# Patient Record
Sex: Male | Born: 1955 | Race: White | Hispanic: No | Marital: Married | Smoking: Former smoker
Health system: Southern US, Community
[De-identification: ages and names within clinical notes are randomized; demographics above are authoritative.]

## PROBLEM LIST (undated history)

## (undated) DIAGNOSIS — I2510 Atherosclerotic heart disease of native coronary artery without angina pectoris: Secondary | ICD-10-CM

## (undated) DIAGNOSIS — Z9889 Other specified postprocedural states: Secondary | ICD-10-CM

## (undated) DIAGNOSIS — I10 Essential (primary) hypertension: Secondary | ICD-10-CM

## (undated) DIAGNOSIS — Z87442 Personal history of urinary calculi: Secondary | ICD-10-CM

## (undated) DIAGNOSIS — I213 ST elevation (STEMI) myocardial infarction of unspecified site: Secondary | ICD-10-CM

## (undated) DIAGNOSIS — K219 Gastro-esophageal reflux disease without esophagitis: Secondary | ICD-10-CM

## (undated) DIAGNOSIS — R112 Nausea with vomiting, unspecified: Secondary | ICD-10-CM

## (undated) DIAGNOSIS — E7800 Pure hypercholesterolemia, unspecified: Secondary | ICD-10-CM

## (undated) HISTORY — PX: GANGLION CYST EXCISION: SHX1691

## (undated) HISTORY — PX: INGUINAL HERNIA REPAIR: SUR1180

## (undated) HISTORY — PX: COLONOSCOPY: SHX174

---

## 2003-11-05 ENCOUNTER — Ambulatory Visit: Payer: Self-pay | Admitting: Family Medicine

## 2005-05-01 ENCOUNTER — Encounter (INDEPENDENT_AMBULATORY_CARE_PROVIDER_SITE_OTHER): Payer: Self-pay | Admitting: Cardiology

## 2005-05-01 ENCOUNTER — Ambulatory Visit (HOSPITAL_COMMUNITY): Admission: RE | Admit: 2005-05-01 | Discharge: 2005-05-01 | Payer: Self-pay

## 2008-01-09 HISTORY — PX: OTHER SURGICAL HISTORY: SHX169

## 2009-12-20 ENCOUNTER — Emergency Department (HOSPITAL_BASED_OUTPATIENT_CLINIC_OR_DEPARTMENT_OTHER)
Admission: EM | Admit: 2009-12-20 | Discharge: 2009-12-20 | Payer: Self-pay | Source: Home / Self Care | Admitting: General Surgery

## 2010-03-21 LAB — BASIC METABOLIC PANEL
BUN: 19 mg/dL (ref 6–23)
CO2: 22 mEq/L (ref 19–32)
GFR calc non Af Amer: 53 mL/min — ABNORMAL LOW (ref >60)
Glucose, Bld: 99 mg/dL (ref 70–99)
Potassium: 3.6 mEq/L (ref 3.5–5.1)

## 2010-03-21 LAB — URINALYSIS, ROUTINE W REFLEX MICROSCOPIC
Bilirubin Urine: NEGATIVE
Glucose, UA: NEGATIVE mg/dL
Ketones, ur: 15 mg/dL — AB
Nitrite: NEGATIVE
pH: 5.5 (ref 5.0–8.0)

## 2010-03-21 LAB — URINE MICROSCOPIC-ADD ON

## 2011-04-08 ENCOUNTER — Emergency Department: Payer: Self-pay | Admitting: *Deleted

## 2011-04-08 LAB — CBC
HCT: 47.1 % (ref 40.0–52.0)
HGB: 16.1 g/dL (ref 13.0–18.0)
MCHC: 34.3 g/dL (ref 32.0–36.0)
MCV: 91 fL (ref 80–100)
Platelet: 236 10*3/uL (ref 150–440)
RBC: 5.17 10*6/uL (ref 4.40–5.90)
RDW: 12.9 % (ref 11.5–14.5)
WBC: 7.9 10*3/uL (ref 3.8–10.6)

## 2011-04-08 LAB — URINALYSIS, COMPLETE
Glucose,UR: NEGATIVE mg/dL (ref 0–75)
Leukocyte Esterase: NEGATIVE
Nitrite: NEGATIVE
Ph: 6 (ref 4.5–8.0)
Protein: 25
RBC,UR: 295 /HPF (ref 0–5)
Squamous Epithelial: NONE SEEN

## 2011-04-08 LAB — BASIC METABOLIC PANEL
Anion Gap: 8 (ref 7–16)
BUN: 22 mg/dL — ABNORMAL HIGH (ref 7–18)
Chloride: 104 mmol/L (ref 98–107)
Co2: 28 mmol/L (ref 21–32)
EGFR (African American): >60
EGFR (Non-African Amer.): >60
Glucose: 96 mg/dL (ref 65–99)

## 2011-04-09 ENCOUNTER — Emergency Department: Payer: Self-pay | Admitting: Emergency Medicine

## 2011-04-09 LAB — LIPASE, BLOOD: Lipase: 62 U/L — ABNORMAL LOW (ref 73–393)

## 2011-04-09 LAB — COMPREHENSIVE METABOLIC PANEL
Albumin: 4.2 g/dL (ref 3.4–5.0)
Alkaline Phosphatase: 87 U/L (ref 50–136)
Anion Gap: 14 (ref 7–16)
BUN: 25 mg/dL — ABNORMAL HIGH (ref 7–18)
Chloride: 103 mmol/L (ref 98–107)
Co2: 22 mmol/L (ref 21–32)
EGFR (African American): 46 — ABNORMAL LOW
SGPT (ALT): 47 U/L
Sodium: 139 mmol/L (ref 136–145)

## 2011-04-09 LAB — CBC
HCT: 45.5 % (ref 40.0–52.0)
RBC: 5.02 10*6/uL (ref 4.40–5.90)
RDW: 12.9 % (ref 11.5–14.5)
WBC: 15.5 10*3/uL — ABNORMAL HIGH (ref 3.8–10.6)

## 2011-04-11 ENCOUNTER — Emergency Department: Payer: Self-pay

## 2011-04-11 LAB — URINALYSIS, COMPLETE
Bacteria: NONE SEEN
Glucose,UR: NEGATIVE mg/dL (ref 0–75)
Leukocyte Esterase: NEGATIVE
Nitrite: NEGATIVE
Ph: 6 (ref 4.5–8.0)
Protein: NEGATIVE
RBC,UR: 8 /HPF (ref 0–5)
Squamous Epithelial: NONE SEEN
WBC UR: 1 /HPF (ref 0–5)

## 2011-04-11 LAB — BASIC METABOLIC PANEL
Anion Gap: 12 (ref 7–16)
Calcium, Total: 9.1 mg/dL (ref 8.5–10.1)
Chloride: 104 mmol/L (ref 98–107)
Co2: 21 mmol/L (ref 21–32)
Creatinine: 1.44 mg/dL — ABNORMAL HIGH (ref 0.60–1.30)
EGFR (African American): >60
Glucose: 96 mg/dL (ref 65–99)

## 2012-05-22 ENCOUNTER — Other Ambulatory Visit: Payer: Self-pay | Admitting: *Deleted

## 2016-01-12 ENCOUNTER — Encounter (HOSPITAL_COMMUNITY): Payer: Self-pay | Admitting: *Deleted

## 2016-01-12 ENCOUNTER — Inpatient Hospital Stay (HOSPITAL_COMMUNITY)
Admission: EM | Admit: 2016-01-12 | Discharge: 2016-01-18 | DRG: 246 | Disposition: A | Discharge status: Home / Self Care | Payer: Managed Care, Other (non HMO) | Attending: Cardiovascular Disease | Admitting: Cardiovascular Disease

## 2016-01-12 ENCOUNTER — Encounter (HOSPITAL_COMMUNITY)
Admission: EM | Disposition: A | Discharge status: Home / Self Care | Payer: Self-pay | Attending: Cardiovascular Disease

## 2016-01-12 DIAGNOSIS — R945 Abnormal results of liver function studies: Secondary | ICD-10-CM

## 2016-01-12 DIAGNOSIS — I2510 Atherosclerotic heart disease of native coronary artery without angina pectoris: Secondary | ICD-10-CM | POA: Diagnosis not present

## 2016-01-12 DIAGNOSIS — I237 Postinfarction angina: Secondary | ICD-10-CM

## 2016-01-12 DIAGNOSIS — I5041 Acute combined systolic (congestive) and diastolic (congestive) heart failure: Secondary | ICD-10-CM | POA: Diagnosis not present

## 2016-01-12 DIAGNOSIS — I255 Ischemic cardiomyopathy: Secondary | ICD-10-CM | POA: Diagnosis present

## 2016-01-12 DIAGNOSIS — Z955 Presence of coronary angioplasty implant and graft: Secondary | ICD-10-CM

## 2016-01-12 DIAGNOSIS — I4891 Unspecified atrial fibrillation: Secondary | ICD-10-CM | POA: Diagnosis not present

## 2016-01-12 DIAGNOSIS — Z6832 Body mass index (BMI) 32.0-32.9, adult: Secondary | ICD-10-CM | POA: Diagnosis not present

## 2016-01-12 DIAGNOSIS — Z79899 Other long term (current) drug therapy: Secondary | ICD-10-CM

## 2016-01-12 DIAGNOSIS — I219 Acute myocardial infarction, unspecified: Secondary | ICD-10-CM | POA: Diagnosis not present

## 2016-01-12 DIAGNOSIS — I2542 Coronary artery dissection: Secondary | ICD-10-CM | POA: Diagnosis not present

## 2016-01-12 DIAGNOSIS — Z880 Allergy status to penicillin: Secondary | ICD-10-CM | POA: Diagnosis not present

## 2016-01-12 DIAGNOSIS — E876 Hypokalemia: Secondary | ICD-10-CM | POA: Diagnosis present

## 2016-01-12 DIAGNOSIS — I10 Essential (primary) hypertension: Secondary | ICD-10-CM | POA: Diagnosis present

## 2016-01-12 DIAGNOSIS — R7989 Other specified abnormal findings of blood chemistry: Secondary | ICD-10-CM | POA: Diagnosis present

## 2016-01-12 DIAGNOSIS — I2111 ST elevation (STEMI) myocardial infarction involving right coronary artery: Secondary | ICD-10-CM | POA: Diagnosis present

## 2016-01-12 DIAGNOSIS — Z7982 Long term (current) use of aspirin: Secondary | ICD-10-CM | POA: Diagnosis not present

## 2016-01-12 DIAGNOSIS — I110 Hypertensive heart disease with heart failure: Secondary | ICD-10-CM | POA: Diagnosis present

## 2016-01-12 DIAGNOSIS — E785 Hyperlipidemia, unspecified: Secondary | ICD-10-CM | POA: Diagnosis not present

## 2016-01-12 DIAGNOSIS — I25110 Atherosclerotic heart disease of native coronary artery with unstable angina pectoris: Secondary | ICD-10-CM | POA: Diagnosis present

## 2016-01-12 DIAGNOSIS — K219 Gastro-esophageal reflux disease without esophagitis: Secondary | ICD-10-CM | POA: Diagnosis present

## 2016-01-12 DIAGNOSIS — I2119 ST elevation (STEMI) myocardial infarction involving other coronary artery of inferior wall: Secondary | ICD-10-CM | POA: Diagnosis present

## 2016-01-12 DIAGNOSIS — I213 ST elevation (STEMI) myocardial infarction of unspecified site: Secondary | ICD-10-CM

## 2016-01-12 DIAGNOSIS — E669 Obesity, unspecified: Secondary | ICD-10-CM

## 2016-01-12 DIAGNOSIS — R079 Chest pain, unspecified: Secondary | ICD-10-CM | POA: Diagnosis present

## 2016-01-12 HISTORY — DX: Gastro-esophageal reflux disease without esophagitis: K21.9

## 2016-01-12 HISTORY — DX: Other specified postprocedural states: Z98.890

## 2016-01-12 HISTORY — DX: Atherosclerotic heart disease of native coronary artery without angina pectoris: I25.10

## 2016-01-12 HISTORY — DX: ST elevation (STEMI) myocardial infarction of unspecified site: I21.3

## 2016-01-12 HISTORY — PX: CARDIAC CATHETERIZATION: SHX172

## 2016-01-12 HISTORY — DX: Pure hypercholesterolemia, unspecified: E78.00

## 2016-01-12 HISTORY — DX: Essential (primary) hypertension: I10

## 2016-01-12 HISTORY — DX: Other specified postprocedural states: R11.2

## 2016-01-12 HISTORY — DX: Personal history of urinary calculi: Z87.442

## 2016-01-12 LAB — COMPREHENSIVE METABOLIC PANEL
ALBUMIN: 4.3 g/dL (ref 3.5–5.0)
ALK PHOS: 91 U/L (ref 38–126)
ALT: 46 U/L (ref 17–63)
ANION GAP: 15 (ref 5–15)
AST: 37 U/L (ref 15–41)
BILIRUBIN TOTAL: 1.2 mg/dL (ref 0.3–1.2)
BUN: 15 mg/dL (ref 6–20)
CALCIUM: 9.6 mg/dL (ref 8.9–10.3)
CO2: 20 mmol/L — AB (ref 22–32)
Chloride: 103 mmol/L (ref 101–111)
Creatinine, Ser: 1.26 mg/dL — ABNORMAL HIGH (ref 0.61–1.24)
GFR calc Af Amer: >60 mL/min (ref >60)
GFR calc non Af Amer: >60 mL/min (ref >60)
GLUCOSE: 128 mg/dL — AB (ref 65–99)
Potassium: 3.1 mmol/L — ABNORMAL LOW (ref 3.5–5.1)
SODIUM: 138 mmol/L (ref 135–145)
TOTAL PROTEIN: 7.3 g/dL (ref 6.5–8.1)

## 2016-01-12 LAB — TROPONIN I
Troponin I: 0.24 ng/mL (ref <0.03)
Troponin I: >65 ng/mL (ref <0.03)

## 2016-01-12 LAB — POCT I-STAT, CHEM 8
BUN: 16 mg/dL (ref 6–20)
CREATININE: 1.1 mg/dL (ref 0.61–1.24)
Calcium, Ion: 1.03 mmol/L — ABNORMAL LOW (ref 1.15–1.40)
Chloride: 104 mmol/L (ref 101–111)
GLUCOSE: 135 mg/dL — AB (ref 65–99)
HEMATOCRIT: 51 % (ref 39.0–52.0)
HEMOGLOBIN: 17.3 g/dL — AB (ref 13.0–17.0)
Potassium: 3 mmol/L — ABNORMAL LOW (ref 3.5–5.1)
Sodium: 141 mmol/L (ref 135–145)
TCO2: 21 mmol/L (ref 0–100)

## 2016-01-12 LAB — POCT I-STAT TROPONIN I: TROPONIN I, POC: 0.26 ng/mL — AB (ref 0.00–0.08)

## 2016-01-12 LAB — DIFFERENTIAL
BASOS PCT: 0 %
Basophils Absolute: 0 10*3/uL (ref 0.0–0.1)
EOS PCT: 1 %
Eosinophils Absolute: 0.2 10*3/uL (ref 0.0–0.7)
LYMPHS PCT: 30 %
Lymphs Abs: 3.5 10*3/uL (ref 0.7–4.0)
MONO ABS: 0.9 10*3/uL (ref 0.1–1.0)
MONOS PCT: 8 %
NEUTROS ABS: 7.2 10*3/uL (ref 1.7–7.7)
Neutrophils Relative %: 61 %

## 2016-01-12 LAB — CBC
HEMATOCRIT: 48.5 % (ref 39.0–52.0)
Hemoglobin: 17.6 g/dL — ABNORMAL HIGH (ref 13.0–17.0)
MCH: 30.6 pg (ref 26.0–34.0)
MCHC: 36.3 g/dL — ABNORMAL HIGH (ref 30.0–36.0)
MCV: 84.3 fL (ref 78.0–100.0)
PLATELETS: 262 10*3/uL (ref 150–400)
RBC: 5.75 MIL/uL (ref 4.22–5.81)
RDW: 12.7 % (ref 11.5–15.5)
WBC: 11.7 10*3/uL — AB (ref 4.0–10.5)

## 2016-01-12 LAB — PROTIME-INR
INR: 0.9
PROTHROMBIN TIME: 12.2 s (ref 11.4–15.2)

## 2016-01-12 LAB — POCT ACTIVATED CLOTTING TIME: ACTIVATED CLOTTING TIME: 147 s

## 2016-01-12 LAB — LIPID PANEL
Cholesterol: 266 mg/dL — ABNORMAL HIGH (ref 0–200)
HDL: 47 mg/dL (ref >40)
LDL CALC: 182 mg/dL — AB (ref 0–99)
Total CHOL/HDL Ratio: 5.7 RATIO
Triglycerides: 183 mg/dL — ABNORMAL HIGH (ref <150)
VLDL: 37 mg/dL (ref 0–40)

## 2016-01-12 LAB — MRSA PCR SCREENING: MRSA by PCR: NEGATIVE

## 2016-01-12 LAB — CG4 I-STAT (LACTIC ACID): Lactic Acid, Venous: 4.26 mmol/L (ref 0.5–1.9)

## 2016-01-12 LAB — MAGNESIUM: MAGNESIUM: 1.8 mg/dL (ref 1.7–2.4)

## 2016-01-12 LAB — TSH: TSH: 0.416 u[IU]/mL (ref 0.350–4.500)

## 2016-01-12 LAB — APTT: aPTT: 30 seconds (ref 24–36)

## 2016-01-12 SURGERY — LEFT HEART CATH AND CORONARY ANGIOGRAPHY
Anesthesia: LOCAL

## 2016-01-12 MED ORDER — FENTANYL CITRATE (PF) 100 MCG/2ML IJ SOLN
100.0000 ug | Freq: Once | INTRAMUSCULAR | Status: AC
  Administered 2016-01-12: 100 ug via INTRAVENOUS

## 2016-01-12 MED ORDER — MIDAZOLAM HCL 2 MG/2ML IJ SOLN
INTRAMUSCULAR | Status: DC | PRN
  Administered 2016-01-12: 1 mg via INTRAVENOUS

## 2016-01-12 MED ORDER — BIVALIRUDIN BOLUS VIA INFUSION - CUPID
INTRAVENOUS | Status: DC | PRN
  Administered 2016-01-12: 85.05 mg via INTRAVENOUS

## 2016-01-12 MED ORDER — ASPIRIN 81 MG PO CHEW
81.0000 mg | CHEWABLE_TABLET | Freq: Every day | ORAL | Status: DC
  Administered 2016-01-13 – 2016-01-18 (×5): 81 mg via ORAL
  Filled 2016-01-12 (×7): qty 1

## 2016-01-12 MED ORDER — METOPROLOL TARTRATE 5 MG/5ML IV SOLN
INTRAVENOUS | Status: AC
  Filled 2016-01-12: qty 5

## 2016-01-12 MED ORDER — SODIUM CHLORIDE 0.9 % IV SOLN: 1.7500 mg/kg/h | Freq: Once | INTRAVENOUS | Status: DC

## 2016-01-12 MED ORDER — AMIODARONE LOAD VIA INFUSION
150.0000 mg | Freq: Once | INTRAVENOUS | Status: AC
  Administered 2016-01-12: 150 mg via INTRAVENOUS
  Filled 2016-01-12: qty 83.34

## 2016-01-12 MED ORDER — NITROGLYCERIN 0.4 MG SL SUBL
0.4000 mg | SUBLINGUAL_TABLET | SUBLINGUAL | Status: DC | PRN
  Administered 2016-01-12: 0.4 mg via SUBLINGUAL

## 2016-01-12 MED ORDER — SODIUM CHLORIDE 0.9% FLUSH
3.0000 mL | Freq: Two times a day (BID) | INTRAVENOUS | Status: DC
  Administered 2016-01-12 – 2016-01-16 (×9): 3 mL via INTRAVENOUS

## 2016-01-12 MED ORDER — ONDANSETRON HCL 4 MG/2ML IJ SOLN
4.0000 mg | Freq: Four times a day (QID) | INTRAMUSCULAR | Status: DC | PRN
  Administered 2016-01-12: 4 mg via INTRAVENOUS
  Filled 2016-01-12: qty 2

## 2016-01-12 MED ORDER — BIVALIRUDIN 250 MG IV SOLR
INTRAVENOUS | Status: AC
  Filled 2016-01-12: qty 250

## 2016-01-12 MED ORDER — NITROGLYCERIN IN D5W 200-5 MCG/ML-% IV SOLN
INTRAVENOUS | Status: DC | PRN
  Administered 2016-01-12: 10 ug/min via INTRAVENOUS

## 2016-01-12 MED ORDER — AMIODARONE HCL IN DEXTROSE 360-4.14 MG/200ML-% IV SOLN
30.0000 mg/h | INTRAVENOUS | Status: DC
  Administered 2016-01-13: 30 mg/h via INTRAVENOUS
  Filled 2016-01-12: qty 200

## 2016-01-12 MED ORDER — DILTIAZEM HCL 100 MG IV SOLR
5.0000 mg/h | INTRAVENOUS | Status: DC
  Filled 2016-01-12: qty 100

## 2016-01-12 MED ORDER — FENTANYL CITRATE (PF) 100 MCG/2ML IJ SOLN
INTRAMUSCULAR | Status: DC | PRN
  Administered 2016-01-12: 25 ug via INTRAVENOUS

## 2016-01-12 MED ORDER — POTASSIUM CHLORIDE CRYS ER 20 MEQ PO TBCR
40.0000 meq | EXTENDED_RELEASE_TABLET | Freq: Two times a day (BID) | ORAL | Status: DC
  Administered 2016-01-12 – 2016-01-13 (×4): 40 meq via ORAL
  Filled 2016-01-12 (×5): qty 2

## 2016-01-12 MED ORDER — DILTIAZEM LOAD VIA INFUSION
10.0000 mg | Freq: Once | INTRAVENOUS | Status: AC
  Administered 2016-01-12: 10 mg via INTRAVENOUS
  Filled 2016-01-12: qty 10

## 2016-01-12 MED ORDER — SODIUM CHLORIDE 0.9 % IV SOLN
INTRAVENOUS | Status: DC | PRN
  Administered 2016-01-12: 1.75 mg/kg/h via INTRAVENOUS
  Administered 2016-01-12: 07:00:00

## 2016-01-12 MED ORDER — LISINOPRIL 2.5 MG PO TABS
2.5000 mg | ORAL_TABLET | Freq: Every day | ORAL | Status: DC
  Administered 2016-01-12 – 2016-01-14 (×3): 2.5 mg via ORAL
  Filled 2016-01-12 (×4): qty 1

## 2016-01-12 MED ORDER — ALUM & MAG HYDROXIDE-SIMETH 200-200-20 MG/5ML PO SUSP
30.0000 mL | ORAL | Status: DC | PRN
  Administered 2016-01-12: 30 mL via ORAL
  Filled 2016-01-12: qty 30

## 2016-01-12 MED ORDER — TICAGRELOR 90 MG PO TABS
90.0000 mg | ORAL_TABLET | Freq: Two times a day (BID) | ORAL | Status: DC
  Administered 2016-01-12 – 2016-01-18 (×13): 90 mg via ORAL
  Filled 2016-01-12 (×13): qty 1

## 2016-01-12 MED ORDER — FENTANYL CITRATE (PF) 100 MCG/2ML IJ SOLN
INTRAMUSCULAR | Status: AC
  Filled 2016-01-12: qty 2

## 2016-01-12 MED ORDER — MIDAZOLAM HCL 2 MG/2ML IJ SOLN
INTRAMUSCULAR | Status: DC | PRN
  Administered 2016-01-12: 2 mg via INTRAVENOUS

## 2016-01-12 MED ORDER — SODIUM CHLORIDE 0.9 % IV SOLN
1.7500 mg/kg/h | INTRAVENOUS | Status: AC
  Administered 2016-01-12 (×3): 1.75 mg/kg/h via INTRAVENOUS
  Filled 2016-01-12 (×6): qty 250

## 2016-01-12 MED ORDER — NITROGLYCERIN 1 MG/10 ML FOR IR/CATH LAB
INTRA_ARTERIAL | Status: DC | PRN
  Administered 2016-01-12 (×2): 200 ug via INTRACORONARY

## 2016-01-12 MED ORDER — METOPROLOL TARTRATE 12.5 MG HALF TABLET
12.5000 mg | ORAL_TABLET | Freq: Two times a day (BID) | ORAL | Status: DC
Start: 2016-01-12 — End: 2016-01-13
  Administered 2016-01-12 – 2016-01-13 (×3): 12.5 mg via ORAL
  Filled 2016-01-12 (×3): qty 1

## 2016-01-12 MED ORDER — AMIODARONE HCL IN DEXTROSE 360-4.14 MG/200ML-% IV SOLN
INTRAVENOUS | Status: AC
  Administered 2016-01-12: 150 mg via INTRAVENOUS
  Filled 2016-01-12: qty 200

## 2016-01-12 MED ORDER — ACETAMINOPHEN 325 MG PO TABS
650.0000 mg | ORAL_TABLET | ORAL | Status: DC | PRN
  Administered 2016-01-13: 650 mg via ORAL
  Filled 2016-01-12: qty 2

## 2016-01-12 MED ORDER — NITROGLYCERIN IN D5W 200-5 MCG/ML-% IV SOLN
INTRAVENOUS | Status: AC
  Filled 2016-01-12: qty 250

## 2016-01-12 MED ORDER — IOPAMIDOL (ISOVUE-370) INJECTION 76%
INTRAVENOUS | Status: AC
  Filled 2016-01-12: qty 100

## 2016-01-12 MED ORDER — DILTIAZEM HCL 100 MG IV SOLR
5.0000 mg/h | INTRAVENOUS | Status: DC
  Administered 2016-01-12: 15 mg/h via INTRAVENOUS
  Administered 2016-01-12: 5 mg/h via INTRAVENOUS
  Filled 2016-01-12 (×3): qty 100

## 2016-01-12 MED ORDER — LIDOCAINE HCL (PF) 1 % IJ SOLN
INTRAMUSCULAR | Status: AC
  Filled 2016-01-12: qty 30

## 2016-01-12 MED ORDER — PANTOPRAZOLE SODIUM 40 MG PO TBEC
40.0000 mg | DELAYED_RELEASE_TABLET | Freq: Every day | ORAL | Status: DC
Start: 2016-01-12 — End: 2016-01-18
  Administered 2016-01-12 – 2016-01-18 (×7): 40 mg via ORAL
  Filled 2016-01-12 (×7): qty 1

## 2016-01-12 MED ORDER — SODIUM CHLORIDE 0.9 % IV SOLN
INTRAVENOUS | Status: DC
  Administered 2016-01-12: 09:00:00 via INTRAVENOUS

## 2016-01-12 MED ORDER — LIDOCAINE HCL (PF) 1 % IJ SOLN
INTRAMUSCULAR | Status: DC | PRN
  Administered 2016-01-12: 30 mL via INTRADERMAL

## 2016-01-12 MED ORDER — SODIUM CHLORIDE 0.9% FLUSH: 3.0000 mL | INTRAVENOUS | Status: DC | PRN

## 2016-01-12 MED ORDER — MORPHINE SULFATE (PF) 2 MG/ML IV SOLN
2.0000 mg | INTRAVENOUS | Status: DC | PRN
  Administered 2016-01-12 – 2016-01-17 (×2): 2 mg via INTRAVENOUS
  Filled 2016-01-12 (×2): qty 1

## 2016-01-12 MED ORDER — TICAGRELOR 90 MG PO TABS
ORAL_TABLET | ORAL | Status: AC
  Filled 2016-01-12: qty 2

## 2016-01-12 MED ORDER — IOPAMIDOL (ISOVUE-370) INJECTION 76%
INTRAVENOUS | Status: AC
  Filled 2016-01-12: qty 125

## 2016-01-12 MED ORDER — FENTANYL CITRATE (PF) 100 MCG/2ML IJ SOLN
INTRAMUSCULAR | Status: DC | PRN
  Administered 2016-01-12: 50 ug via INTRAVENOUS

## 2016-01-12 MED ORDER — METOPROLOL TARTRATE 5 MG/5ML IV SOLN
INTRAVENOUS | Status: DC | PRN
  Administered 2016-01-12 (×2): 2.5 mg via INTRAVENOUS

## 2016-01-12 MED ORDER — IOPAMIDOL (ISOVUE-370) INJECTION 76%
INTRAVENOUS | Status: AC
  Filled 2016-01-12: qty 50

## 2016-01-12 MED ORDER — AMIODARONE HCL IN DEXTROSE 360-4.14 MG/200ML-% IV SOLN
60.0000 mg/h | INTRAVENOUS | Status: DC
  Administered 2016-01-12 (×2): 60 mg/h via INTRAVENOUS
  Filled 2016-01-12: qty 200

## 2016-01-12 MED ORDER — HEPARIN (PORCINE) IN NACL 2-0.9 UNIT/ML-% IJ SOLN
INTRAMUSCULAR | Status: DC | PRN
  Administered 2016-01-12: 500 mL

## 2016-01-12 MED ORDER — MIDAZOLAM HCL 2 MG/2ML IJ SOLN
INTRAMUSCULAR | Status: AC
  Filled 2016-01-12: qty 2

## 2016-01-12 MED ORDER — NITROGLYCERIN IN D5W 200-5 MCG/ML-% IV SOLN
0.0000 ug/min | INTRAVENOUS | Status: DC
  Administered 2016-01-12: 30 ug/min via INTRAVENOUS
  Administered 2016-01-12: 80 ug/min via INTRAVENOUS
  Filled 2016-01-12: qty 250

## 2016-01-12 MED ORDER — AMIODARONE IV BOLUS ONLY 150 MG/100ML
150.0000 mg | Freq: Once | INTRAVENOUS | Status: AC
  Administered 2016-01-12: 150 mg via INTRAVENOUS

## 2016-01-12 MED ORDER — HEPARIN (PORCINE) IN NACL 2-0.9 UNIT/ML-% IJ SOLN
INTRAMUSCULAR | Status: DC | PRN
  Administered 2016-01-12: 1000 mL

## 2016-01-12 MED ORDER — TICAGRELOR 90 MG PO TABS
ORAL_TABLET | ORAL | Status: DC | PRN
  Administered 2016-01-12: 180 mg via ORAL

## 2016-01-12 MED ORDER — HEPARIN SODIUM (PORCINE) 5000 UNIT/ML IJ SOLN
4000.0000 [IU] | Freq: Once | INTRAMUSCULAR | Status: AC
  Administered 2016-01-12: 4000 [IU] via INTRAVENOUS

## 2016-01-12 MED ORDER — NITROGLYCERIN 1 MG/10 ML FOR IR/CATH LAB
INTRA_ARTERIAL | Status: AC
  Filled 2016-01-12: qty 10

## 2016-01-12 MED ORDER — HEPARIN (PORCINE) IN NACL 2-0.9 UNIT/ML-% IJ SOLN
INTRAMUSCULAR | Status: AC
  Filled 2016-01-12: qty 1500

## 2016-01-12 MED ORDER — ATORVASTATIN CALCIUM 80 MG PO TABS
80.0000 mg | ORAL_TABLET | Freq: Every day | ORAL | Status: DC
  Administered 2016-01-12: 80 mg via ORAL
  Filled 2016-01-12: qty 1

## 2016-01-12 MED ORDER — SODIUM CHLORIDE 0.9 % IV SOLN: 250.0000 mL | INTRAVENOUS | Status: DC | PRN

## 2016-01-12 MED ORDER — ATROPINE SULFATE 1 MG/10ML IJ SOSY
PREFILLED_SYRINGE | INTRAMUSCULAR | Status: AC
  Filled 2016-01-12: qty 10

## 2016-01-12 SURGICAL SUPPLY — 24 items
BALLN EMERGE MR 2.5X12 (BALLOONS) ×2
BALLN EUPHORA RX 3.0X15 (BALLOONS) ×2
BALLN ~~LOC~~ MOZEC 3.5X10 (BALLOONS) ×2
BALLN ~~LOC~~ MOZEC 4.0X10 (BALLOONS) ×2
BALLOON EMERGE MR 2.5X12 (BALLOONS) ×1 IMPLANT
BALLOON EUPHORA RX 3.0X15 (BALLOONS) ×1 IMPLANT
BALLOON ~~LOC~~ MOZEC 3.5X10 (BALLOONS) ×1 IMPLANT
BALLOON ~~LOC~~ MOZEC 4.0X10 (BALLOONS) ×1 IMPLANT
CATH INFINITI 5FR ANG PIGTAIL (CATHETERS) ×2 IMPLANT
CATH INFINITI 5FR JL4 (CATHETERS) ×2 IMPLANT
CATH VISTA GUIDE 6FR JR4 (CATHETERS) ×2 IMPLANT
KIT ENCORE 26 ADVANTAGE (KITS) ×2 IMPLANT
KIT HEART LEFT (KITS) ×2 IMPLANT
PACK CARDIAC CATHETERIZATION (CUSTOM PROCEDURE TRAY) ×2 IMPLANT
SHEATH PINNACLE 6F 10CM (SHEATH) ×2 IMPLANT
STENT RESOLUTE ONYX 3.0X22 (Permanent Stent) ×2 IMPLANT
STENT RESOLUTE ONYX 3.5X15 (Permanent Stent) ×2 IMPLANT
STENT RESOLUTE ONYX 3.5X18 (Permanent Stent) ×2 IMPLANT
STOPCOCK MORSE 400PSI 3WAY (MISCELLANEOUS) ×2 IMPLANT
SYR MEDRAD MARK V 150ML (SYRINGE) ×2 IMPLANT
TRANSDUCER W/STOPCOCK (MISCELLANEOUS) ×2 IMPLANT
TUBING CIL FLEX 10 FLL-RA (TUBING) ×2 IMPLANT
WIRE EMERALD 3MM-J .035X150CM (WIRE) ×2 IMPLANT
WIRE PT2 MS 185 (WIRE) ×2 IMPLANT

## 2016-01-12 NOTE — Care Management Note (Signed)
Case Management Note  Patient Details  Name: Shawn Elliott MRN: [id number] Date of Birth: 1955-07-09  Subjective/Objective:    Adm w mi                Action/Plan:lives w wife. Wife states they do have ins.   Expected Discharge Date:                  Expected Discharge Plan:  Home/Self Care  In-House Referral:     Discharge planning Services  CM Consult, Medication Assistance  Post Acute Care Choice:    Choice offered to:     DME Arranged:    DME Agency:     HH Arranged:    HH Agency:     Status of Service:  Completed, signed off  If discussed at [employer] of Stay Meetings, dates discussed:    Additional Comments: gave pt 30day free and copay card for brilinta. Wife states they have ins. No in computer yet  Hanley Hays, RN 01/12/2016, 10:30 AM

## 2016-01-12 NOTE — ED Notes (Signed)
SHOWN DR.NANAVATI LACTIC ACID -CG4-SHOWN I STAT TROPONI TO DR.NANAVATI ALSO.

## 2016-01-12 NOTE — Progress Notes (Signed)
Pt converted to NSR at 1910, shortly after second Amio bolus. Diltiazem discontinued per Dr. Lonn Georgia orders.

## 2016-01-12 NOTE — H&P (Signed)
    Physician History and Physical    Shawn Elliott MRN: [id number] DOB/AGE: [birth year] 60 y.o. Admit date: 01/12/2016  Primary Cardiologist: New  HPI: 61 yo with history of HTN and hyperlipidemia presented with an inferolateral STEMI.  Patient had stress test that was negative per his report 3-4 years ago but no other prior cardiac evaluation.  He had had no chest pain prior to this morning.  Woke up about 12:30 am with substernal chest discomfort, thought GERD so took omeprazole.  Pain was not severe so able to go back to sleep.  Would up a couple hours later with very severe pain. He ended up calling EMS when the pain did not resolve.  ECG was done, showed inferolateral STEMI.  He was transported to the ER and from there to the cath lab.   Generally has good exercise tolerance with no limitations.   Review of systems complete and found to be negative unless listed above   PMH: 1. HTN 2. Hyperlipidemia 3. GERD  Family History: No premature CAD  Social History   Social History  . Marital status: Married    Spouse name: N/A  . Number of children: N/A  . Years of education: N/A   Occupational History  . Not on file.   Social History Main Topics  . Smoking status: Nonsmoker  . Smokeless tobacco: Not on file  . Alcohol use Not on file  . Drug use: Unknown  . Sexual activity: Not on file   Other Topics Concern  . Not on file   Social History Narrative  . No narrative on file    Medications: Omeprazole Lovastatin olmesartan-HCTZ  Physical Exam: There were no vitals taken for this visit.  General: Uncomfortable, diaphoretic Neck: Thick, no JVD, no thyromegaly or thyroid nodule.  Lungs: Clear to auscultation bilaterally with normal respiratory effort. CV: Nondisplaced PMI.  Heart regular S1/S2, no S3/S4, no murmur.  No peripheral edema.  No carotid bruit.  Normal pedal pulses.  Abdomen: Soft, nontender, no hepatosplenomegaly, no distention.  Skin: Intact without  lesions or rashes.  Neurologic: Alert and oriented x 3.  Psych: Normal affect. Extremities: No clubbing or cyanosis.  HEENT: Normal.   Labs:   Lab Results  Component Value Date   WBC 15.5 (H) 04/09/2011   HGB 15.5 04/09/2011   HCT 45.5 04/09/2011   MCV 91 04/09/2011   PLT 235 04/09/2011    EKG: NSR, 3 mm inferior ST elevation, 2 mm anterolateral ST elevation  ASSESSMENT AND PLAN: 61 yo with history of HTN and hyperlipidemia presented with an inferolateral STEMI.  1. Inferolateral STEMI: ASA 81, heparin bolus, transfer to cath lab for emergent angiography and PCI.  2. HTN: SBP 170s currently in setting of pain.  For now, control with NTG gtt as needed, will need to resume antihypertensives after procedure.  3. Hyperlipidemia: High dose statin.   Signed: Marca Ancona 01/12/2016, 5:43 AM

## 2016-01-12 NOTE — ED Notes (Signed)
Talked with Selena Batten in main lab to advise orders are now in.

## 2016-01-12 NOTE — Progress Notes (Signed)
EKG CRITICAL VALUE     12 lead EKG performed.  Critical value noted.  Wendie Chess, RN notified.   Reinhardt Licausi, CCT 01/12/2016 10:10 AM

## 2016-01-12 NOTE — Progress Notes (Signed)
  Amiodarone Drug - Drug Interaction Consult Note  Recommendations: Monitor for bradycardia, AV block and myocardial depression with concomitant amiodarone, metoprolol, and diltiazem.  Monitor for signs and symptoms of myopathy with amiodarone and atorvastatin.   Amiodarone is metabolized by the cytochrome P450 system and therefore has the potential to cause many drug interactions. Amiodarone has an average plasma half-life of 50 days (range 20 to 100 days).   There is potential for drug interactions to occur several weeks or months after stopping treatment and the onset of drug interactions may be slow after initiating amiodarone.   [x]  Statins: Increased risk of myopathy. Simvastatin- restrict dose to 20mg  daily. Other statins: counsel patients to report any muscle pain or weakness immediately.  []  Anticoagulants: Amiodarone can increase anticoagulant effect. Consider warfarin dose reduction. Patients should be monitored closely and the dose of anticoagulant altered accordingly, remembering that amiodarone levels take several weeks to stabilize.  []  Antiepileptics: Amiodarone can increase plasma concentration of phenytoin, the dose should be reduced. Note that small changes in phenytoin dose can result in large changes in levels. Monitor patient and counsel on signs of toxicity.  [x]  Beta blockers: increased risk of bradycardia, AV block and myocardial depression. Sotalol - avoid concomitant use.  [x]   Calcium channel blockers (diltiazem and verapamil): increased risk of bradycardia, AV block and myocardial depression.  []   Cyclosporine: Amiodarone increases levels of cyclosporine. Reduced dose of cyclosporine is recommended.  []  Digoxin dose should be halved when amiodarone is started.  []  Diuretics: increased risk of cardiotoxicity if hypokalemia occurs.  []  Oral hypoglycemic agents (glyburide, glipizide, glimepiride): increased risk of hypoglycemia. Patient's glucose levels should be  monitored closely when initiating amiodarone therapy.   []  Drugs that prolong the QT interval:  Torsades de pointes risk may be increased with concurrent use - avoid if possible.  Monitor QTc, also keep magnesium/potassium WNL if concurrent therapy can't be avoided. Marland Kitchen Antibiotics: e.g. fluoroquinolones, erythromycin. . Antiarrhythmics: e.g. quinidine, procainamide, disopyramide, sotalol. . Antipsychotics: e.g. phenothiazines, haloperidol.  . Lithium, tricyclic antidepressants, and methadone. Thank You,  Fayne Norrie  01/12/2016 7:13 PM

## 2016-01-12 NOTE — Progress Notes (Addendum)
    Critical Care Note   Called to see the patient at 5:28 PM by [facility] because of ongoing chest discomfort, nausea, vomiting, and ashen appearance.  Subjective: States he has had lingering pressure in the chest since last evening. Following PCI the discomfort has waxed and waned throughout the day but never completely resolved. The discomfort is in no way near as severe as on presentation with acute inferior ST elevation. It is moderate in severity grade 2-3/10 in intensity. He denies dyspnea. He is tired of lying on his back. Sheath pulled 3 hours ago.  Objective: BP 114/70 mmHg; Heart rate 90-130 bpm; rhythm is atrial fibrillation with rapid ventricular response; respiratory rate is 15 and nonlabored. Skin is warm and dry. Skin is pink. Chest is clear to auscultation and percussion. Cardiac exam reveals a rapid irregularly irregular rhythm. No murmur or gallop is heard. Abdomen is soft. Extremities reveal no edema. Right femoral access site is without evidence of hematoma or bleeding. The patient is neurologically intact.  The cardiac catheterization due to the images were personally reviewed: Initial total occlusion of the right coronary was nicely recanalized with 3 stents being placed and TIMI grade 3 flow noted post procedure. Myocardial blush was significantly decreased suggesting microembolization to a large distal anterior myocardial bed. Additionally angiography demonstrated segmental 85% stenosis in the proximal to mid LAD and 80-90% segmental stenosis in the proximal to mid circumflex. Ventriculography demonstrated severe hypokinesis throughout the entire inferior and inferoapical myocardium. Estimated ejection fraction 40%. LVEDP was 20 mmHg.  Diagnostic Diagram     Post-Intervention Diagram      ECG performed acutely this evening reveals Atrial fibrillation with rapid ventricular response, inferior Q waves with persistent ST elevation 2, 3, aVF, and V4 through V6.  When compared to the prior tracing immediately post-cath at 8:33 AM today, no significant change is noted with the exception of a slightly slower heart rate.  ASSESSMENT:  1. Atrial fibrillation with rapid ventricular response, likely contributing to ongoing chest discomfort in this patient with multivessel coronary disease as outlined above. IV diltiazem is not adequately controlling the rate. 2. Severe three-vessel coronary disease with acute intervention on the right coronary in the setting of inferior ST elevation MI. Greater than 80% stenosis in both the LAD and circumflex as outlined above. 3. Acute on chronic diastolic heart failure with elevated end-diastolic pressure. EF estimated at 40% acutely. 4. Ongoing chest discomfort/ Post Infarction Angina pectoris: related to poor rate control, residual LAD and circumflex coronary disease, microcirculatory obstruction as denoted by persistent ST elevation in the inferolateral leads.  RECOMMENDATIONS:  1. IV amiodarone to improve rate control and hopefully convert to normal sinus rhythm. I believe this will greatly improve the patient's complaints of chest discomfort. 2. With the addition of IV amiodarone, diltiazem should be weaned and discontinued. 3. If persisting chest discomfort with better rate control, will need to consider early recath and interventional non-culprit territories (LAD and circumflex) 4. Will sign the patient out to the on call physician.   Time spent 45 minutes in critical care setting

## 2016-01-12 NOTE — Progress Notes (Signed)
Pt c/o ongoing chest pressure unrelieved by Nitroglycerin gtt titration to and morphine administration. Morphine made patient nauseous and was not repeated. Pressure was intermittent throughout the day but became worse and constant tonight. BP stable. Pt in new onset Afib during cath lab with Diltiazem gtt infusing. Philomena Course, PA paged. Dr Katrinka Blazing to come assess pt

## 2016-01-12 NOTE — ED Triage Notes (Signed)
Pt to ED by [facility] as a Code STEMI. Pt woke up at midnight with chest pain. Pt felt that pain was indigestion, took medication and went back to bed. Pt was woken up again by worsening chest pain. EMS noted elevation in V2, V3, and aVf. Pt does not have a cardiologist. EMS gave 324 mg asa, 4mg  zofran, nitro x 1 with improvement. Dr.Mclean at bedside.

## 2016-01-13 DIAGNOSIS — R7989 Other specified abnormal findings of blood chemistry: Secondary | ICD-10-CM

## 2016-01-13 DIAGNOSIS — E785 Hyperlipidemia, unspecified: Secondary | ICD-10-CM

## 2016-01-13 DIAGNOSIS — E669 Obesity, unspecified: Secondary | ICD-10-CM

## 2016-01-13 DIAGNOSIS — R945 Abnormal results of liver function studies: Secondary | ICD-10-CM

## 2016-01-13 LAB — CBC
HEMATOCRIT: 40.5 % (ref 39.0–52.0)
HEMOGLOBIN: 14.2 g/dL (ref 13.0–17.0)
MCH: 30 pg (ref 26.0–34.0)
MCHC: 35.1 g/dL (ref 30.0–36.0)
MCV: 85.4 fL (ref 78.0–100.0)
Platelets: 237 10*3/uL (ref 150–400)
RBC: 4.74 MIL/uL (ref 4.22–5.81)
RDW: 12.9 % (ref 11.5–15.5)
WBC: 17.1 10*3/uL — AB (ref 4.0–10.5)

## 2016-01-13 LAB — BASIC METABOLIC PANEL
ANION GAP: 11 (ref 5–15)
BUN: 15 mg/dL (ref 6–20)
CALCIUM: 8.5 mg/dL — AB (ref 8.9–10.3)
CO2: 24 mmol/L (ref 22–32)
Chloride: 103 mmol/L (ref 101–111)
Creatinine, Ser: 1.21 mg/dL (ref 0.61–1.24)
GLUCOSE: 139 mg/dL — AB (ref 65–99)
POTASSIUM: 3.3 mmol/L — AB (ref 3.5–5.1)
Sodium: 138 mmol/L (ref 135–145)

## 2016-01-13 LAB — HEPATIC FUNCTION PANEL
ALBUMIN: 3.2 g/dL — AB (ref 3.5–5.0)
ALT: 76 U/L — AB (ref 17–63)
AST: 242 U/L — AB (ref 15–41)
Alkaline Phosphatase: 62 U/L (ref 38–126)
Bilirubin, Direct: 0.1 mg/dL (ref 0.1–0.5)
Indirect Bilirubin: 0.8 mg/dL (ref 0.3–0.9)
Total Bilirubin: 0.9 mg/dL (ref 0.3–1.2)
Total Protein: 5.6 g/dL — ABNORMAL LOW (ref 6.5–8.1)

## 2016-01-13 LAB — HEMOGLOBIN A1C
Hgb A1c MFr Bld: 5.4 % (ref 4.8–5.6)
MEAN PLASMA GLUCOSE: 108 mg/dL

## 2016-01-13 LAB — POCT ACTIVATED CLOTTING TIME: Activated Clotting Time: 692 seconds

## 2016-01-13 LAB — HEPARIN LEVEL (UNFRACTIONATED): Heparin Unfractionated: 0.24 IU/mL — ABNORMAL LOW (ref 0.30–0.70)

## 2016-01-13 MED ORDER — AMIODARONE HCL 200 MG PO TABS
400.0000 mg | ORAL_TABLET | Freq: Two times a day (BID) | ORAL | Status: DC
  Administered 2016-01-13 – 2016-01-15 (×5): 400 mg via ORAL
  Filled 2016-01-13 (×5): qty 2

## 2016-01-13 MED ORDER — HEPARIN (PORCINE) IN NACL 100-0.45 UNIT/ML-% IJ SOLN
1550.0000 [IU]/h | INTRAMUSCULAR | Status: DC
  Administered 2016-01-13: 1450 [IU]/h via INTRAVENOUS
  Administered 2016-01-13: 1550 [IU]/h via INTRAVENOUS
  Filled 2016-01-13 (×2): qty 250

## 2016-01-13 MED ORDER — METOPROLOL TARTRATE 25 MG PO TABS
25.0000 mg | ORAL_TABLET | Freq: Two times a day (BID) | ORAL | Status: DC
  Administered 2016-01-13 – 2016-01-14 (×3): 25 mg via ORAL
  Filled 2016-01-13 (×4): qty 1

## 2016-01-13 MED ORDER — HEPARIN BOLUS VIA INFUSION
4000.0000 [IU] | Freq: Once | INTRAVENOUS | Status: AC
  Administered 2016-01-13: 4000 [IU] via INTRAVENOUS
  Filled 2016-01-13: qty 4000

## 2016-01-13 MED ORDER — ATORVASTATIN CALCIUM 80 MG PO TABS
80.0000 mg | ORAL_TABLET | Freq: Every day | ORAL | Status: DC
  Administered 2016-01-14 – 2016-01-17 (×4): 80 mg via ORAL
  Filled 2016-01-13 (×4): qty 1

## 2016-01-13 MED ORDER — POTASSIUM CHLORIDE CRYS ER 20 MEQ PO TBCR
40.0000 meq | EXTENDED_RELEASE_TABLET | Freq: Once | ORAL | Status: AC
  Administered 2016-01-13: 40 meq via ORAL
  Filled 2016-01-13: qty 2

## 2016-01-13 NOTE — Progress Notes (Signed)
CARDIAC REHAB PHASE I   PRE:  Rate/Rhythm: 84 SR    BP: sitting 121/84    SaO2: 98 RA  MODE:  Ambulation: 470 ft   POST:  Rate/Rhythm: 95 SR    BP: sitting 135/81     SaO2: 99 RA  Tolerated well, no c/o, feels good. To recliner. Began ed with pt and wife. Understands importance of Brilinta. Interested in CRPII and will send referral to [place] [facility]. Gave diet sheet to begin reading. Pt can walk over weekend. Will f/u.  [id number]  Harriet Masson CES, ACSM 01/13/2016 2:18 PM

## 2016-01-13 NOTE — Progress Notes (Signed)
ANTICOAGULATION CONSULT NOTE - Initial Consult  Pharmacy Consult for heparin Indication: atrial fibrillation  Allergies  Allergen Reactions  . Penicillins Hives    Patient Measurements: Height: 6\' 2"  (188 cm) Weight: 252 lb 3.2 oz (114.4 kg) IBW/kg (Calculated) : 82.2 Heparin Dosing Weight: 106 kg  Vital Signs: Temp: 98.4 F (36.9 C) (01/05 0406) Temp Source: Oral (01/05 0406) BP: 107/66 (01/05 0600) Pulse Rate: 75 (01/05 0600)  Labs:  Recent Labs  01/12/16 0535 01/12/16 0541 01/12/16 1503 01/13/16 0232  HGB 17.6* 17.3*  --  14.2  HCT 48.5 51.0  --  40.5  PLT 262  --   --  237  APTT 30  --   --   --   LABPROT 12.2  --   --   --   INR 0.90  --   --   --   CREATININE 1.26* 1.10  --  1.21  TROPONINI 0.24*  --  >65.00*  --     Estimated Creatinine Clearance: 88.4 mL/min (by C-G formula based on SCr of 1.21 mg/dL).   Assessment: 61yo M presented with STEMI, s/p PCI developed afib. Pharmacy has been consulted to dose heparin. CBC wnl stable, no bleeding noted.  Goal of Therapy:  Heparin level 0.3-0.7 units/ml Monitor platelets by anticoagulation protocol: Yes   Plan:  Give 4000 units bolus x 1 Start heparin infusion at 1450 units/hr Check anti-Xa level in 6 hours and daily while on heparin Continue to monitor H&H and platelets   Mackie Pai, PharmD PGY1 Pharmacy Resident Pager: [phone] 01/13/2016 6:28 AM

## 2016-01-13 NOTE — Progress Notes (Signed)
ANTICOAGULATION CONSULT NOTE -  Consult  Pharmacy Consult for heparin Indication: atrial fibrillation  Allergies  Allergen Reactions  . Penicillins Hives    Patient Measurements: Height: 6\' 2"  (188 cm) Weight: 252 lb 3.2 oz (114.4 kg) IBW/kg (Calculated) : 82.2 Heparin Dosing Weight: 106 kg  Vital Signs: Temp: 98 F (36.7 C) (01/05 1613) Temp Source: Oral (01/05 1613) BP: 107/66 (01/05 0600) Pulse Rate: 75 (01/05 0600)  Labs:  Recent Labs  01/12/16 0535 01/12/16 0541 01/12/16 1503 01/13/16 0232 01/13/16 1552  HGB 17.6* 17.3*  --  14.2  --   HCT 48.5 51.0  --  40.5  --   PLT 262  --   --  237  --   APTT 30  --   --   --   --   LABPROT 12.2  --   --   --   --   INR 0.90  --   --   --   --   HEPARINUNFRC  --   --   --   --  0.24*  CREATININE 1.26* 1.10  --  1.21  --   TROPONINI 0.24*  --  >65.00*  --   --     Estimated Creatinine Clearance: 88.4 mL/min (by C-G formula based on SCr of 1.21 mg/dL).   Assessment: 61yo M presented with STEMI, s/p PCI developed afib.  Pharmacy has been consulted to dose heparin. CBC wnl stable, no bleeding noted. Heparin drip 1450 uts/hr HL 0.24 less than goal.    Goal of Therapy:  Heparin level 0.3-0.7 units/ml Monitor platelets by anticoagulation protocol: Yes   Plan:  Increase Heparin 1550 uts/hr Daily HL, CBC F/U plan for oral AC  Leota Sauers Pharm.D. CPP, BCPS Clinical Pharmacist [phone] 01/13/2016 5:34 PM

## 2016-01-13 NOTE — Progress Notes (Addendum)
Subjective:  Day 1 s/p Inferior STEMI  Objective:   Vital Signs : Vitals:   01/13/16 0429 01/13/16 0500 01/13/16 0600 01/13/16 0800  BP: 111/65 106/67 107/66   Pulse: 74 72 75   Resp: (!) 36 (!) 22 (!) 25   Temp:    98.4 F (36.9 C)  TempSrc:    Oral  SpO2: 94% 94% 94%   Weight:      Height:        Intake/Output from previous day:  Intake/Output Summary (Last 24 hours) at 01/13/16 0814 Last data filed at 01/13/16 0430  Gross per 24 hour  Intake          2651.98 ml  Output              700 ml  Net          1951.98 ml    I/O since admission: +1952  Wt Readings from Last 3 Encounters:  01/12/16 252 lb 3.2 oz (114.4 kg)    Medications: . aspirin  81 mg Oral Daily  . atorvastatin  80 mg Oral q1800  . lisinopril  2.5 mg Oral Daily  . metoprolol tartrate  12.5 mg Oral BID  . pantoprazole  40 mg Oral Daily  . potassium chloride  40 mEq Oral BID  . sodium chloride flush  3 mL Intravenous Q12H  . ticagrelor  90 mg Oral BID    . sodium chloride Stopped (01/12/16 2330)  . amiodarone 30 mg/hr (01/13/16 0800)  . diltiazem (CARDIZEM) infusion Stopped (01/12/16 1930)  . heparin 1,450 Units/hr (01/13/16 0813)  . nitroGLYCERIN Stopped (01/13/16 6979)    Physical Exam:   General appearance: alert, cooperative and no distress Neck: no adenopathy, no carotid bruit, no JVD, thyroid not enlarged, symmetric, no tenderness/mass/nodules and thick neck Lungs: clear to auscultation bilaterally Heart: regular rate and rhythm and faint 1/6 sem; no s3 Abdomen: soft, non-tender; bowel sounds normal; no masses,  no organomegaly Extremities: no edema, redness or tenderness in the calves or thighs Pulses: 2+ and symmetric; R groin cath site stable Skin: Skin color, texture, turgor normal. No rashes or lesions Neurologic: Grossly normal   Rate: 77  Rhythm: normal sinus rhythm   ECG (independently read by me): NSR at 72; evolving MI changes Q 2,3, avF ; Qtc 435  Immediately post  ECG (independently read by me): AF at 117 with resolution of STE but Q waves 2,3,avF, V4-6; increased QTc 513  Pre PCI ECG (independently read by me): NSR with high risk acute inferior STEMI with 5 mm inferolateral ST elevation and precordial and high lateral ST depression  Lab Results:   Recent Labs  01/12/16 0535 01/12/16 0541 01/12/16 1842 01/13/16 0232  NA 138 141  --  138  K 3.1* 3.0*  --  3.3*  CL 103 104  --  103  CO2 20*  --   --  24  GLUCOSE 128* 135*  --  139*  BUN 15 16  --  15  CREATININE 1.26* 1.10  --  1.21  CALCIUM 9.6  --   --  8.5*  MG  --   --  1.8  --     Hepatic Function Latest Ref Rng & Units 01/13/2016 01/12/2016 04/09/2011  Total Protein 6.5 - 8.1 g/dL 5.6(L) 7.3 7.9  Albumin 3.5 - 5.0 g/dL 3.2(L) 4.3 4.2  AST 15 - 41 U/L 242(H) 37 33  ALT 17 - 63 U/L 76(H) 46 47  Alk Phosphatase  38 - 126 U/L 62 91 87  Total Bilirubin 0.3 - 1.2 mg/dL 0.9 1.2 0.8  Bilirubin, Direct 0.1 - 0.5 mg/dL 0.1 - -     Recent Labs  01/12/16 0535 01/12/16 0541 01/13/16 0232  WBC 11.7*  --  17.1*  NEUTROABS 7.2  --   --   HGB 17.6* 17.3* 14.2  HCT 48.5 51.0 40.5  MCV 84.3  --  85.4  PLT 262  --  237     Recent Labs  01/12/16 0535 01/12/16 1503  TROPONINI 0.24* >65.00*    Lab Results  Component Value Date   TSH 0.416 01/12/2016    Recent Labs  01/12/16 1503  HGBA1C 5.4     Recent Labs  01/12/16 0535 01/13/16 0232  PROT 7.3 5.6*  ALBUMIN 4.3 3.2*  AST 37 242*  ALT 46 76*  ALKPHOS 91 62  BILITOT 1.2 0.9  BILIDIR  --  0.1  IBILI  --  0.8    Recent Labs  01/12/16 0535  INR 0.90   BNP (last 3 results) No results for input(s): BNP in the last 8760 hours.  ProBNP (last 3 results) No results for input(s): PROBNP in the last 8760 hours.   Lipid Panel     Component Value Date/Time   CHOL 266 (H) 01/12/2016 0535   TRIG 183 (H) 01/12/2016 0535   HDL 47 01/12/2016 0535   CHOLHDL 5.7 01/12/2016 0535   VLDL 37 01/12/2016 0535   LDLCALC 182 (H)  01/12/2016 0535      Imaging:  No results found.  Conclusion     There is moderate left ventricular systolic dysfunction.  LV end diastolic pressure is mildly elevated.  Prox Cx to Mid Cx lesion, 75 %stenosed.  Ost 1st Mrg to 1st Mrg lesion, 70 %stenosed.  Mid LAD lesion, 80 %stenosed.  A drug eluting stent was successfully placed, and overlaps previously placed stent.  Mid RCA to Dist RCA lesion, 100 %stenosed.  Post intervention, there is a 0% residual stenosis.  A STENT RESOLUTE ONYX 3.5X15 drug eluting stent was successfully placed.  Prox RCA lesion, 95 %stenosed.  Post intervention, there is a 0% residual stenosis.   Acute inferior lateral ST segment elevation myocardial infarction secondary to total occlusion of a large dominant RCA.  Multivessel coronary obstructive disease with 80% mid LAD stenosis before the takeoff of the second diagonal vessel;  proximal left circumflex bifurcation stenosis with 75% stenosis in the proximal circumflex and 70% at the ostium of the OM1 vessel; 95% very proximal RCA stenosis with total occlusion of the mid RCA with significant thrombus and TIMI 0 flow.  A mild collaterals to the PLA branch of the dominant RCA via the LAD.  Successful percutaneous coronary intervention to the large dominant RCA with PTCA and  tandem stenting of the distal RCA with insertion of a 3.022 mm and 3.518 mm Resolute Onyx stent in the region of the acute margin extending to just proximal to the takeoff of the PDA with post stent dilatation with stent taper from 4.0 to 3.4 mm with the 100% occlusion being reduced to 0%; and PTCA/DES stenting of the proximal 95% stenosis with insertion of a 3.515 mm Resolute Onyx DES stent postdilated to 4.0 mm with the 95% stenosis being reduced to 0%.  Initially there was significant thrombus burden which resolved.  The patient developed atrial fibrillation during the procedure following reperfusion and received metoprolol  IV.  Moderate acute LV dysfunction with contractility involving the  entire inferior wall extending to and involving the apex; EF 40%.  RECOMMENDATION: The patient will continue with DAPT for a minimum of a year.  He will be changed to high potency statin with atorvastatin 80 mg, and treated with beta blocker, ACE inhibitor and nitrate therapy.  Plan for staged PCI to his LAD and circumflex, probably prior to discharge if he remains stable.         Post-Intervention Diagram     Assessment/Plan:   Active Problems:   Acute ST elevation myocardial infarction (STEMI) due to occlusion of right coronary artery (HCC)   Acute ST elevation myocardial infarction (STEMI) involving right coronary artery (HCC)   CAD (coronary artery disease), native coronary artery   Atrial fibrillation with rapid ventricular response (HCC)   Postinfarction angina (HCC)   Coronary artery disease involving native coronary artery with unstable angina pectoris ([place])  1. Day 1 s/p large Inferior STEMI;  Trop > 65;  Mild chest pain started at 11 pm;  Became severe at 3 am, and later called EMS with  EMS notification at 5 am with STEMI page resulting in some delay from chest pain onset. Now pain free with 3 stents placed in very large RCA. 2. Concomitant LAD and LCX disease; will need staged PCI, I will tentatively schedule for me to do on Tuesday, 1/9, but if patient is stable with good recovery possibly can be done on Monday 1/8 with colleagues;  3. Acute ischemic cardiomyopathy;  Acute EF at cath 40%;  Will check echo today; now on lisinopril, metoprolol 12.5 mg bid, will titrate to 25 mg bid today,  Check BNP to assess for post MI CHD; consider spironolactone if elevated. 4. AF:  Resolved with iv amiodarone; now in sinus, will dc iv and change to 400 mg bid;  5. Transaminititis; suspect secondary to MI rather than atorvastatin, but will hold atorva today and recheck LFTs in am and resume statin then if LFTs  improved 6. Hyperlipidemia; LDL 182 from yesterday; started on atorvastatin 80 mg received 1 dose so far 7. Hypokalemia; 3.1 >3.0> 3.3; received 120 meq so far; give 40 meq x3 doses today; Mg 1.8 8. Obesity. 9.  Snoring, nocturia, nonrestorative sleep and nocturnal MI will evaluate for OSA as outpatient  Time spent 40 minutes Troy Sine, MD, [facility] 01/13/2016, 8:14 AM

## 2016-01-14 ENCOUNTER — Inpatient Hospital Stay (HOSPITAL_COMMUNITY): Payer: Managed Care, Other (non HMO)

## 2016-01-14 DIAGNOSIS — I219 Acute myocardial infarction, unspecified: Secondary | ICD-10-CM

## 2016-01-14 DIAGNOSIS — E785 Hyperlipidemia, unspecified: Secondary | ICD-10-CM

## 2016-01-14 DIAGNOSIS — E669 Obesity, unspecified: Secondary | ICD-10-CM

## 2016-01-14 LAB — HEPARIN LEVEL (UNFRACTIONATED)
HEPARIN UNFRACTIONATED: 0.4 [IU]/mL (ref 0.30–0.70)
HEPARIN UNFRACTIONATED: 0.37 [IU]/mL (ref 0.30–0.70)

## 2016-01-14 LAB — ECHOCARDIOGRAM COMPLETE
AO mean calculated velocity dopler: 74.2 cm/s
AOASC: 33 cm
AOPV: 0.81 m/s
AOVTI: 22.6 cm
AV Area VTI index: 1.81 cm2/m2
AV Area mean vel: 4.23 cm2
AV Peak grad: 4 mmHg
AVAREAMEANVIN: 1.76 cm2/m2
AVAREAVTI: 4.65 cm2
AVCELMEANRAT: 0.74
AVG: 2 mmHg
AVPKVEL: 103 cm/s
CHL CUP AV PEAK INDEX: 1.94
CHL CUP AV VALUE AREA INDEX: 1.81
CHL CUP AV VEL: 4.34
CHL CUP DOP CALC LVOT VTI: 17.1 cm
CHL CUP MV DEC (S): 204
E decel time: 204 msec
E/e' ratio: 9.81
FS: 27 % — AB (ref 28–44)
HEIGHTINCHES: 74 in
IV/PV OW: 0.98
LA ID, A-P, ES: 34 mm
LA vol index: 27.7 mL/m2
LADIAMINDEX: 1.42 cm/m2
LAVOL: 66.4 mL
LAVOLA4C: 70.2 mL
LEFT ATRIUM END SYS DIAM: 34 mm
LV E/e' medial: 9.81
LV PW d: 14.5 mm — AB (ref 0.6–1.1)
LV e' LATERAL: 7.72 cm/s
LVEEAVG: 9.81
LVOT area: 5.73 cm2
LVOT peak VTI: 0.76 cm
LVOT peak vel: 83.5 cm/s
LVOTD: 27 mm
LVOTSV: 98 mL
Lateral S' vel: 8.38 cm/s
MV Peak grad: 2 mmHg
MV pk A vel: 50.4 m/s
MV pk E vel: 75.7 m/s
TAPSE: 19.1 mm
TDI e' lateral: 7.72
TDI e' medial: 7.29
Valve area: 4.34 cm2
Weight: 4035.2 oz

## 2016-01-14 LAB — COMPREHENSIVE METABOLIC PANEL
ALK PHOS: 63 U/L (ref 38–126)
ALT: 83 U/L — ABNORMAL HIGH (ref 17–63)
ANION GAP: 7 (ref 5–15)
AST: 117 U/L — ABNORMAL HIGH (ref 15–41)
Albumin: 3.1 g/dL — ABNORMAL LOW (ref 3.5–5.0)
BILIRUBIN TOTAL: 1.1 mg/dL (ref 0.3–1.2)
BUN: 18 mg/dL (ref 6–20)
CALCIUM: 8.7 mg/dL — AB (ref 8.9–10.3)
CO2: 23 mmol/L (ref 22–32)
Chloride: 107 mmol/L (ref 101–111)
Creatinine, Ser: 1.14 mg/dL (ref 0.61–1.24)
Glucose, Bld: 120 mg/dL — ABNORMAL HIGH (ref 65–99)
Potassium: 4.3 mmol/L (ref 3.5–5.1)
SODIUM: 137 mmol/L (ref 135–145)
Total Protein: 5.7 g/dL — ABNORMAL LOW (ref 6.5–8.1)

## 2016-01-14 LAB — CBC
HCT: 40.5 % (ref 39.0–52.0)
Hemoglobin: 14 g/dL (ref 13.0–17.0)
MCH: 30.2 pg (ref 26.0–34.0)
MCHC: 34.6 g/dL (ref 30.0–36.0)
MCV: 87.3 fL (ref 78.0–100.0)
Platelets: 187 10*3/uL (ref 150–400)
RBC: 4.64 MIL/uL (ref 4.22–5.81)
RDW: 13.3 % (ref 11.5–15.5)
WBC: 15.7 10*3/uL — ABNORMAL HIGH (ref 4.0–10.5)

## 2016-01-14 LAB — BRAIN NATRIURETIC PEPTIDE: B Natriuretic Peptide: 418.6 pg/mL — ABNORMAL HIGH (ref 0.0–100.0)

## 2016-01-14 MED ORDER — HEPARIN SODIUM (PORCINE) 5000 UNIT/ML IJ SOLN
5000.0000 [IU] | Freq: Three times a day (TID) | INTRAMUSCULAR | Status: DC
  Administered 2016-01-14 – 2016-01-17 (×9): 5000 [IU] via SUBCUTANEOUS
  Filled 2016-01-14 (×9): qty 1

## 2016-01-14 MED ORDER — PERFLUTREN LIPID MICROSPHERE
1.0000 mL | INTRAVENOUS | Status: AC | PRN
  Administered 2016-01-14: 2 mL via INTRAVENOUS
  Filled 2016-01-14: qty 10

## 2016-01-14 MED ORDER — PERFLUTREN LIPID MICROSPHERE
INTRAVENOUS | Status: AC
  Administered 2016-01-14: 2 mL via INTRAVENOUS
  Filled 2016-01-14: qty 10

## 2016-01-14 NOTE — Progress Notes (Signed)
ANTICOAGULATION CONSULT NOTE  Pharmacy Consult for heparin Indication: atrial fibrillation  Allergies  Allergen Reactions  . Penicillins Hives    Patient Measurements: Height: 6\' 2"  (188 cm) Weight: 252 lb 3.2 oz (114.4 kg) IBW/kg (Calculated) : 82.2 Heparin Dosing Weight: 106 kg  Vital Signs: Temp: 98.3 F (36.8 C) (01/06 0422) Temp Source: Oral (01/06 0800) BP: 112/64 (01/06 1015) Pulse Rate: 75 (01/06 1015)  Labs:  Recent Labs  01/12/16 0535 01/12/16 0541 01/12/16 1503 01/13/16 0232 01/13/16 1552 01/14/16 0316 01/14/16 0932  HGB 17.6* 17.3*  --  14.2  --  14.0  --   HCT 48.5 51.0  --  40.5  --  40.5  --   PLT 262  --   --  237  --  187  --   APTT 30  --   --   --   --   --   --   LABPROT 12.2  --   --   --   --   --   --   INR 0.90  --   --   --   --   --   --   HEPARINUNFRC  --   --   --   --  0.24* 0.40 0.37  CREATININE 1.26* 1.10  --  1.21  --  1.14  --   TROPONINI 0.24*  --  >65.00*  --   --   --   --     Estimated Creatinine Clearance: 93.8 mL/min (by C-G formula based on SCr of 1.14 mg/dL).    Assessment: 61yo M presented with STEMI, s/p PCI developed afib.  Pharmacy has been consulted to dose heparin. CBC wnl stable, no bleeding noted.  Heparin level therapeutic x 2 on heparin drip 1550 units/hr.  Goal of Therapy:  Heparin level 0.3-0.7 units/ml Monitor platelets by anticoagulation protocol: Yes   Plan:  Per MD, stop IV heparin.  No plans for oral anticoagulation unless afib recurs.  Tad Moore, BCPS  Clinical Pharmacist Pager [phone]  01/14/2016 11:21 AM

## 2016-01-14 NOTE — Progress Notes (Signed)
CARDIAC REHAB PHASE I   PRE:  Rate/Rhythm: 72  BP:  Sitting: 119/83     SaO2: 96ra  MODE:  Ambulation: 500 ft   POST:  Rate/Rhythm: 81  BP:  Sitting: 128/82     SaO2: 99ra  11:40pm-11:55am Patient ambulated independently with no complaints.  He called the walking pace "brisk". Patient sat back in chair with family at side.   Barnabas Lister Aimee Timmons, MS 01/14/2016 11:51 AM

## 2016-01-14 NOTE — Progress Notes (Signed)
ANTICOAGULATION CONSULT NOTE  Pharmacy Consult for heparin Indication: atrial fibrillation  Allergies  Allergen Reactions  . Penicillins Hives    Patient Measurements: Height: 6\' 2"  (188 cm) Weight: 252 lb 3.2 oz (114.4 kg) IBW/kg (Calculated) : 82.2 Heparin Dosing Weight: 106 kg  Vital Signs: Temp: 98.3 F (36.8 C) (01/06 0422) Temp Source: Oral (01/06 0422) BP: 111/80 (01/06 0600) Pulse Rate: 76 (01/06 0600)  Labs:  Recent Labs  01/12/16 0535 01/12/16 0541 01/12/16 1503 01/13/16 0232 01/13/16 1552 01/14/16 0316  HGB 17.6* 17.3*  --  14.2  --  14.0  HCT 48.5 51.0  --  40.5  --  40.5  PLT 262  --   --  237  --  187  APTT 30  --   --   --   --   --   LABPROT 12.2  --   --   --   --   --   INR 0.90  --   --   --   --   --   HEPARINUNFRC  --   --   --   --  0.24* 0.40  CREATININE 1.26* 1.10  --  1.21  --  1.14  TROPONINI 0.24*  --  >65.00*  --   --   --     Estimated Creatinine Clearance: 93.8 mL/min (by C-G formula based on SCr of 1.14 mg/dL).  . sodium chloride Stopped (01/12/16 2330)  . heparin 1,550 Units/hr (01/13/16 2200)    Assessment: 60yo M presented with STEMI, s/p PCI developed afib.  Pharmacy has been consulted to dose heparin. CBC wnl stable, no bleeding noted.  Heparin level therapeutic on heparin drip 1550 units/hr.  Goal of Therapy:  Heparin level 0.3-0.7 units/ml Monitor platelets by anticoagulation protocol: Yes   Plan:  Continue IV heparin at current rate. Confirm heparin level at 9 AM. Daily heparin level and CBC. F/u plans for oral anticoagulation after staged cath complete.  Tad Moore, BCPS  Clinical Pharmacist Pager [phone]  01/14/2016 7:31 AM

## 2016-01-14 NOTE — Progress Notes (Signed)
Patient Name: Shawn Elliott Date of Encounter: 01/14/2016  Primary Cardiologist: [facility] Problem List     Active Problems:   Acute ST elevation myocardial infarction (STEMI) due to occlusion of right coronary artery (HCC)   Acute ST elevation myocardial infarction (STEMI) involving right coronary artery (HCC)   CAD (coronary artery disease), native coronary artery   Atrial fibrillation with rapid ventricular response (HCC)   Postinfarction angina (HCC)   Coronary artery disease involving native coronary artery with unstable angina pectoris (HCC)   Obesity (BMI 30-39.9)   Hyperlipidemia LDL goal <70   Elevated liver function tests     Subjective   Feels well. No angina or dyspnea.  Inpatient Medications    Scheduled Meds: . amiodarone  400 mg Oral BID  . aspirin  81 mg Oral Daily  . atorvastatin  80 mg Oral q1800  . lisinopril  2.5 mg Oral Daily  . metoprolol tartrate  25 mg Oral BID  . pantoprazole  40 mg Oral Daily  . sodium chloride flush  3 mL Intravenous Q12H  . ticagrelor  90 mg Oral BID   Continuous Infusions: . sodium chloride Stopped (01/12/16 2330)  . heparin 1,550 Units/hr (01/13/16 2200)   PRN Meds: sodium chloride, acetaminophen, alum & mag hydroxide-simeth, morphine injection, nitroGLYCERIN, ondansetron (ZOFRAN) IV, perflutren lipid microspheres (DEFINITY) IV suspension, sodium chloride flush   Vital Signs    Vitals:   01/14/16 0400 01/14/16 0422 01/14/16 0500 01/14/16 0600  BP: 107/77 107/77 117/75 111/80  Pulse: 72 70 78 76  Resp: (!) 22 (!) 24 19 (!) 21  Temp:  98.3 F (36.8 C)    TempSrc:  Oral    SpO2: 97% 94% 98% 98%  Weight:      Height:        Intake/Output Summary (Last 24 hours) at 01/14/16 0948 Last data filed at 01/14/16 0600  Gross per 24 hour  Intake           324.96 ml  Output              250 ml  Net            74.96 ml   Filed Weights   01/12/16 0804  Weight: 252 lb 3.2 oz (114.4 kg)    Physical Exam   Looks comfortable GEN: Well nourished, well developed, in no acute distress.  HEENT: Grossly normal.  Neck: Supple, no JVD, carotid bruits, or masses. Cardiac: RRR, no murmurs, rubs, or gallops. No clubbing, cyanosis, edema.  Radials/DP/PT 2+ and equal bilaterally.  Respiratory:  Respirations regular and unlabored, clear to auscultation bilaterally. GI: Soft, nontender, nondistended, BS + x 4. MS: no deformity or atrophy. Skin: warm and dry, no rash. Tiny ecchymosis at cath site Neuro:  Strength and sensation are intact. Psych: AAOx3.  Normal affect.  Labs    CBC  Recent Labs  01/12/16 0535  01/13/16 0232 01/14/16 0316  WBC 11.7*  --  17.1* 15.7*  NEUTROABS 7.2  --   --   --   HGB 17.6*  < > 14.2 14.0  HCT 48.5  < > 40.5 40.5  MCV 84.3  --  85.4 87.3  PLT 262  --  237 187  < > = values in this interval not displayed. Basic Metabolic Panel  Recent Labs  01/12/16 1842 01/13/16 0232 01/14/16 0316  NA  --  138 137  K  --  3.3* 4.3  CL  --  103  107  CO2  --  24 23  GLUCOSE  --  139* 120*  BUN  --  15 18  CREATININE  --  1.21 1.14  CALCIUM  --  8.5* 8.7*  MG 1.8  --   --    Liver Function Tests  Recent Labs  01/13/16 0232 01/14/16 0316  AST 242* 117*  ALT 76* 83*  ALKPHOS 62 63  BILITOT 0.9 1.1  PROT 5.6* 5.7*  ALBUMIN 3.2* 3.1*   No results for input(s): LIPASE, AMYLASE in the last 72 hours. Cardiac Enzymes  Recent Labs  01/12/16 0535 01/12/16 1503  TROPONINI 0.24* >65.00*   BNP Invalid input(s): POCBNP D-Dimer No results for input(s): DDIMER in the last 72 hours. Hemoglobin A1C  Recent Labs  01/12/16 1503  HGBA1C 5.4   Fasting Lipid Panel  Recent Labs  01/12/16 0535  CHOL 266*  HDL 47  LDLCALC 182*  TRIG 183*  CHOLHDL 5.7   Thyroid Function Tests  Recent Labs  01/12/16 1842  TSH 0.416    Telemetry    NSR - Personally Reviewed  ECG    NSR, inc RBBB, mild residual inferior ST elevation, QTc 505 ms - Personally  Reviewed  Radiology    No results found.  Cardiac Studies   ECHO - Personally Reviewed - Left ventricle: The cavity size was mildly dilated. There was   moderate concentric hypertrophy. Systolic function was mildly to   moderately reduced. The estimated ejection fraction was in the   range of 40% to 45%. Severe hypokinesis of the inferolateral and   inferior myocardium. Features are consistent with a pseudonormal   left ventricular filling pattern, with concomitant abnormal   relaxation and increased filling pressure (grade 2 diastolic   dysfunction). No evidence of thrombus. - Left atrium: The atrium was mildly dilated. - Right atrium: The atrium was mildly dilated. - Atrial septum: No defect or patent foramen ovale was identified. - Pericardium, extracardiac: A trivial pericardial effusion was   identified.  CATH:    Acute inferior lateral ST segment elevation myocardial infarction secondary to total occlusion of a large dominant RCA.  Multivessel coronary obstructive disease with 80% mid LAD stenosis before the takeoff of the second diagonal vessel;  proximal left circumflex bifurcation stenosis with 75% stenosis in the proximal circumflex and 70% at the ostium of the OM1 vessel; 95% very proximal RCA stenosis with total occlusion of the mid RCA with significant thrombus and TIMI 0 flow.  A mild collaterals to the PLA branch of the dominant RCA via the LAD.  Successful percutaneous coronary intervention to the large dominant RCA with PTCA and  tandem stenting of the distal RCA with insertion of a 3.022 mm and 3.518 mm Resolute Onyx stent in the region of the acute margin extending to just proximal to the takeoff of the PDA with post stent dilatation with stent taper from 4.0 to 3.4 mm with the 100% occlusion being reduced to 0%; and PTCA/DES stenting of the proximal 95% stenosis with insertion of a 3.515 mm Resolute Onyx DES stent postdilated to 4.0 mm with the 95% stenosis  being reduced to 0%.  Initially there was significant thrombus burden which resolved.  The patient developed atrial fibrillation during the procedure following reperfusion and received metoprolol IV.  Moderate acute LV dysfunction with contractility involving the entire inferior wall extending to and involving the apex; EF 40%.  Diagnostic Diagram     Post-Intervention Diagram       Patient  Profile     61 yo with newly diagnosed multivessel CAD presenting with acute inferior STEMI and thrombotic occlusion of RCA requiring multiple drug-eluting stents. Had transient PAFib.  Assessment & Plan    1. S/P STEMI: asymptomatic. Transfer telemetry. Stop IV heparin Discussed mandatory uninterrupted DAPT for at least 12 months.  2. Multivessel CAD: plan for PCI LCX and LAD early next week.  3. Mild ischemic CMP: so far without symptoms or physical findings of CHF. Echo suggests mild fluid overload. LVEDP was high at cath. BP a little soft. Will hold off from diuretics or increasing ACEi dose.  4. HLP: on high dose statin  5. AFib: brief, self limited in setting of acute MI. Not sure that he should be on long term anticoagulation, especially since he will need DAPT. IF AF occurs again during this hospital stay, will plan oral anticoagulant + Brilinta and stop ASA after 30 days.  Signed, Thurmon Fair, MD  01/14/2016, 9:48 AM

## 2016-01-14 NOTE — Progress Notes (Signed)
EKG CRITICAL VALUE     12 lead EKG performed.  Critical value noted.  Lambert Mody, RN notified.   Oda Cogan, CCT 01/14/2016 9:58 AM

## 2016-01-15 LAB — CBC
HEMATOCRIT: 41.5 % (ref 39.0–52.0)
Hemoglobin: 14.1 g/dL (ref 13.0–17.0)
MCH: 30.2 pg (ref 26.0–34.0)
MCHC: 34 g/dL (ref 30.0–36.0)
MCV: 88.9 fL (ref 78.0–100.0)
Platelets: 178 10*3/uL (ref 150–400)
RBC: 4.67 MIL/uL (ref 4.22–5.81)
RDW: 13.6 % (ref 11.5–15.5)
WBC: 17.5 10*3/uL — AB (ref 4.0–10.5)

## 2016-01-15 MED ORDER — METOPROLOL TARTRATE 12.5 MG HALF TABLET
12.5000 mg | ORAL_TABLET | Freq: Two times a day (BID) | ORAL | Status: DC
  Administered 2016-01-15 – 2016-01-18 (×6): 12.5 mg via ORAL
  Filled 2016-01-15 (×6): qty 1

## 2016-01-15 MED ORDER — FUROSEMIDE 20 MG PO TABS
20.0000 mg | ORAL_TABLET | Freq: Every day | ORAL | Status: DC
  Administered 2016-01-15 – 2016-01-16 (×2): 20 mg via ORAL
  Filled 2016-01-15 (×2): qty 1

## 2016-01-15 NOTE — Progress Notes (Signed)
Patient Name: Shawn Elliott Date of Encounter: 01/15/2016  Primary Cardiologist: [facility] Problem List     Active Problems:   Acute ST elevation myocardial infarction (STEMI) due to occlusion of right coronary artery (HCC)   Acute ST elevation myocardial infarction (STEMI) involving right coronary artery (HCC)   CAD (coronary artery disease), native coronary artery   Atrial fibrillation with rapid ventricular response (HCC)   Postinfarction angina (HCC)   Coronary artery disease involving native coronary artery with unstable angina pectoris (HCC)   Obesity (BMI 30-39.9)   Hyperlipidemia LDL goal <70   Elevated liver function tests     Subjective   Complains of DOE; CP when lying on left side not like MI pain.  Inpatient Medications    Scheduled Meds: . amiodarone  400 mg Oral BID  . aspirin  81 mg Oral Daily  . atorvastatin  80 mg Oral q1800  . heparin subcutaneous  5,000 Units Subcutaneous Q8H  . lisinopril  2.5 mg Oral Daily  . metoprolol tartrate  25 mg Oral BID  . pantoprazole  40 mg Oral Daily  . sodium chloride flush  3 mL Intravenous Q12H  . ticagrelor  90 mg Oral BID   Continuous Infusions:  PRN Meds: sodium chloride, acetaminophen, alum & mag hydroxide-simeth, morphine injection, nitroGLYCERIN, ondansetron (ZOFRAN) IV, sodium chloride flush   Vital Signs    Vitals:   01/14/16 2242 01/15/16 0000 01/15/16 0658 01/15/16 1004  BP: 121/72 120/74 113/67 (!) 99/56  Pulse: 86 85 84   Resp: 18 18 18 18   Temp: 99.9 F (37.7 C) 98.2 F (36.8 C) 99.3 F (37.4 C)   TempSrc: Oral Oral Oral   SpO2: 97% 98% 95% 99%  Weight:   252 lb 6.4 oz (114.5 kg)   Height:        Intake/Output Summary (Last 24 hours) at 01/15/16 1049 Last data filed at 01/15/16 0938  Gross per 24 hour  Intake           743.77 ml  Output              300 ml  Net           443.77 ml   Filed Weights   01/12/16 0804 01/14/16 1626 01/15/16 0658  Weight: 252 lb 3.2 oz (114.4 kg)  252 lb 3.3 oz (114.4 kg) 252 lb 6.4 oz (114.5 kg)    Physical Exam   Looks comfortable GEN: Well nourished, well developed, in no acute distress.  HEENT: Grossly normal.  Neck: Supple Cardiac: RRR Respiratory:  CTA; tender over left chest  GI: Soft, nontender, nondistended. MS: no deformity or atrophy. Skin: warm and dry, no rash. Tiny ecchymosis at cath site Neuro:  Strength and sensation are intact. Psych: AAOx3.  Normal affect.  Labs    CBC  Recent Labs  01/14/16 0316 01/15/16 0306  WBC 15.7* 17.5*  HGB 14.0 14.1  HCT 40.5 41.5  MCV 87.3 88.9  PLT 187 178   Basic Metabolic Panel  Recent Labs  01/12/16 1842 01/13/16 0232 01/14/16 0316  NA  --  138 137  K  --  3.3* 4.3  CL  --  103 107  CO2  --  24 23  GLUCOSE  --  139* 120*  BUN  --  15 18  CREATININE  --  1.21 1.14  CALCIUM  --  8.5* 8.7*  MG 1.8  --   --    Liver Function Tests  Recent Labs  01/13/16 0232 01/14/16 0316  AST 242* 117*  ALT 76* 83*  ALKPHOS 62 63  BILITOT 0.9 1.1  PROT 5.6* 5.7*  ALBUMIN 3.2* 3.1*   Cardiac Enzymes  Recent Labs  01/12/16 1503  TROPONINI >65.00*   Hemoglobin A1C  Recent Labs  01/12/16 1503  HGBA1C 5.4   Thyroid Function Tests  Recent Labs  01/12/16 1842  TSH 0.416    Telemetry    NSR - Personally Reviewed  ECG    NSR, inc RBBB, mild residual inferior ST elevation, QTc 505 ms - Personally Reviewed    Cardiac Studies   ECHO - Personally Reviewed - Left ventricle: The cavity size was mildly dilated. There was   moderate concentric hypertrophy. Systolic function was mildly to   moderately reduced. The estimated ejection fraction was in the   range of 40% to 45%. Severe hypokinesis of the inferolateral and   inferior myocardium. Features are consistent with a pseudonormal   left ventricular filling pattern, with concomitant abnormal   relaxation and increased filling pressure (grade 2 diastolic   dysfunction). No evidence of  thrombus. - Left atrium: The atrium was mildly dilated. - Right atrium: The atrium was mildly dilated. - Atrial septum: No defect or patent foramen ovale was identified. - Pericardium, extracardiac: A trivial pericardial effusion was   identified.  CATH:    Acute inferior lateral ST segment elevation myocardial infarction secondary to total occlusion of a large dominant RCA.  Multivessel coronary obstructive disease with 80% mid LAD stenosis before the takeoff of the second diagonal vessel;  proximal left circumflex bifurcation stenosis with 75% stenosis in the proximal circumflex and 70% at the ostium of the OM1 vessel; 95% very proximal RCA stenosis with total occlusion of the mid RCA with significant thrombus and TIMI 0 flow.  A mild collaterals to the PLA branch of the dominant RCA via the LAD.  Successful percutaneous coronary intervention to the large dominant RCA with PTCA and  tandem stenting of the distal RCA with insertion of a 3.022 mm and 3.518 mm Resolute Onyx stent in the region of the acute margin extending to just proximal to the takeoff of the PDA with post stent dilatation with stent taper from 4.0 to 3.4 mm with the 100% occlusion being reduced to 0%; and PTCA/DES stenting of the proximal 95% stenosis with insertion of a 3.515 mm Resolute Onyx DES stent postdilated to 4.0 mm with the 95% stenosis being reduced to 0%.  Initially there was significant thrombus burden which resolved.  The patient developed atrial fibrillation during the procedure following reperfusion and received metoprolol IV.  Moderate acute LV dysfunction with contractility involving the entire inferior wall extending to and involving the apex; EF 40%.  Diagnostic Diagram     Post-Intervention Diagram       Patient Profile     61 yo with newly diagnosed multivessel CAD presenting with acute inferior STEMI and thrombotic occlusion of RCA requiring multiple drug-eluting stents. Had transient  PAFib.  Assessment & Plan    1. S/P STEMI: continue asa, brilinta and statin. Continue metoprolol; BP borderline; hold ACEI.   2. Multivessel CAD: plan for PCI LCX and LAD Tuesday per Dr Tresa Endo.  3. Mild ischemic CMP/acute combined systolic/diastolic CHF: complains of DOE; add lasix 20 mg daily and follow renal function.   4. HLP: on high dose statin  5. AFib: CHADsvasc 3; brief, self limited in setting of acute MI. DC amiodarone. No anticoagulation long term  unless recurs.  Signed, Olga Millers, MD  01/15/2016, 10:49 AM

## 2016-01-16 LAB — BASIC METABOLIC PANEL
Anion gap: 8 (ref 5–15)
BUN: 22 mg/dL — AB (ref 6–20)
CALCIUM: 8.3 mg/dL — AB (ref 8.9–10.3)
CO2: 23 mmol/L (ref 22–32)
Chloride: 106 mmol/L (ref 101–111)
Creatinine, Ser: 1.34 mg/dL — ABNORMAL HIGH (ref 0.61–1.24)
GFR calc Af Amer: >60 mL/min (ref >60)
GFR, EST NON AFRICAN AMERICAN: 56 mL/min — AB (ref >60)
GLUCOSE: 120 mg/dL — AB (ref 65–99)
Potassium: 3.5 mmol/L (ref 3.5–5.1)
SODIUM: 137 mmol/L (ref 135–145)

## 2016-01-16 LAB — CBC
HCT: 40.3 % (ref 39.0–52.0)
Hemoglobin: 13.7 g/dL (ref 13.0–17.0)
MCH: 30.2 pg (ref 26.0–34.0)
MCHC: 34 g/dL (ref 30.0–36.0)
MCV: 88.8 fL (ref 78.0–100.0)
PLATELETS: 168 10*3/uL (ref 150–400)
RBC: 4.54 MIL/uL (ref 4.22–5.81)
RDW: 13.6 % (ref 11.5–15.5)
WBC: 12.1 10*3/uL — ABNORMAL HIGH (ref 4.0–10.5)

## 2016-01-16 MED ORDER — SODIUM CHLORIDE 0.9 % IV SOLN
INTRAVENOUS | Status: DC
  Administered 2016-01-17: 05:00:00 via INTRAVENOUS

## 2016-01-16 MED ORDER — ASPIRIN 81 MG PO CHEW
81.0000 mg | CHEWABLE_TABLET | ORAL | Status: AC
  Administered 2016-01-17: 81 mg via ORAL
  Filled 2016-01-16: qty 1

## 2016-01-16 MED ORDER — GUAIFENESIN-DM 100-10 MG/5ML PO SYRP
5.0000 mL | ORAL_SOLUTION | ORAL | Status: DC | PRN
  Administered 2016-01-16 (×2): 5 mL via ORAL
  Filled 2016-01-16 (×2): qty 5

## 2016-01-16 NOTE — Plan of Care (Signed)
Problem: Tissue Perfusion: Goal: Risk factors for ineffective tissue perfusion will decrease Outcome: Completed/Met Date Met: 01/16/16 Stents placed and for more stent placements x 2 tomorrow.

## 2016-01-16 NOTE — Progress Notes (Signed)
RE: Benefit check  Received: Today   Mardene Sayer CMA        S/W LESLIE P. @ [employer] [facility] # [phone]   BRILINTA 90 MG BID 30 / 60 TAB   COVER- YES  CO-PAY- $ 30.00  TIER- 3 DRUG  PRIOR APPROVAL - NO   PH[facility]Y : [facility]

## 2016-01-16 NOTE — Progress Notes (Signed)
Patient signed consent for Left Heart Cath with Possible Percutaneous Coronary Intervention tomorrow.  Instructed him he will be NPO after midnight and he stated understanding.  NS to start at 125cc/hr at 0400, 01/17/2016.

## 2016-01-16 NOTE — Progress Notes (Signed)
Patient Name: Shawn Elliott Date of Encounter: 01/16/2016  Primary Cardiologist: Dr. Rachelle Hora Problem List     Active Problems:   Acute ST elevation myocardial infarction (STEMI) due to occlusion of right coronary artery (HCC)   Acute ST elevation myocardial infarction (STEMI) involving right coronary artery (HCC)   CAD (coronary artery disease), native coronary artery   Atrial fibrillation with rapid ventricular response (HCC)   Postinfarction angina (HCC)   Coronary artery disease involving native coronary artery with unstable angina pectoris (HCC)   Obesity (BMI 30-39.9)   Hyperlipidemia LDL goal <70   Elevated liver function tests     Subjective   Feels well, denies chest pain and SOB.   Inpatient Medications    Scheduled Meds: . aspirin  81 mg Oral Daily  . atorvastatin  80 mg Oral q1800  . furosemide  20 mg Oral Daily  . heparin subcutaneous  5,000 Units Subcutaneous Q8H  . metoprolol tartrate  12.5 mg Oral BID  . pantoprazole  40 mg Oral Daily  . sodium chloride flush  3 mL Intravenous Q12H  . ticagrelor  90 mg Oral BID   Continuous Infusions:  PRN Meds: sodium chloride, acetaminophen, alum & mag hydroxide-simeth, guaiFENesin-dextromethorphan, morphine injection, nitroGLYCERIN, ondansetron (ZOFRAN) IV, sodium chloride flush   Vital Signs    Vitals:   01/15/16 1004 01/15/16 1207 01/15/16 2014 01/16/16 0502  BP: (!) 99/56 116/71 119/65 118/68  Pulse:  78 79 81  Resp: 18 20 20 20   Temp:  98.4 F (36.9 C) 98.8 F (37.1 C) 99.3 F (37.4 C)  TempSrc:  Oral Oral Oral  SpO2: 99% 97% 98% 94%  Weight:    252 lb 14.4 oz (114.7 kg)  Height:        Intake/Output Summary (Last 24 hours) at 01/16/16 1011 Last data filed at 01/16/16 0907  Gross per 24 hour  Intake             1040 ml  Output              800 ml  Net              240 ml   Filed Weights   01/14/16 1626 01/15/16 0658 01/16/16 0502  Weight: 252 lb 3.3 oz (114.4 kg) 252 lb 6.4 oz  (114.5 kg) 252 lb 14.4 oz (114.7 kg)    Physical Exam    GEN: Well nourished, well developed, in no acute distress.  HEENT: Grossly normal.  Neck: Supple, no JVD, carotid bruits, or masses. Cardiac: RRR, no murmurs, rubs, or gallops. No clubbing, cyanosis, edema.  Radials/DP/PT 2+ and equal bilaterally.  Respiratory:  Respirations regular and unlabored, clear to auscultation bilaterally. GI: Soft, nontender, nondistended, BS + x 4. MS: no deformity or atrophy. Skin: warm and dry, no rash. Neuro:  Strength and sensation are intact. Psych: AAOx3.  Normal affect.  Labs    CBC  Recent Labs  01/15/16 0306 01/16/16 0302  WBC 17.5* 12.1*  HGB 14.1 13.7  HCT 41.5 40.3  MCV 88.9 88.8  PLT 178 168   Basic Metabolic Panel  Recent Labs  01/14/16 0316 01/16/16 0302  NA 137 137  K 4.3 3.5  CL 107 106  CO2 23 23  GLUCOSE 120* 120*  BUN 18 22*  CREATININE 1.14 1.34*  CALCIUM 8.7* 8.3*   Liver Function Tests  Recent Labs  01/14/16 0316  AST 117*  ALT 83*  ALKPHOS 63  BILITOT 1.1  PROT 5.7*  ALBUMIN 3.1*     Telemetry    NSR - Personally Reviewed  ECG    NSR, inc RBBB, mild residual inferior ST elevation, QTc 505 ms - Personally Reviewed  Radiology    No results found.  Cardiac Studies   Echo 01/14/16 - Left ventricle: The cavity size was mildly dilated. There was moderate concentric hypertrophy. Systolic function was mildly to moderately reduced. The estimated ejection fraction was in the range of 40% to 45%. Severe hypokinesis of the inferolateral and inferior myocardium. Features are consistent with a pseudonormal left ventricular filling pattern, with concomitant abnormal relaxation and increased filling pressure (grade 2 diastolic dysfunction). No evidence of thrombus. - Left atrium: The atrium was mildly dilated. - Right atrium: The atrium was mildly dilated. - Atrial septum: No defect or patent foramen ovale was identified. -  Pericardium, extracardiac: A trivial pericardial effusion was identified.  CATH:   Acute inferior lateral ST segment elevation myocardial infarction secondary to total occlusion of a large dominant RCA.  Multivessel coronary obstructive disease with 80% mid LAD stenosis before the takeoff of the second diagonal vessel; proximal left circumflex bifurcation stenosis with 75% stenosis in the proximal circumflex and 70% at the ostium of the OM1 vessel; 95% very proximal RCA stenosis with total occlusion of the mid RCA with significant thrombus and TIMI 0 flow. A mild collaterals to the PLA branch of the dominant RCA via the LAD.  Successful percutaneous coronary intervention to the large dominant RCA with PTCA and tandem stenting of the distal RCA with insertion of a 3.022 mm and 3.518 mm Resolute Onyx stent in the region of the acute margin extending to just proximal to the takeoff of the PDA with post stent dilatation with stent taper from 4.0 to 3.4 mm with the 100% occlusion being reduced to 0%; and PTCA/DES stenting of the proximal 95% stenosis with insertion of a 3.515 mm Resolute Onyx DES stent postdilated to 4.0 mm with the 95% stenosis being reduced to 0%. Initially there was significant thrombus burden which resolved. The patient developed atrial fibrillation during the procedure following reperfusion and received metoprolol IV.  Moderate acute LV dysfunction with contractility involving the entire inferior wall extending to and involving the apex; EF 40%.  Diagnostic Diagram     Post-Intervention Diagram         Patient Profile     61 year old male with a past medical history of HLD. Presented as an inferior STEMI and thrombotic occlusion of RCA requiring 3 DES. Plan for staged PCI to LAD and LCx on Tuesday 01/17/16.   Assessment & Plan    1. Acute inferior STEMI: Continue ASA, Brilinta, metoprolol. Plan for staged PCI to LCx and LAD tomorrow with Dr. Tresa Endo.  Patient is aware and agreeable.   2. Ischemic cardiomyopathy: LVEF 40-45%. Lasix started yesterday, 20mg  po. Needs ACE-I, but creatinine is 1.34 and needs PCI tomorrow. Will start ACE-I after cath tomorrow pending normalization of renal function.   3. Atrial fibrillation: In the setting of acute MI, resolved now. Was on Amio for a brief time. This has been discontinued. No anticoagulation unless reoccurence.   Signed, Little Ishikawa, NP  01/16/2016, 10:11 AM   I have personally seen and examined this patient with Suzzette Righter, NP I agree with the assessment and plan as outlined above. He was admitted with an inferior STEMI. He is doing well. Plans for staged PCI/stenting of the LAD and Circumflex tomorrow. Continue current meds.  Verne Carrow 01/16/2016 12:17 PM

## 2016-01-16 NOTE — Plan of Care (Signed)
Problem: Cardiovascular: Goal: Vascular access site(s) Level 0-1 will be maintained Outcome: Completed/Met Date Met: 01/16/16 Right femoral = Level 1 (ecchymosis)

## 2016-01-16 NOTE — ED Provider Notes (Signed)
[facility]-EMERGENCY DEPT Provider Note   CSN: [id number] Arrival date & time: 01/12/16  0557     History   Chief Complaint Chief Complaint  Patient presents with  . Code STEMI    HPI Shawn Elliott is a 61 y.o. male.  HPI  61 yo with history of HTN and hyperlipidemia presents with cc of chest pain Pt reports that he started having chest pain earlier today, went to sleep, woke up and the pain was persistent, and then got severe. Patient had stress test that was negative per his report 3-4 years ago but no other prior cardiac evaluation. STEMI activation done on the field.   Past Medical History:  Diagnosis Date  . GERD (gastroesophageal reflux disease)   . Hypertension     Patient Active Problem List   Diagnosis Date Noted  . Obesity (BMI 30-39.9)   . Hyperlipidemia LDL goal <70   . Elevated liver function tests   . Acute ST elevation myocardial infarction (STEMI) involving right coronary artery (HCC) 01/12/2016  . CAD (coronary artery disease), native coronary artery 01/12/2016  . Atrial fibrillation with rapid ventricular response (HCC) 01/12/2016  . 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 Acute ST elevation myocardial infarction (STEMI) due to occlusion of right coronary artery (HCC)   . Coronary artery disease involving native coronary artery with unstable angina pectoris [facility])     Past Surgical History:  Procedure Laterality Date  . CARDIAC CATHETERIZATION N/A 01/12/2016   Procedure: Left Heart Cath and Coronary Angiography;  Surgeon: Lennette Bihari, MD;  Location: [facility] INVASIVE CV LAB;  Service: Cardiovascular;  Laterality: N/A;  . CARDIAC CATHETERIZATION N/A 01/12/2016   Procedure: Coronary Stent Intervention;  Surgeon: Lennette Bihari, MD;  Location: [facility] INVASIVE CV LAB;  Service: Cardiovascular;  Laterality: N/A;       Home Medications    Prior to Admission medications   Medication Sig Start Date End Date Taking? Authorizing Provider  amLODipine (NORVASC) 5 MG  tablet Take 5 mg by mouth daily. 04/12/15 04/11/16 Yes Historical Provider, MD  aspirin 81 MG chewable tablet Chew 81 mg by mouth daily.   Yes Historical Provider, MD  lovastatin (MEVACOR) 40 MG tablet Take 40 mg by mouth daily. 12/04/15  Yes Historical Provider, MD  olmesartan-hydrochlorothiazide (BENICAR HCT) 40-12.5 MG tablet Take 1 tablet by mouth daily. 12/04/15  Yes Historical Provider, MD  omeprazole (PRILOSEC) 20 MG capsule Take 20 mg by mouth daily. 12/04/15  Yes Historical Provider, MD  tizanidine (ZANAFLEX) 2 MG capsule Take 2 mg by mouth 3 (three) times daily as needed for muscle spasms. 03/22/15 03/21/16 Yes Historical Provider, MD    Family History No family history on file.  Social History Social History  Substance Use Topics  . Smoking status: Never Smoker  . Smokeless tobacco: Never Used  . Alcohol use Not on file     Allergies   Penicillins   Review of Systems Review of Systems  Unable to perform ROS: Acuity of condition     Physical Exam Updated Vital Signs BP 105/69 (BP Location: Right Arm)   Pulse 68   Temp 97.5 F (36.4 C) (Oral)   Resp 18   Ht 6\' 2"  (1.88 m)   Wt 252 lb 14.4 oz (114.7 kg) Comment: Scale B  SpO2 97%   BMI 32.47 kg/m   Physical Exam  Constitutional: He is oriented to person, place, and time. He appears well-developed.  HENT:  Head: Atraumatic.  Neck: Neck supple.  Cardiovascular:  Normal rate and intact distal pulses.   Pulmonary/Chest: Effort normal.  Abdominal: He exhibits no distension. There is no tenderness.  Neurological: He is alert and oriented to person, place, and time.  Skin: Skin is warm.  Nursing note and vitals reviewed.    ED Treatments / Results  Labs (all labs ordered are listed, but only abnormal results are displayed) Labs Reviewed  CBC - Abnormal; Notable for the following:       Result Value   WBC 11.7 (*)    Hemoglobin 17.6 (*)    MCHC 36.3 (*)    All other components within normal limits    COMPREHENSIVE METABOLIC PANEL - Abnormal; Notable for the following:    Potassium 3.1 (*)    CO2 20 (*)    Glucose, Bld 128 (*)    Creatinine, Ser 1.26 (*)    All other components within normal limits  TROPONIN I - Abnormal; Notable for the following:    Troponin I 0.24 (*)    All other components within normal limits  LIPID PANEL - Abnormal; Notable for the following:    Cholesterol 266 (*)    Triglycerides 183 (*)    LDL Cholesterol 182 (*)    All other components within normal limits  TROPONIN I - Abnormal; Notable for the following:    Troponin I >65.00 (*)    All other components within normal limits  BASIC METABOLIC PANEL - Abnormal; Notable for the following:    Potassium 3.3 (*)    Glucose, Bld 139 (*)    Calcium 8.5 (*)    All other components within normal limits  CBC - Abnormal; Notable for the following:    WBC 17.1 (*)    All other components within normal limits  HEPATIC FUNCTION PANEL - Abnormal; Notable for the following:    Total Protein 5.6 (*)    Albumin 3.2 (*)    AST 242 (*)    ALT 76 (*)    All other components within normal limits  HEPARIN LEVEL (UNFRACTIONATED) - Abnormal; Notable for the following:    Heparin Unfractionated 0.24 (*)    All other components within normal limits  CBC - Abnormal; Notable for the following:    WBC 15.7 (*)    All other components within normal limits  BRAIN NATRIURETIC PEPTIDE - Abnormal; Notable for the following:    B Natriuretic Peptide 418.6 (*)    All other components within normal limits  COMPREHENSIVE METABOLIC PANEL - Abnormal; Notable for the following:    Glucose, Bld 120 (*)    Calcium 8.7 (*)    Total Protein 5.7 (*)    Albumin 3.1 (*)    AST 117 (*)    ALT 83 (*)    All other components within normal limits  CBC - Abnormal; Notable for the following:    WBC 17.5 (*)    All other components within normal limits  CBC - Abnormal; Notable for the following:    WBC 12.1 (*)    All other components  within normal limits  BASIC METABOLIC PANEL - Abnormal; Notable for the following:    Glucose, Bld 120 (*)    BUN 22 (*)    Creatinine, Ser 1.34 (*)    Calcium 8.3 (*)    GFR calc non Af Amer 56 (*)    All other components within normal limits  POCT I-STAT, CHEM 8 - Abnormal; Notable for the following:    Potassium 3.0 (*)  Glucose, Bld 135 (*)    Calcium, Ion 1.03 (*)    Hemoglobin 17.3 (*)    All other components within normal limits  CG4 I-STAT (LACTIC ACID) - Abnormal; Notable for the following:    Lactic Acid, Venous 4.26 (*)    All other components within normal limits  POCT I-STAT TROPONIN I - Abnormal; Notable for the following:    Troponin i, poc 0.26 (*)    All other components within normal limits  MRSA PCR SCREENING  DIFFERENTIAL  PROTIME-INR  APTT  HEMOGLOBIN A1C  MAGNESIUM  TSH  HEPARIN LEVEL (UNFRACTIONATED)  HEPARIN LEVEL (UNFRACTIONATED)  CBC  BASIC METABOLIC PANEL  POCT ACTIVATED CLOTTING TIME  POCT ACTIVATED CLOTTING TIME    EKG  EKG Interpretation  Date/Time:  Thursday January 12 2016 05:30:26 EST Ventricular Rate:  93 PR Interval:    QRS Duration: 126 QT Interval:  365 QTC Calculation: 454 R Axis:   93 Text Interpretation:  Sinus rhythm Probable left atrial enlargement Nonspecific intraventricular conduction delay Inferoposterior infarct, acute (RCA) Anterolateral infarct, acute Probable RV involvement, suggest recording right precordial leads ** ** ACUTE MI / STEMI ** ** Confirmed by [facility] MD, PEDRO ([id number]) on 01/14/2016 8:37:53 PM       Radiology No results found.  Procedures Procedures (including critical care time)  CRITICAL CARE Performed by: Derwood Kaplan   Total critical care time: 20 minutes  Critical care time was exclusive of separately billable procedures and treating other patients.  Critical care was necessary to treat or prevent imminent or life-threatening deterioration.  Critical care was time spent personally  by me on the following activities: development of treatment plan with patient and/or surrogate as well as nursing, discussions with consultants, evaluation of patient's response to treatment, examination of patient, obtaining history from patient or surrogate, ordering and performing treatments and interventions, ordering and review of laboratory studies, ordering and review of radiographic studies, pulse oximetry and re-evaluation of patient's condition.   Medications Ordered in ED Medications  nitroGLYCERIN (NITROSTAT) SL tablet 0.4 mg ( Sublingual MAR Unhold 01/12/16 0749)  sodium chloride flush (NS) 0.9 % injection 3 mL (3 mLs Intravenous Given 01/16/16 2208)  sodium chloride flush (NS) 0.9 % injection 3 mL (not administered)  0.9 %  sodium chloride infusion (not administered)  acetaminophen (TYLENOL) tablet 650 mg (650 mg Oral Given 01/13/16 0329)  ondansetron (ZOFRAN) injection 4 mg (4 mg Intravenous Given 01/12/16 0933)  morphine 2 MG/ML injection 2 mg (2 mg Intravenous Given 01/12/16 0929)  aspirin chewable tablet 81 mg (81 mg Oral Given 01/16/16 1038)  ticagrelor (BRILINTA) tablet 90 mg (90 mg Oral Given 01/16/16 1851)  bivalirudin (ANGIOMAX) 250 mg in sodium chloride 0.9 % 50 mL (5 mg/mL) infusion (0 mg/kg/hr  114.4 kg Intravenous Stopped 01/12/16 1322)  alum & mag hydroxide-simeth (MAALOX/MYLANTA) 200-200-20 MG/5ML suspension 30 mL (30 mLs Oral Given 01/12/16 1713)  pantoprazole (PROTONIX) EC tablet 40 mg (40 mg Oral Given 01/16/16 1038)  atorvastatin (LIPITOR) tablet 80 mg (80 mg Oral Given 01/16/16 1753)  perflutren lipid microspheres (DEFINITY) IV suspension (2 mLs Intravenous Given 01/14/16 0903)  heparin injection 5,000 Units (5,000 Units Subcutaneous Given 01/16/16 2207)  metoprolol tartrate (LOPRESSOR) tablet 12.5 mg (12.5 mg Oral Given 01/16/16 2208)  furosemide (LASIX) tablet 20 mg (20 mg Oral Given 01/16/16 1038)  guaiFENesin-dextromethorphan (ROBITUSSIN DM) 100-10 MG/5ML syrup 5 mL (5 mLs Oral Given  01/16/16 2208)  aspirin chewable tablet 81 mg (not administered)  0.9 %  sodium chloride  infusion (not administered)  heparin injection 4,000 Units (4,000 Units Intravenous Given 01/12/16 0535)  fentaNYL (SUBLIMAZE) injection 100 mcg (100 mcg Intravenous Given 01/12/16 0535)  diltiazem (CARDIZEM) 1 mg/mL load via infusion 10 mg (10 mg Intravenous Bolus from Bag 01/12/16 1055)  amiodarone (NEXTERONE) 1.8 mg/mL load via infusion 150 mg (150 mg Intravenous Bolus from Bag 01/12/16 1810)  amiodarone (NEXTERONE) IV bolus only 150 mg/100 mL (150 mg Intravenous Given 01/12/16 1850)  heparin bolus via infusion 4,000 Units (4,000 Units Intravenous Bolus from Bag 01/13/16 0813)  potassium chloride SA (K-DUR,KLOR-CON) CR tablet 40 mEq (40 mEq Oral Given 01/13/16 1622)     Initial Impression / Assessment and Plan / ED Course  I have reviewed the triage vital signs and the nursing notes.  Pertinent labs & imaging results that were available during my care of the patient were reviewed by me and considered in my medical decision making (see chart for details).  Clinical Course     Pt has STEMI. Cards consulted.  Heparin and Aspirin given.   Final Clinical Impressions(s) / ED Diagnoses   Final diagnoses:  Status post coronary artery stent placement  Acute ST elevation myocardial infarction (STEMI) due to occlusion of right coronary artery [facility])    New Prescriptions Current Discharge Medication List       Derwood Kaplan, MD 01/16/16 2343

## 2016-01-16 NOTE — Progress Notes (Signed)
CARDIAC REHAB PHASE I   PRE:  Rate/Rhythm: 72 SR    BP: sitting 120/78    SaO2:   MODE:  Ambulation: 760 ft   POST:  Rate/Rhythm: 104 ST    BP: sitting 133/70     SaO2:   Tolerated well. Somewhat SOB after walking. Sts he has been feeling like that and got lasix over weekend. Increased distance and had quick pace. Ed completed with pt and wife including diet and ex and NTG. [id number]   Harriet Masson CES, ACSM 01/16/2016 12:04 PM

## 2016-01-17 ENCOUNTER — Encounter (HOSPITAL_COMMUNITY)
Admission: EM | Disposition: A | Discharge status: Home / Self Care | Payer: Self-pay | Attending: Cardiovascular Disease

## 2016-01-17 ENCOUNTER — Encounter (HOSPITAL_COMMUNITY): Payer: Self-pay | Admitting: General Practice

## 2016-01-17 DIAGNOSIS — Z955 Presence of coronary angioplasty implant and graft: Secondary | ICD-10-CM

## 2016-01-17 HISTORY — PX: CORONARY ANGIOPLASTY WITH STENT PLACEMENT: SHX49

## 2016-01-17 HISTORY — PX: CARDIAC CATHETERIZATION: SHX172

## 2016-01-17 LAB — BASIC METABOLIC PANEL
Anion gap: 9 (ref 5–15)
BUN: 18 mg/dL (ref 6–20)
CO2: 20 mmol/L — ABNORMAL LOW (ref 22–32)
CREATININE: 1.27 mg/dL — AB (ref 0.61–1.24)
Calcium: 8.4 mg/dL — ABNORMAL LOW (ref 8.9–10.3)
Chloride: 108 mmol/L (ref 101–111)
Glucose, Bld: 98 mg/dL (ref 65–99)
POTASSIUM: 3.7 mmol/L (ref 3.5–5.1)
SODIUM: 137 mmol/L (ref 135–145)

## 2016-01-17 LAB — CBC
HCT: 40.5 % (ref 39.0–52.0)
Hemoglobin: 14 g/dL (ref 13.0–17.0)
MCH: 30.1 pg (ref 26.0–34.0)
MCHC: 34.6 g/dL (ref 30.0–36.0)
MCV: 87.1 fL (ref 78.0–100.0)
PLATELETS: 184 10*3/uL (ref 150–400)
RBC: 4.65 MIL/uL (ref 4.22–5.81)
RDW: 13.2 % (ref 11.5–15.5)
WBC: 9.1 10*3/uL (ref 4.0–10.5)

## 2016-01-17 LAB — POCT ACTIVATED CLOTTING TIME: ACTIVATED CLOTTING TIME: 400 s

## 2016-01-17 SURGERY — CORONARY STENT INTERVENTION

## 2016-01-17 MED ORDER — MIDAZOLAM HCL 2 MG/2ML IJ SOLN
INTRAMUSCULAR | Status: DC | PRN
  Administered 2016-01-17 (×3): 1 mg via INTRAVENOUS
  Administered 2016-01-17: 2 mg via INTRAVENOUS

## 2016-01-17 MED ORDER — LIDOCAINE HCL (PF) 1 % IJ SOLN
INTRAMUSCULAR | Status: AC
  Filled 2016-01-17: qty 30

## 2016-01-17 MED ORDER — SODIUM CHLORIDE 0.9 % IV SOLN: 250.0000 mL | INTRAVENOUS | Status: DC | PRN

## 2016-01-17 MED ORDER — BIVALIRUDIN 250 MG IV SOLR
INTRAVENOUS | Status: AC
  Filled 2016-01-17: qty 250

## 2016-01-17 MED ORDER — IOPAMIDOL (ISOVUE-370) INJECTION 76%
INTRAVENOUS | Status: AC
  Filled 2016-01-17: qty 100

## 2016-01-17 MED ORDER — SODIUM CHLORIDE 0.9% FLUSH
3.0000 mL | Freq: Two times a day (BID) | INTRAVENOUS | Status: DC
  Administered 2016-01-17 – 2016-01-18 (×2): 3 mL via INTRAVENOUS

## 2016-01-17 MED ORDER — TICAGRELOR 90 MG PO TABS
90.0000 mg | ORAL_TABLET | Freq: Two times a day (BID) | ORAL | Status: DC
Start: 2016-01-17 — End: 2016-01-17

## 2016-01-17 MED ORDER — ANGIOPLASTY BOOK
Freq: Once | Status: AC
  Administered 2016-01-17: 20:00:00
  Filled 2016-01-17: qty 1

## 2016-01-17 MED ORDER — HEART ATTACK BOUNCING BOOK
Freq: Once | Status: AC
  Administered 2016-01-17: 20:00:00
  Filled 2016-01-17: qty 1

## 2016-01-17 MED ORDER — IOPAMIDOL (ISOVUE-370) INJECTION 76%
INTRAVENOUS | Status: DC | PRN
  Administered 2016-01-17: 370 mL via INTRA_ARTERIAL

## 2016-01-17 MED ORDER — SODIUM CHLORIDE 0.9% FLUSH: 3.0000 mL | INTRAVENOUS | Status: DC | PRN

## 2016-01-17 MED ORDER — NITROGLYCERIN 1 MG/10 ML FOR IR/CATH LAB
INTRA_ARTERIAL | Status: DC | PRN
  Administered 2016-01-17 (×7): 200 ug via INTRACORONARY

## 2016-01-17 MED ORDER — SODIUM CHLORIDE 0.9 % IV SOLN
INTRAVENOUS | Status: DC
  Administered 2016-01-17: 11:00:00 via INTRAVENOUS

## 2016-01-17 MED ORDER — IOPAMIDOL (ISOVUE-370) INJECTION 76%
INTRAVENOUS | Status: AC
  Filled 2016-01-17: qty 50

## 2016-01-17 MED ORDER — ONDANSETRON HCL 4 MG/2ML IJ SOLN: 4.0000 mg | Freq: Four times a day (QID) | INTRAMUSCULAR | Status: DC | PRN

## 2016-01-17 MED ORDER — FENTANYL CITRATE (PF) 100 MCG/2ML IJ SOLN
INTRAMUSCULAR | Status: AC
  Filled 2016-01-17: qty 2

## 2016-01-17 MED ORDER — IOPAMIDOL (ISOVUE-370) INJECTION 76%
INTRAVENOUS | Status: AC
  Filled 2016-01-17: qty 125

## 2016-01-17 MED ORDER — HEPARIN (PORCINE) IN NACL 2-0.9 UNIT/ML-% IJ SOLN
INTRAMUSCULAR | Status: DC | PRN
  Administered 2016-01-17: 1000 mL

## 2016-01-17 MED ORDER — ASPIRIN 81 MG PO CHEW: 81.0000 mg | CHEWABLE_TABLET | Freq: Every day | ORAL | Status: DC

## 2016-01-17 MED ORDER — MIDAZOLAM HCL 2 MG/2ML IJ SOLN
INTRAMUSCULAR | Status: AC
  Filled 2016-01-17: qty 2

## 2016-01-17 MED ORDER — SODIUM CHLORIDE 0.9 % IV SOLN
INTRAVENOUS | Status: DC | PRN
  Administered 2016-01-17 (×3): 1.75 mg/kg/h via INTRAVENOUS

## 2016-01-17 MED ORDER — BIVALIRUDIN BOLUS VIA INFUSION - CUPID
INTRAVENOUS | Status: DC | PRN
  Administered 2016-01-17: 85.05 mg via INTRAVENOUS

## 2016-01-17 MED ORDER — ATORVASTATIN CALCIUM 80 MG PO TABS: 80.0000 mg | ORAL_TABLET | Freq: Every day | ORAL | Status: DC

## 2016-01-17 MED ORDER — LABETALOL HCL 5 MG/ML IV SOLN: 10.0000 mg | INTRAVENOUS | Status: AC | PRN

## 2016-01-17 MED ORDER — ISOSORBIDE MONONITRATE ER 30 MG PO TB24
30.0000 mg | ORAL_TABLET | Freq: Every day | ORAL | Status: DC
  Administered 2016-01-17 – 2016-01-18 (×2): 30 mg via ORAL
  Filled 2016-01-17 (×2): qty 1

## 2016-01-17 MED ORDER — NITROGLYCERIN 1 MG/10 ML FOR IR/CATH LAB
INTRA_ARTERIAL | Status: AC
  Filled 2016-01-17: qty 10

## 2016-01-17 MED ORDER — FENTANYL CITRATE (PF) 100 MCG/2ML IJ SOLN
INTRAMUSCULAR | Status: DC | PRN
  Administered 2016-01-17 (×2): 25 ug via INTRAVENOUS
  Administered 2016-01-17: 50 ug via INTRAVENOUS
  Administered 2016-01-17: 25 ug via INTRAVENOUS

## 2016-01-17 MED ORDER — LIDOCAINE HCL (PF) 1 % IJ SOLN
INTRAMUSCULAR | Status: DC | PRN
  Administered 2016-01-17: 18 mL via INTRADERMAL

## 2016-01-17 MED ORDER — HYDRALAZINE HCL 20 MG/ML IJ SOLN: 5.0000 mg | INTRAMUSCULAR | Status: AC | PRN

## 2016-01-17 MED ORDER — ACETAMINOPHEN 325 MG PO TABS: 650.0000 mg | ORAL_TABLET | ORAL | Status: DC | PRN

## 2016-01-17 SURGICAL SUPPLY — 23 items
BALLN ANGIOSCULPT RX 2.0X10 (BALLOONS) ×3
BALLN ANGIOSCULPT RX 2.5X10 (BALLOONS) ×3
BALLN MOZEC 2.50X14 (BALLOONS) ×2
BALLN ~~LOC~~ EMERGE MR 2.75X12 (BALLOONS) ×3
BALLOON ANGIOSCULPT RX 2.0X10 (BALLOONS) ×1 IMPLANT
BALLOON ANGIOSCULPT RX 2.5X10 (BALLOONS) ×1 IMPLANT
BALLOON MOZEC 2.50X14 (BALLOONS) ×1 IMPLANT
BALLOON ~~LOC~~ EMERGE MR 2.75X12 (BALLOONS) ×1 IMPLANT
CATH EXPO 5FR FR4 (CATHETERS) ×3 IMPLANT
CATH VISTA GUIDE 6FR XBLAD4 (CATHETERS) ×3 IMPLANT
KIT ENCORE 26 ADVANTAGE (KITS) ×3 IMPLANT
KIT HEART LEFT (KITS) ×3 IMPLANT
PACK CARDIAC CATHETERIZATION (CUSTOM PROCEDURE TRAY) ×3 IMPLANT
SHEATH PINNACLE 6F 10CM (SHEATH) ×3 IMPLANT
STENT RESOLUTE ONYX 2.5X15 (Permanent Stent) ×3 IMPLANT
STENT RESOLUTE ONYX 2.5X30 (Permanent Stent) ×2 IMPLANT
STENT RESOLUTE ONYX 2.5X8 (Permanent Stent) ×2 IMPLANT
TRANSDUCER W/STOPCOCK (MISCELLANEOUS) ×3 IMPLANT
TUBING CIL FLEX 10 FLL-RA (TUBING) ×3 IMPLANT
WIRE ASAHI PROWATER 180CM (WIRE) ×3 IMPLANT
WIRE COUGAR XT STRL 190CM (WIRE) ×3 IMPLANT
WIRE EMERALD 3MM-J .035X150CM (WIRE) ×3 IMPLANT
WIRE LUGE 182CM (WIRE) ×3 IMPLANT

## 2016-01-17 NOTE — H&P (View-Only) (Signed)
Patient Name: Shawn Elliott Date of Encounter: 01/16/2016  Primary Cardiologist: Dr. Rachelle Hora Problem List     Active Problems:   Acute ST elevation myocardial infarction (STEMI) due to occlusion of right coronary artery (HCC)   Acute ST elevation myocardial infarction (STEMI) involving right coronary artery (HCC)   CAD (coronary artery disease), native coronary artery   Atrial fibrillation with rapid ventricular response (HCC)   Postinfarction angina (HCC)   Coronary artery disease involving native coronary artery with unstable angina pectoris (HCC)   Obesity (BMI 30-39.9)   Hyperlipidemia LDL goal <70   Elevated liver function tests     Subjective   Feels well, denies chest pain and SOB.   Inpatient Medications    Scheduled Meds: . aspirin  81 mg Oral Daily  . atorvastatin  80 mg Oral q1800  . furosemide  20 mg Oral Daily  . heparin subcutaneous  5,000 Units Subcutaneous Q8H  . metoprolol tartrate  12.5 mg Oral BID  . pantoprazole  40 mg Oral Daily  . sodium chloride flush  3 mL Intravenous Q12H  . ticagrelor  90 mg Oral BID   Continuous Infusions:  PRN Meds: sodium chloride, acetaminophen, alum & mag hydroxide-simeth, guaiFENesin-dextromethorphan, morphine injection, nitroGLYCERIN, ondansetron (ZOFRAN) IV, sodium chloride flush   Vital Signs    Vitals:   01/15/16 1004 01/15/16 1207 01/15/16 2014 01/16/16 0502  BP: (!) 99/56 116/71 119/65 118/68  Pulse:  78 79 81  Resp: 18 20 20 20   Temp:  98.4 F (36.9 C) 98.8 F (37.1 C) 99.3 F (37.4 C)  TempSrc:  Oral Oral Oral  SpO2: 99% 97% 98% 94%  Weight:    252 lb 14.4 oz (114.7 kg)  Height:        Intake/Output Summary (Last 24 hours) at 01/16/16 1011 Last data filed at 01/16/16 0907  Gross per 24 hour  Intake             1040 ml  Output              800 ml  Net              240 ml   Filed Weights   01/14/16 1626 01/15/16 0658 01/16/16 0502  Weight: 252 lb 3.3 oz (114.4 kg) 252 lb 6.4 oz  (114.5 kg) 252 lb 14.4 oz (114.7 kg)    Physical Exam    GEN: Well nourished, well developed, in no acute distress.  HEENT: Grossly normal.  Neck: Supple, no JVD, carotid bruits, or masses. Cardiac: RRR, no murmurs, rubs, or gallops. No clubbing, cyanosis, edema.  Radials/DP/PT 2+ and equal bilaterally.  Respiratory:  Respirations regular and unlabored, clear to auscultation bilaterally. GI: Soft, nontender, nondistended, BS + x 4. MS: no deformity or atrophy. Skin: warm and dry, no rash. Neuro:  Strength and sensation are intact. Psych: AAOx3.  Normal affect.  Labs    CBC  Recent Labs  01/15/16 0306 01/16/16 0302  WBC 17.5* 12.1*  HGB 14.1 13.7  HCT 41.5 40.3  MCV 88.9 88.8  PLT 178 168   Basic Metabolic Panel  Recent Labs  01/14/16 0316 01/16/16 0302  NA 137 137  K 4.3 3.5  CL 107 106  CO2 23 23  GLUCOSE 120* 120*  BUN 18 22*  CREATININE 1.14 1.34*  CALCIUM 8.7* 8.3*   Liver Function Tests  Recent Labs  01/14/16 0316  AST 117*  ALT 83*  ALKPHOS 63  BILITOT 1.1  PROT 5.7*  ALBUMIN 3.1*     Telemetry    NSR - Personally Reviewed  ECG    NSR, inc RBBB, mild residual inferior ST elevation, QTc 505 ms - Personally Reviewed  Radiology    No results found.  Cardiac Studies   Echo 01/14/16 - Left ventricle: The cavity size was mildly dilated. There was moderate concentric hypertrophy. Systolic function was mildly to moderately reduced. The estimated ejection fraction was in the range of 40% to 45%. Severe hypokinesis of the inferolateral and inferior myocardium. Features are consistent with a pseudonormal left ventricular filling pattern, with concomitant abnormal relaxation and increased filling pressure (grade 2 diastolic dysfunction). No evidence of thrombus. - Left atrium: The atrium was mildly dilated. - Right atrium: The atrium was mildly dilated. - Atrial septum: No defect or patent foramen ovale was identified. -  Pericardium, extracardiac: A trivial pericardial effusion was identified.  CATH:   Acute inferior lateral ST segment elevation myocardial infarction secondary to total occlusion of a large dominant RCA.  Multivessel coronary obstructive disease with 80% mid LAD stenosis before the takeoff of the second diagonal vessel; proximal left circumflex bifurcation stenosis with 75% stenosis in the proximal circumflex and 70% at the ostium of the OM1 vessel; 95% very proximal RCA stenosis with total occlusion of the mid RCA with significant thrombus and TIMI 0 flow. A mild collaterals to the PLA branch of the dominant RCA via the LAD.  Successful percutaneous coronary intervention to the large dominant RCA with PTCA and tandem stenting of the distal RCA with insertion of a 3.022 mm and 3.518 mm Resolute Onyx stent in the region of the acute margin extending to just proximal to the takeoff of the PDA with post stent dilatation with stent taper from 4.0 to 3.4 mm with the 100% occlusion being reduced to 0%; and PTCA/DES stenting of the proximal 95% stenosis with insertion of a 3.515 mm Resolute Onyx DES stent postdilated to 4.0 mm with the 95% stenosis being reduced to 0%. Initially there was significant thrombus burden which resolved. The patient developed atrial fibrillation during the procedure following reperfusion and received metoprolol IV.  Moderate acute LV dysfunction with contractility involving the entire inferior wall extending to and involving the apex; EF 40%.  Diagnostic Diagram     Post-Intervention Diagram         Patient Profile     61 year old male with a past medical history of HLD. Presented as an inferior STEMI and thrombotic occlusion of RCA requiring 3 DES. Plan for staged PCI to LAD and LCx on Tuesday 01/17/16.   Assessment & Plan    1. Acute inferior STEMI: Continue ASA, Brilinta, metoprolol. Plan for staged PCI to LCx and LAD tomorrow with Dr. Tresa Endo.  Patient is aware and agreeable.   2. Ischemic cardiomyopathy: LVEF 40-45%. Lasix started yesterday, 20mg  po. Needs ACE-I, but creatinine is 1.34 and needs PCI tomorrow. Will start ACE-I after cath tomorrow pending normalization of renal function.   3. Atrial fibrillation: In the setting of acute MI, resolved now. Was on Amio for a brief time. This has been discontinued. No anticoagulation unless reoccurence.   Signed, Little Ishikawa, NP  01/16/2016, 10:11 AM   I have personally seen and examined this patient with Suzzette Righter, NP I agree with the assessment and plan as outlined above. He was admitted with an inferior STEMI. He is doing well. Plans for staged PCI/stenting of the LAD and Circumflex tomorrow. Continue current meds.  Verne Carrow 01/16/2016 12:17 PM

## 2016-01-17 NOTE — Progress Notes (Signed)
Site area: right groin  Site Prior to Removal:  Level 1  Pressure Applied For 20 MINUTES    Minutes Beginning at 1340  Manual:   Yes.    Patient Status During Pull:  stable  Post Pull Groin Site:  Level 1  Post Pull Instructions Given:  Yes.    Post Pull Pulses Present:  Yes.    Dressing Applied:  Yes.    Comments:

## 2016-01-17 NOTE — Interval H&P Note (Signed)
Cath Lab Visit (complete for each Cath Lab visit)  Clinical Evaluation Leading to the Procedure:   ACS: Yes.    Non-ACS:    Anginal Classification: CCS IV  Anti-ischemic medical therapy: Minimal Therapy (1 class of medications)  Non-Invasive Test Results: No non-invasive testing performed  Prior CABG: No previous CABG      History and Physical Interval Note:  01/17/2016 7:45 AM  Shawn Elliott  has presented today for surgery, with the diagnosis of cad  The various methods of treatment have been discussed with the patient and family. After consideration of risks, benefits and other options for treatment, the patient has consented to  Procedure(s): Coronary Stent Intervention (N/A) as a surgical intervention .  The patient's history has been reviewed, patient examined, no change in status, stable for surgery.  I have reviewed the patient's chart and labs.  Questions were answered to the patient's satisfaction.     Nicki Guadalajara

## 2016-01-17 NOTE — Care Management Note (Signed)
Case Management Note  Patient Details  Name: Shawn Elliott MRN: [id number] Date of Birth: [birth year]  Subjective/Objective:    Patient lives with wife, sp coronary stent intervention, will be on brilinta, per previous NCM note patient's co payis 30.00. And he has the 30 day savings card.  NCM will cont to follow for dc needs.                Action/Plan:   Expected Discharge Date:                  Expected Discharge Plan:  Home/Self Care  In-House Referral:     Discharge planning Services  CM Consult, Medication Assistance  Post Acute Care Choice:    Choice offered to:     DME Arranged:    DME Agency:     HH Arranged:    HH Agency:     Status of Service:  Completed, signed off  If discussed at [employer] of Stay Meetings, dates discussed:    Additional Comments:  Leone Haven, RN 01/17/2016, 3:38 PM

## 2016-01-18 ENCOUNTER — Encounter (HOSPITAL_COMMUNITY): Payer: Self-pay | Admitting: Cardiovascular Disease

## 2016-01-18 LAB — BASIC METABOLIC PANEL
ANION GAP: 6 (ref 5–15)
BUN: 17 mg/dL (ref 6–20)
CHLORIDE: 106 mmol/L (ref 101–111)
CO2: 21 mmol/L — ABNORMAL LOW (ref 22–32)
Calcium: 8.2 mg/dL — ABNORMAL LOW (ref 8.9–10.3)
Creatinine, Ser: 1.15 mg/dL (ref 0.61–1.24)
GFR calc Af Amer: >60 mL/min (ref >60)
Glucose, Bld: 128 mg/dL — ABNORMAL HIGH (ref 65–99)
POTASSIUM: 3.5 mmol/L (ref 3.5–5.1)
SODIUM: 133 mmol/L — AB (ref 135–145)

## 2016-01-18 LAB — CBC
HCT: 36.3 % — ABNORMAL LOW (ref 39.0–52.0)
Hemoglobin: 12.4 g/dL — ABNORMAL LOW (ref 13.0–17.0)
MCH: 30 pg (ref 26.0–34.0)
MCHC: 34.2 g/dL (ref 30.0–36.0)
MCV: 87.7 fL (ref 78.0–100.0)
PLATELETS: 175 10*3/uL (ref 150–400)
RBC: 4.14 MIL/uL — AB (ref 4.22–5.81)
RDW: 13.4 % (ref 11.5–15.5)
WBC: 8.6 10*3/uL (ref 4.0–10.5)

## 2016-01-18 MED ORDER — FUROSEMIDE 10 MG/ML IJ SOLN
40.0000 mg | Freq: Once | INTRAMUSCULAR | Status: AC
Start: 2016-01-18 — End: 2016-01-18
  Administered 2016-01-18: 40 mg via INTRAVENOUS
  Filled 2016-01-18: qty 4

## 2016-01-18 MED ORDER — TICAGRELOR 90 MG PO TABS: 90.0000 mg | ORAL_TABLET | Freq: Two times a day (BID) | ORAL | 12 refills | Status: DC

## 2016-01-18 MED ORDER — ATORVASTATIN CALCIUM 80 MG PO TABS: 80.0000 mg | ORAL_TABLET | Freq: Every day | ORAL | 1 refills | Status: DC

## 2016-01-18 MED ORDER — FUROSEMIDE 10 MG/ML IJ SOLN: 40.0000 mg | Freq: Two times a day (BID) | INTRAMUSCULAR | Status: DC

## 2016-01-18 MED ORDER — POTASSIUM CHLORIDE ER 10 MEQ PO TBCR: 10.0000 meq | EXTENDED_RELEASE_TABLET | Freq: Every day | ORAL | 2 refills | Status: DC

## 2016-01-18 MED ORDER — METOPROLOL TARTRATE 25 MG PO TABS: 12.5000 mg | ORAL_TABLET | Freq: Two times a day (BID) | ORAL | 1 refills | Status: DC

## 2016-01-18 MED ORDER — FUROSEMIDE 20 MG PO TABS: 20.0000 mg | ORAL_TABLET | Freq: Every day | ORAL | 1 refills | Status: DC

## 2016-01-18 MED ORDER — ISOSORBIDE MONONITRATE ER 30 MG PO TB24: 30.0000 mg | ORAL_TABLET | Freq: Every day | ORAL | 1 refills | Status: DC

## 2016-01-18 MED ORDER — TICAGRELOR 90 MG PO TABS: 90.0000 mg | ORAL_TABLET | Freq: Two times a day (BID) | ORAL | 0 refills | Status: DC

## 2016-01-18 MED ORDER — NITROGLYCERIN 0.4 MG SL SUBL: 0.4000 mg | SUBLINGUAL_TABLET | SUBLINGUAL | 2 refills | Status: DC | PRN

## 2016-01-18 MED ORDER — LISINOPRIL 5 MG PO TABS
2.5000 mg | ORAL_TABLET | Freq: Every day | ORAL | Status: DC
  Administered 2016-01-18: 2.5 mg via ORAL
  Filled 2016-01-18: qty 1

## 2016-01-18 MED ORDER — POTASSIUM CHLORIDE CRYS ER 20 MEQ PO TBCR
20.0000 meq | EXTENDED_RELEASE_TABLET | Freq: Every day | ORAL | Status: DC
  Administered 2016-01-18: 20 meq via ORAL
  Filled 2016-01-18: qty 1

## 2016-01-18 MED ORDER — LISINOPRIL 2.5 MG PO TABS: 2.5000 mg | ORAL_TABLET | Freq: Every day | ORAL | 1 refills | Status: DC

## 2016-01-18 NOTE — Progress Notes (Signed)
CARDIAC REHAB PHASE I   PRE:  Rate/Rhythm: 80 SR  BP:  Sitting: 128/69        SaO2: 96 RA  MODE:  Ambulation: 800 ft   POST:  Rate/Rhythm: 85 SR  BP:  Sitting: 143/70         SaO2: 97 RA  Pt ambulated 800 ft on RA, independent, steady gait, tolerated well. Pt c/o mild DOE, increased fatigue with distance, states this is unchanged from previous walks, declined rest stop. VSS. Pt states he has no questions regarding education at this time. Pt to edge of bed per pt request after walk, call bell within reach.  [id number] Joylene Grapes, RN, BSN 01/18/2016 8:43 AM

## 2016-01-18 NOTE — Discharge Summary (Signed)
Discharge Summary    Patient ID: Shawn Elliott,  MRN: [id number], DOB/AGE: 60-30-1957 60 y.o.  Admit date: 01/12/2016 Discharge date: 01/18/2016  Primary Care Provider: Manfred Arch Primary Cardiologist: Dr. Tresa Endo   Discharge Diagnoses    Active Problems:   Acute ST elevation myocardial infarction (STEMI) due to occlusion of right coronary artery [facility])   Acute ST elevation myocardial infarction (STEMI) involving right coronary artery [facility])   Coronary artery disease involving native coronary artery of native heart with unstable angina pectoris (HCC)   Atrial fibrillation with rapid ventricular response (HCC)   Postinfarction angina (HCC)   Coronary artery disease involving native coronary artery with unstable angina pectoris (HCC)   Obesity (BMI 30-39.9)   Hyperlipidemia LDL goal <70   Elevated liver function tests   Status post coronary artery stent placement   Allergies Allergies  Allergen Reactions  . Penicillins Hives    Diagnostic Studies/Procedures    Coronary Stent Intervention  Left Heart Cath and Coronary Angiography 01/12/16   There is moderate left ventricular systolic dysfunction.  LV end diastolic pressure is mildly elevated.  Prox Cx to Mid Cx lesion, 75 %stenosed.  Ost 1st Mrg to 1st Mrg lesion, 70 %stenosed.  Mid LAD lesion, 80 %stenosed.  A drug eluting stent was successfully placed, and overlaps previously placed stent.  Mid RCA to Dist RCA lesion, 100 %stenosed.  Post intervention, there is a 0% residual stenosis.  A STENT RESOLUTE ONYX 3.5X15 drug eluting stent was successfully placed.  Prox RCA lesion, 95 %stenosed.  Post intervention, there is a 0% residual stenosis.   Acute inferior lateral ST segment elevation myocardial infarction secondary to total occlusion of a large dominant RCA.  Multivessel coronary obstructive disease with 80% mid LAD stenosis before the takeoff of the second diagonal vessel;  proximal left  circumflex bifurcation stenosis with 75% stenosis in the proximal circumflex and 70% at the ostium of the OM1 vessel; 95% very proximal RCA stenosis with total occlusion of the mid RCA with significant thrombus and TIMI 0 flow.  A mild collaterals to the PLA branch of the dominant RCA via the LAD.  Successful percutaneous coronary intervention to the large dominant RCA with PTCA and  tandem stenting of the distal RCA with insertion of a 3.022 mm and 3.518 mm Resolute Onyx stent in the region of the acute margin extending to just proximal to the takeoff of the PDA with post stent dilatation with stent taper from 4.0 to 3.4 mm with the 100% occlusion being reduced to 0%; and PTCA/DES stenting of the proximal 95% stenosis with insertion of a 3.515 mm Resolute Onyx DES stent postdilated to 4.0 mm with the 95% stenosis being reduced to 0%.  Initially there was significant thrombus burden which resolved.  The patient developed atrial fibrillation during the procedure following reperfusion and received metoprolol IV.  Moderate acute LV dysfunction with contractility involving the entire inferior wall extending to and involving the apex; EF 40%.  RECOMMENDATION: The patient will continue with DAPT for a minimum of a year.  He will be changed to high potency statin with atorvastatin 80 mg, and treated with beta blocker, ACE inhibitor and nitrate therapy.  Plan for staged PCI to his LAD and circumflex, probably prior to discharge if he remains stable.  Diagnostic Diagram     Post-Intervention Diagram       Coronary Balloon Angioplasty  Coronary Stent Intervention 01/17/16   Prox RCA lesion, 0 %stenosed.  Mid  RCA to Dist RCA lesion, 0 %stenosed.  Post Atrio-1 lesion, 30 %stenosed.  Post Atrio-2 lesion, 40 %stenosed.  Ramus lesion, 55 %stenosed.  Ost Cx lesion, 40 %stenosed.  Dist Cx lesion, 70 %stenosed.  Mid LAD-1 lesion, 45 %stenosed.  A STENT RESOLUTE ONYX 2.5X30 drug eluting stent  was successfully placed.  Prox Cx to Mid Cx lesion, 80 %stenosed.  Post intervention, there is a 0% residual stenosis.  Ost 1st Mrg to 1st Mrg lesion, 75 %stenosed.  Post intervention, there is a 35% residual stenosis.  A stent was successfully placed.  A STENT RESOLUTE ONYX 2.5X15 drug eluting stent was successfully placed.  Mid LAD-2 lesion, 80 %stenosed.  Post intervention, there is a 0% residual stenosis.   Widely patent stents in the proximal, and mid distal RCA in a very large dominant RCA vessel with mild 30 and 40% distal continuation branch stenoses.  Successful PTCA/DES stenting of the mid LAD 80% stenosis with insertion of a 2.515 mm Resolute Onyx DES stent postdilated to 2.64 mm with the 80% stenosis being reduced to 0%.  There was evidence for a small linear dissection in the region of the septal perforating artery which was stented with a tandem 2.58 mm  Resolute Onyx DES stent postdilated to 2.64 mm.  The entire stented segment was reduced to 0%.  There was brisk TIMI-3 flow.  There was no change in the 40-50% narrowing immediately after the takeoff of the first diagonal vessel, which was not intervened upon.  Successful intervention with angiosculpt going balloon dilatation of the left circumflex marginal 1 vessel with the 75% stenosis being reduced to 35% and Angiosculpt and DES stenting of tandem proximal and mid circumflex stenoses with a 2.5 30 mm Resolute Onyx stent postdilated to 2.65 mm with the stenoses being reduced to 0%.  RECOMMENDATION: The patient will continue with DAPT for a  minimum of a year and probably long-term.  Medical therapy for concomitant CAD.  Aggressive lipid intervention is necessary. Diagnostic Diagram     Post-Intervention Diagram          _____________   History of Present Illness     Shawn Elliott is a 61 year old male with a past medical history of HTN. He presented with chest pain that awakened him from sleep early in  the morning of 01/12/16. His pain was so severe that he could not go back to sleep, prompting him to call EMS. EKG showed inferolateral STEMI and he was transported to [facility] and taken urgently to the cath lab.   Hospital Course     His heart cath on 01/12/16 showed a total occlusion to his large dominant RCA. This was successfully stented using tandem stenting with 3 DES to his mid and distal RCA. There was also significant residual disease in his mid LAD (80%) and proximal left circumflex bifurcation stenosis (75%) and 70% at the ostium of the OM1 vessel. Plan was for staged PCI to his LAD and LCx.   He returned to the cath lab on 01/17/16 for staged PCI. He had PTCA/DES to the mid LAD with brisk TIMI -3 flow achieved. Also had successful intervention with angiosculpt and DES of tandem proximal and mid LCx stenosis. See diagrams above.   He will be on DAPT for at least a year, but likely long term. He had some mild concomitant CAD, and he was started on a high intensity statin. He had some brief period of atrial fib during the procedure and was  started on Amiodarone but this was discontinued as it was felt that it was related to the procedure and he did not have any reoccurrence while admitted.   His Echo showed an EF of 40-45%, he was started on an ACE inhibitor.   He was started on 30mg  isosorbide as well as 12.5mg  metoprolol BID. His right radial site is stable without hematoma and he has done well with cardiac rehab.   He was seen today by Dr. Tenny Craw and deemed suitable for discharge.  _____________  Discharge Vitals Blood pressure (!) 143/70, pulse 77, temperature 97.7 F (36.5 C), temperature source Oral, resp. rate (!) 29, height 6\' 2"  (1.88 m), weight 255 lb 15.3 oz (116.1 kg), SpO2 94 %.  Filed Weights   01/16/16 0502 01/17/16 0615 01/18/16 0300  Weight: 252 lb 14.4 oz (114.7 kg) 250 lb (113.4 kg) 255 lb 15.3 oz (116.1 kg)    Labs & Radiologic Studies     CBC  Recent Labs   01/17/16 0426 01/18/16 0248  WBC 9.1 8.6  HGB 14.0 12.4*  HCT 40.5 36.3*  MCV 87.1 87.7  PLT 184 175   Basic Metabolic Panel  Recent Labs  01/17/16 0426 01/18/16 0248  NA 137 133*  K 3.7 3.5  CL 108 106  CO2 20* 21*  GLUCOSE 98 128*  BUN 18 17  CREATININE 1.27* 1.15  CALCIUM 8.4* 8.2*    Disposition   Pt is being discharged home today in good condition.  Follow-up Plans & Appointments    Follow-up Information    Almond Lint, MD Follow up on 01/24/2016.   Specialty:  Cardiology Why:  at 3:00 pm for hospital follow up Contact information: [address] [place] [address] [phone]          Discharge Instructions    AMB Referral to Cardiac Rehabilitation - Phase II    Complete by:  As directed    Diagnosis:  STEMI   AMB Referral to Cardiac Rehabilitation - Phase II    Complete by:  As directed    Diagnosis:  STEMI   Amb Referral to Cardiac Rehabilitation    Complete by:  As directed    Pts cell/home number is [phone]. This is the best number to reach him (currently wrong in computer).   Diagnosis:   Coronary Stents STEMI PTCA     Diet - low sodium heart healthy    Complete by:  As directed    Discharge instructions    Complete by:  As directed    NO HEAVY LIFTING OR SEXUAL ACTIVITY for 7 DAYS. NO DRIVING for 3-5 DAYS. NO SOAKING BATHS, HOT TUBS, POOLS, ETC., for 7 DAYS   Radial Site Care Refer to this sheet in the next few weeks. These instructions provide you with information on caring for yourself after your procedure. Your caregiver may also give you more specific instructions. Your treatment has been planned according to current medical practices, but problems sometimes occur. Call your caregiver if you have any problems or questions after your procedure. HOME CARE INSTRUCTIONS You may shower the day after the procedure.Remove the bandage (dressing) and gently wash the site with plain soap and water.Gently pat the site dry.    Do not apply powder or lotion to the site.  Do not submerge the affected site in water for 3 to 5 days.  Inspect the site at least twice daily.  Do not flex or bend the affected arm for 24 hours.  No  lifting over 5 pounds (2.3 kg) for 5 days after your procedure.  Do not drive home if you are discharged the same day of the procedure. Have someone else drive you.  You may drive 24 hours after the procedure unless otherwise instructed by your caregiver.  What to expect: Any bruising will usually fade within 1 to 2 weeks.  Blood that collects in the tissue (hematoma) may be painful to the touch. It should usually decrease in size and tenderness within 1 to 2 weeks.  SEEK IMMEDIATE MEDICAL CARE IF: You have unusual pain at the radial site.  You have redness, warmth, swelling, or pain at the radial site.  You have drainage (other than a small amount of blood on the dressing).  You have chills.  You have a fever or persistent symptoms for more than 72 hours.  You have a fever and your symptoms suddenly get worse.  Your arm becomes pale, cool, tingly, or numb.  You have heavy bleeding from the site. Hold pressure on the site.   Increase activity slowly    Complete by:  As directed       Discharge Medications   Current Discharge Medication List    START taking these medications   Details  atorvastatin (LIPITOR) 80 MG tablet Take 1 tablet (80 mg total) by mouth daily at 6 PM. Qty: 30 tablet, Refills: 1    furosemide (LASIX) 20 MG tablet Take 1 tablet (20 mg total) by mouth daily. Qty: 30 tablet, Refills: 1    isosorbide mononitrate (IMDUR) 30 MG 24 hr tablet Take 1 tablet (30 mg total) by mouth daily. Qty: 30 tablet, Refills: 1    lisinopril (PRINIVIL,ZESTRIL) 2.5 MG tablet Take 1 tablet (2.5 mg total) by mouth daily. Qty: 30 tablet, Refills: 1    metoprolol tartrate (LOPRESSOR) 25 MG tablet Take 0.5 tablets (12.5 mg total) by mouth 2 (two) times daily. Qty: 30 tablet, Refills: 1     nitroGLYCERIN (NITROSTAT) 0.4 MG SL tablet Place 1 tablet (0.4 mg total) under the tongue every 5 (five) minutes as needed for chest pain. Qty: 25 tablet, Refills: 2    potassium chloride (K-DUR) 10 MEQ tablet Take 1 tablet (10 mEq total) by mouth daily. Qty: 30 tablet, Refills: 2    !! ticagrelor (BRILINTA) 90 MG TABS tablet Take 1 tablet (90 mg total) by mouth 2 (two) times daily. Qty: 60 tablet, Refills: 12    !! ticagrelor (BRILINTA) 90 MG TABS tablet Take 1 tablet (90 mg total) by mouth 2 (two) times daily. Qty: 60 tablet, Refills: 0     !! - Potential duplicate medications found. Please discuss with provider.    CONTINUE these medications which have NOT CHANGED   Details  aspirin 81 MG chewable tablet Chew 81 mg by mouth daily.    omeprazole (PRILOSEC) 20 MG capsule Take 20 mg by mouth daily.    tizanidine (ZANAFLEX) 2 MG capsule Take 2 mg by mouth 3 (three) times daily as needed for muscle spasms.      STOP taking these medications     amLODipine (NORVASC) 5 MG tablet      lovastatin (MEVACOR) 40 MG tablet      olmesartan-hydrochlorothiazide (BENICAR HCT) 40-12.5 MG tablet          Aspirin prescribed at discharge?  Yes High Intensity Statin Prescribed? (Lipitor 40-80mg  or Crestor 20-40mg ): Yes Beta Blocker Prescribed? Yes For EF 40% or less, Was ACEI/ARB Prescribed? Yes ADP Receptor Inhibitor Prescribed? (i.e.  Plavix etc.-Includes Medically Managed Patients): Yes For EF <40%, Aldosterone Inhibitor Prescribed? No: EF 40-45% Was EF assessed during THIS hospitalization? Yes Was Cardiac Rehab II ordered? (Included Medically managed Patients): Yes   Outstanding Labs/Studies     Duration of Discharge Encounter   Greater than 30 minutes including physician time.  Signed, Little Ishikawa NP 01/18/2016, 10:53 AM

## 2016-01-18 NOTE — Progress Notes (Signed)
Patient Name: Shawn Elliott Date of Encounter: 01/18/2016  Primary Cardiologist: Dr. Rachelle Hora Problem List     Active Problems:   Acute ST elevation myocardial infarction (STEMI) due to occlusion of right coronary artery (HCC)   Acute ST elevation myocardial infarction (STEMI) involving right coronary artery [facility])   Coronary artery disease involving native coronary artery of native heart with unstable angina pectoris (HCC)   Atrial fibrillation with rapid ventricular response (HCC)   Postinfarction angina (HCC)   Coronary artery disease involving native coronary artery with unstable angina pectoris (HCC)   Obesity (BMI 30-39.9)   Hyperlipidemia LDL goal <70   Elevated liver function tests   Status post coronary artery stent placement     Subjective   Feels well, denies chest pain and SOB.   Inpatient Medications    Scheduled Meds: . aspirin  81 mg Oral Daily  . atorvastatin  80 mg Oral q1800  . heparin subcutaneous  5,000 Units Subcutaneous Q8H  . isosorbide mononitrate  30 mg Oral Daily  . metoprolol tartrate  12.5 mg Oral BID  . pantoprazole  40 mg Oral Daily  . sodium chloride flush  3 mL Intravenous Q12H  . sodium chloride flush  3 mL Intravenous Q12H  . ticagrelor  90 mg Oral BID   Continuous Infusions: . sodium chloride Stopped (01/18/16 0500)   PRN Meds: sodium chloride, sodium chloride, acetaminophen, alum & mag hydroxide-simeth, guaiFENesin-dextromethorphan, morphine injection, nitroGLYCERIN, ondansetron (ZOFRAN) IV, sodium chloride flush, sodium chloride flush   Vital Signs    Vitals:   01/17/16 1800 01/17/16 1900 01/17/16 1913 01/18/16 0300  BP: 121/76  135/77 (!) 98/55  Pulse: 68  76 70  Resp: 19 (!) 23 15 (!) 29  Temp:   97.4 F (36.3 C) 97.5 F (36.4 C)  TempSrc:   Oral Oral  SpO2: 97%  94% 94%  Weight:    255 lb 15.3 oz (116.1 kg)  Height:        Intake/Output Summary (Last 24 hours) at 01/18/16 0804 Last data filed at 01/18/16  0400  Gross per 24 hour  Intake           2967.5 ml  Output             1475 ml  Net           1492.5 ml   Filed Weights   01/16/16 0502 01/17/16 0615 01/18/16 0300  Weight: 252 lb 14.4 oz (114.7 kg) 250 lb (113.4 kg) 255 lb 15.3 oz (116.1 kg)    Physical Exam    GEN: Well nourished, well developed, in no acute distress.  HEENT: Grossly normal.  Neck: Supple, no JVD, carotid bruits, or masses. Cardiac: RRR, no murmurs, rubs, or gallops. No clubbing, cyanosis, edema.  Radials/DP/PT 2+ and equal bilaterally.  Respiratory:  Respirations regular and unlabored, clear to auscultation bilaterally. GI: Soft, nontender, nondistended, BS + x 4. MS: no deformity or atrophy. Skin: warm and dry, no rash. Neuro:  Strength and sensation are intact. Psych: AAOx3.  Normal affect.  Labs    CBC  Recent Labs  01/17/16 0426 01/18/16 0248  WBC 9.1 8.6  HGB 14.0 12.4*  HCT 40.5 36.3*  MCV 87.1 87.7  PLT 184 175   Basic Metabolic Panel  Recent Labs  01/17/16 0426 01/18/16 0248  NA 137 133*  K 3.7 3.5  CL 108 106  CO2 20* 21*  GLUCOSE 98 128*  BUN 18 17  CREATININE 1.27* 1.15  CALCIUM 8.4* 8.2*     Telemetry    NSR- Personally Reviewed  ECG     NSR with anterolateral T wave inversion- Personally Reviewed  Radiology    No results found.  Cardiac Studies   Coronary Balloon Angioplasty  Coronary Stent Intervention    Prox RCA lesion, 0 %stenosed.  Mid RCA to Dist RCA lesion, 0 %stenosed.  Post Atrio-1 lesion, 30 %stenosed.  Post Atrio-2 lesion, 40 %stenosed.  Ramus lesion, 55 %stenosed.  Ost Cx lesion, 40 %stenosed.  Dist Cx lesion, 70 %stenosed.  Mid LAD-1 lesion, 45 %stenosed.  A STENT RESOLUTE ONYX 2.5X30 drug eluting stent was successfully placed.  Prox Cx to Mid Cx lesion, 80 %stenosed.  Post intervention, there is a 0% residual stenosis.  Ost 1st Mrg to 1st Mrg lesion, 75 %stenosed.  Post intervention, there is a 35% residual stenosis.  A  stent was successfully placed.  A STENT RESOLUTE ONYX 2.5X15 drug eluting stent was successfully placed.  Mid LAD-2 lesion, 80 %stenosed.  Post intervention, there is a 0% residual stenosis.   Widely patent stents in the proximal, and mid distal RCA in a very large dominant RCA vessel with mild 30 and 40% distal continuation branch stenoses.  Successful PTCA/DES stenting of the mid LAD 80% stenosis with insertion of a 2.515 mm Resolute Onyx DES stent postdilated to 2.64 mm with the 80% stenosis being reduced to 0%.  There was evidence for a small linear dissection in the region of the septal perforating artery which was stented with a tandem 2.58 mm  Resolute Onyx DES stent postdilated to 2.64 mm.  The entire stented segment was reduced to 0%.  There was brisk TIMI-3 flow.  There was no change in the 40-50% narrowing immediately after the takeoff of the first diagonal vessel, which was not intervened upon.  Successful intervention with angiosculpt going balloon dilatation of the left circumflex marginal 1 vessel with the 75% stenosis being reduced to 35% and Angiosculpt and DES stenting of tandem proximal and mid circumflex stenoses with a 2.5 30 mm Resolute Onyx stent postdilated to 2.65 mm with the stenoses being reduced to 0%.  RECOMMENDATION: The patient will continue with DAPT for a  minimum of a year and probably long-term.  Medical therapy for concomitant CAD.  Aggressive lipid intervention is necessary.    Patient Profile     61 yo with newly diagnosed multivessel CAD presenting with acute inferior STEMI and thrombotic occlusion of RCA requiring multiple drug-eluting stents. Returned to the cath lab on 01/17/16 for staged PCI to med LAD, and DES placed to tandem proximal and mid circumflex stenosis.   Assessment & Plan    1. S/P STEMI: continue asa, brilinta and statin. Continue metoprolol; add low dose ACE-I today. 2.5mg  lisinopril daily.   2. Multivessel CAD: s/p STEMI  with stents to RCA, and DES to circumflex and mid Circumflex.   3. Mild ischemic CMP/acute combined systolic/diastolic CHF: complains of DOE; add lasix 20 mg daily and follow renal function.   4. HLP: on high dose statin  5. AFib: CHADsvasc 3; brief, self limited in setting of acute MI. DC amiodarone. No anticoagulation long term unless recurs.   MD to advise on discharge.    Signed, Little Ishikawa, NP  01/18/2016, 8:04 AM   Pt seen and examined  Denies SOB   ON exam:  LUngs CTA  Cardiac RRR  No S3  Ext with 1+ edema I have reviewed  not by E SMith above  Agree with ASA/Brilinta and lipitor  Pt now on metoprolol and lisinopril  And imdur  Low dose   Denies dizziness   I would give 1 dose lasix IV  If OK then d/c  Continue low dose lasix /K at home  F/U in clinic   [facility]

## 2016-01-24 ENCOUNTER — Ambulatory Visit (INDEPENDENT_AMBULATORY_CARE_PROVIDER_SITE_OTHER): Payer: Managed Care, Other (non HMO) | Admitting: Cardiology

## 2016-01-24 ENCOUNTER — Encounter: Payer: Self-pay | Admitting: Cardiology

## 2016-01-24 ENCOUNTER — Telehealth: Payer: Self-pay | Admitting: Cardiology

## 2016-01-24 VITALS — BP 102/68 | HR 65 | Ht 74.0 in | Wt 244.8 lb

## 2016-01-24 DIAGNOSIS — I255 Ischemic cardiomyopathy: Secondary | ICD-10-CM

## 2016-01-24 DIAGNOSIS — I213 ST elevation (STEMI) myocardial infarction of unspecified site: Secondary | ICD-10-CM | POA: Diagnosis not present

## 2016-01-24 DIAGNOSIS — E785 Hyperlipidemia, unspecified: Secondary | ICD-10-CM | POA: Diagnosis not present

## 2016-01-24 DIAGNOSIS — I2510 Atherosclerotic heart disease of native coronary artery without angina pectoris: Secondary | ICD-10-CM

## 2016-01-24 NOTE — Patient Instructions (Addendum)
Medication Instructions:  Please continue your current medications  Labwork: CBC, CMET  Testing/Procedures: We have sent a referral to cardiac rehab They will review your chart and contact you with an appointment  Follow-Up: 1 months  If you need a refill on your cardiac medications before your next appointment, please call your pharmacy.   Cardiac Rehabilitation WHAT IS CARDIAC REHABILITATION? Cardiac rehabilitation is a treatment program that helps improve the health and well-being of people who have heart problems. Cardiac rehabilitation includes exercise training, education, and counseling to help you get stronger and return to an active lifestyle. This program can help you get better faster and reduce any future hospital stays. WHY MIGHT I NEED CARDIAC REHABILITATION? Cardiac rehabilitation programs can help when you have or have had:  A heart attack.  Heart failure.  Peripheral artery disease.  Coronary artery disease.  Angina.  Lung or breathing problems. Cardiac rehabilitation programs are also used when you have had:  Coronary artery bypass graft surgery.  Heart valve replacement.  Heart stent placement.  Heart transplant.  Aneurysm repair. WHAT ARE THE BENEFITS OF CARDIAC REHABILITATION? Cardiac rehabilitation can help:  Reduce problems like chest pain and trouble breathing.  Change risk factors that contribute to heart disease, such as:  Smoking.  High blood pressure.  High cholesterol.  Diabetes.  Being out of shape or not active.  Weighing more than 30% higher than your ideal weight.  Diet.  Improve your mental outlook so you feel:  More hopeful.  Better about yourself.  More confident about taking care of yourself.  Get support from health experts as well as other people with similar problems.  Learn how to manage and understand your medicines.  Teach your family about your condition and how to participate in your  recovery. WHAT HAPPENS IN CARDIAC REHABILITATION? You will be assessed by a cardiac rehabilitation team. They will check your health history and do a physical exam. You may need blood tests, stress tests, and other evaluations to make sure that you are ready to start cardiac rehabilitation. The cardiac rehabilitation team works with you to make a plan based on your health and goals. Your program will be tailored to fit you and your needs and may change as you progress. You may work with a health care team that includes:  Doctors.  Nurses.  Dietitians.  Psychologists.  Exercise specialists.  Physical and occupational therapists. WHAT ARE THE PHASES OF CARDIAC REHABILITATION? A cardiac rehabilitation program is often divided into phases. You advance from one phase to the next. Phase One This phase starts while you are still in the hospital. You may start by walking in your room and then in the hall. You may start some simple exercises with a therapist. Phase Two This phase begins when you go home or to another facility. This phase may last 8-12 weeks. You will travel to a cardiac rehabilitation center or another place where rehabilitation is offered. You will slowly increase your activity level while being closely watched by a nurse or therapist. Exercises may include a combination of strength or resistance training and "cardio" or aerobic movement on a treadmill or other machines. Your condition will determine how often and how long these sessions last. In phase two, you may learn how to cook healthy meals, control your blood sugar, and manage your medicines. You may need help with scheduling or planning how and when to take your medicines. If you have questions about your medicines, it is very important that  you talk to your health care provider. Phase Three This phase continues for the rest of your life. There will be less supervision. You may still participate in cardiac rehabilitation  activities or become part of a group in your community. You may benefit from talking about your experience with other people who are facing similar challenges. WHEN SHOULD I SEEK IMMEDIATE MEDICAL CARE? Seek immediate medical care if:  You have severe chest discomfort, especially if the pain is crushing or pressure-like and spreads to your arms, back, neck, or jaw. Do not wait to see if the pain will go away.  You have weakness or numbness in your face, arms, or legs, especially on one side of the body.  Your speech is slurred.  You are confused.  You have a sudden severe headache or loss of vision.  You have shortness of breath.  You are sweating and have nausea.  You feel dizzy or faint.  You are fatigued. These symptoms may represent a serious problem that is an emergency. Do not wait to see if the symptoms will go away. Get medical help right away. Call your local emergency services (911 in the [place] Do not drive yourself to the hospital. This information is not intended to replace advice given to you by your health care provider. Make sure you discuss any questions you have with your health care provider. Document Released: 10/04/2007 Document Revised: 05/05/2015 Document Reviewed: 11/08/2014 Elsevier Interactive Patient Education  2017 [employer].

## 2016-01-24 NOTE — Progress Notes (Signed)
Cardiology Office Note   Date:  01/24/2016   ID:  Shawn Elliott, DOB 10-19-1955, MRN [id number]  Referring Doctor:  Stoney Bang, MD   Cardiologist:   Wende Bushy, MD   Reason for consultation:  Chief Complaint  Patient presents with  . other     New Patient. TCM...s/p coronary stent intervention. Saw Dr Claiborne Billings at [facility]. Pt c/o sob at times. Reviewed meds with pt verbally.      History of Present Illness: RODD HEFT is a 61 y.o. male who presents for Hospital follow-up for high complexity diagnosis including ST elevation MI, multivessel CAD regarding PCI to RCA, LAD, circumflex  Review of medical records showed that he presented to [facility] 01/12/2016 for severe chest pain. EKG showed inferior lateral STEMI and he was taken to the Cath Lab urgently.  01/12/2016 cath showed total occlusion of large dominant RCA, this was status post PCI with 3 drug-eluting stents to mid and distal RCA. The residual disease in mid LAD and proximal left circumflex bifurcation stenosis and ostium of OM1 vessel were intervened on an 01/17/2016. He had PTCA/DES to the mid LAD with brisk TIMI -3 flow achieved. Also had successful intervention with angiosculpt and DES of tandem proximal and mid LCx stenosis.  He developed atrial fibrillation in relation to the cardiac catheterization. He was placed on amiodarone briefly but this was discontinued as it was thought the A. fib was related to the procedure. He had had no recurrence since then.  Since discharge, patient has been doing overall okay. His main issue is shortness of breath. This is an increased awareness of needing to take breaths. This happens randomly, not in relation to exertion. This only started while he was in the hospital. He denies PND, orthopnea, edema. No palpitations or dizziness. No loss of consciousness.   ROS:  Please see the history of present illness. Aside from mentioned under HPI, all other systems are reviewed and  negative.     Past Medical History:  Diagnosis Date  . Coronary artery disease   . GERD (gastroesophageal reflux disease)   . High cholesterol   . History of kidney stones    "passed them" (01/17/2016)  . Hypertension   . PONV (postoperative nausea and vomiting)    "when I woke up from my hernia I was nauseated"  . STEMI (ST elevation myocardial infarction) ([place]) 01/12/2016    Past Surgical History:  Procedure Laterality Date  . CARDIAC CATHETERIZATION N/A 01/12/2016   Procedure: Left Heart Cath and Coronary Angiography;  Surgeon: Troy Sine, MD;  Location: [place] [facility];  Service: Cardiovascular;  Laterality: N/A;  . CARDIAC CATHETERIZATION N/A 01/12/2016   Procedure: Coronary Stent Intervention;  Surgeon: Troy Sine, MD;  Location: [place] [facility];  Service: Cardiovascular;  Laterality: N/A;  . CARDIAC CATHETERIZATION N/A 01/17/2016   Procedure: Coronary Stent Intervention;  Surgeon: Troy Sine, MD;  Location: [place] [facility];  Service: Cardiovascular;  Laterality: N/A;  . CARDIAC CATHETERIZATION N/A 01/17/2016   Procedure: Coronary Balloon Angioplasty;  Surgeon: Troy Sine, MD;  Location: [place] [facility];  Service: Cardiovascular;  Laterality: N/A;  . COLONOSCOPY  ~ 2008  . CORONARY ANGIOPLASTY WITH STENT PLACEMENT  01/17/2016   "3 01/12/2016; 3 01/17/2016"  . GANGLION CYST EXCISION Right ~ 2016  . INGUINAL HERNIA REPAIR Right ~ 2014  . kidney stones  2010     reports that he quit smoking about 36  years ago. His smoking use included Pipe. He has never used smokeless tobacco. He reports that he drinks about 0.6 oz of alcohol per week . He reports that he does not use drugs.   family history includes Diabetes in his brother, father, and mother; Heart attack in his mother; Hyperlipidemia in his brother, father, mother, and sister; Hypertension in his brother, father, mother, and sister; Stroke in his father and mother.   Outpatient Medications Prior to Visit    Medication Sig Dispense Refill  . atorvastatin (LIPITOR) 80 MG tablet Take 1 tablet (80 mg total) by mouth daily at 6 PM. 30 tablet 1  . furosemide (LASIX) 20 MG tablet Take 1 tablet (20 mg total) by mouth daily. 30 tablet 1  . isosorbide mononitrate (IMDUR) 30 MG 24 hr tablet Take 1 tablet (30 mg total) by mouth daily. 30 tablet 1  . lisinopril (PRINIVIL,ZESTRIL) 2.5 MG tablet Take 1 tablet (2.5 mg total) by mouth daily. 30 tablet 1  . metoprolol tartrate (LOPRESSOR) 25 MG tablet Take 0.5 tablets (12.5 mg total) by mouth 2 (two) times daily. 30 tablet 1  . nitroGLYCERIN (NITROSTAT) 0.4 MG SL tablet Place 1 tablet (0.4 mg total) under the tongue every 5 (five) minutes as needed for chest pain. 25 tablet 2  . omeprazole (PRILOSEC) 20 MG capsule Take 20 mg by mouth daily.    . potassium chloride (K-DUR) 10 MEQ tablet Take 1 tablet (10 mEq total) by mouth daily. 30 tablet 2  . ticagrelor (BRILINTA) 90 MG TABS tablet Take 1 tablet (90 mg total) by mouth 2 (two) times daily. 60 tablet 12  . tizanidine (ZANAFLEX) 2 MG capsule Take 2 mg by mouth 3 (three) times daily as needed for muscle spasms.    Marland Kitchen aspirin 81 MG chewable tablet Chew 81 mg by mouth daily.    . ticagrelor (BRILINTA) 90 MG TABS tablet Take 1 tablet (90 mg total) by mouth 2 (two) times daily. (Patient not taking: Reported on 01/24/2016) 60 tablet 0   No facility-administered medications prior to visit.      Allergies: Penicillins    PHYSICAL EXAM: VS:  BP 102/68 (BP Location: Right Arm, Patient Position: Sitting, Cuff Size: Normal)   Pulse 65   Ht 6\' 2"  (1.88 m)   Wt 244 lb 12 oz (111 kg)   BMI 31.42 kg/m  , Body mass index is 31.42 kg/m. Wt Readings from Last 3 Encounters:  01/24/16 244 lb 12 oz (111 kg)  01/18/16 255 lb 15.3 oz (116.1 kg)    GENERAL:  well developed, well nourished, obese, not in acute distress HEENT: normocephalic, pink conjunctivae, anicteric sclerae, no xanthelasma, normal dentition, oropharynx  clear NECK:  no neck vein engorgement, JVP normal, no hepatojugular reflux, carotid upstroke brisk and symmetric, no bruit, no thyromegaly, no lymphadenopathy LUNGS:  good respiratory effort, clear to auscultation bilaterally CV:  PMI not displaced, no thrills, no lifts, S1 and S2 within normal limits, no palpable S3 or S4, no murmurs, no rubs, no gallops ABD:  Soft, nontender, nondistended, normoactive bowel sounds, no abdominal aortic bruit, no hepatomegaly, no splenomegaly MS: nontender back, no kyphosis, no scoliosis, no joint deformities EXT:  2+ DP/PT pulses, no edema, no varicosities, no cyanosis, no clubbing SKIN: warm, nondiaphoretic, normal turgor, no ulcers NEUROPSYCH: alert, oriented to person, place, and time, sensory/motor grossly intact, normal mood, appropriate affect  Recent Labs: 01/12/2016: Magnesium 1.8; TSH 0.416 01/14/2016: ALT 83; B Natriuretic Peptide 418.6 01/18/2016: BUN 17; Creatinine, Ser  1.15; Hemoglobin 12.4; Platelets 175; Potassium 3.5; Sodium 133   Lipid Panel    Component Value Date/Time   CHOL 266 (H) 01/12/2016 0535   TRIG 183 (H) 01/12/2016 0535   HDL 47 01/12/2016 0535   CHOLHDL 5.7 01/12/2016 0535   VLDL 37 01/12/2016 0535   LDLCALC 182 (H) 01/12/2016 0535     Other studies Reviewed:  EKG:  The ekg from 01/24/2016 was personally reviewed by me and it revealed sinus rhythm, 65 bpm. LAD. Q waves in inferior leads likely previous infarct.  Additional studies/ records that were reviewed personally reviewed by me today include:  Coronary Stent Intervention  Left Heart Cath and Coronary Angiography 01/12/16   There is moderate left ventricular systolic dysfunction.  LV end diastolic pressure is mildly elevated.  Prox Cx to Mid Cx lesion, 75 %stenosed.  Ost 1st Mrg to 1st Mrg lesion, 70 %stenosed.  Mid LAD lesion, 80 %stenosed.  A drug eluting stent was successfully placed, and overlaps previously placed stent.  Mid RCA to Dist RCA lesion, 100  %stenosed.  Post intervention, there is a 0% residual stenosis.  A STENT RESOLUTE ONYX 3.5X15 drug eluting stent was successfully placed.  Prox RCA lesion, 95 %stenosed.  Post intervention, there is a 0% residual stenosis.  Acute inferior lateral ST segment elevation myocardial infarction secondary to total occlusion of a large dominant RCA.  Multivessel coronary obstructive disease with 80% mid LAD stenosis before the takeoff of the second diagonal vessel; proximal left circumflex bifurcation stenosis with 75% stenosis in the proximal circumflex and 70% at the ostium of the OM1 vessel; 95% very proximal RCA stenosis with total occlusion of the mid RCA with significant thrombus and TIMI 0 flow. A mild collaterals to the PLA branch of the dominant RCA via the LAD.  Successful percutaneous coronary intervention to the large dominant RCA with PTCA and tandem stenting of the distal RCA with insertion of a 3.022 mm and 3.518 mm Resolute Onyx stent in the region of the acute margin extending to just proximal to the takeoff of the PDA with post stent dilatation with stent taper from 4.0 to 3.4 mm with the 100% occlusion being reduced to 0%; and PTCA/DES stenting of the proximal 95% stenosis with insertion of a 3.515 mm Resolute Onyx DES stent postdilated to 4.0 mm with the 95% stenosis being reduced to 0%. Initially there was significant thrombus burden which resolved. The patient developed atrial fibrillation during the procedure following reperfusion and received metoprolol IV.  Moderate acute LV dysfunction with contractility involving the entire inferior wall extending to and involving the apex; EF 40%.  Coronary Balloon Angioplasty  Coronary Stent Intervention 01/17/16   Prox RCA lesion, 0 %stenosed.  Mid RCA to Dist RCA lesion, 0 %stenosed.  Post Atrio-1 lesion, 30 %stenosed.  Post Atrio-2 lesion, 40 %stenosed.  [place] lesion, 55 %stenosed.  Ost Cx lesion, 40 %stenosed.  Dist  Cx lesion, 70 %stenosed.  Mid LAD-1 lesion, 45 %stenosed.  A STENT RESOLUTE ONYX 2.5X30 drug eluting stent was successfully placed.  Prox Cx to Mid Cx lesion, 80 %stenosed.  Post intervention, there is a 0% residual stenosis.  Ost 1st Mrg to 1st Mrg lesion, 75 %stenosed.  Post intervention, there is a 35% residual stenosis.  A stent was successfully placed.  A STENT RESOLUTE ONYX 2.5X15 drug eluting stent was successfully placed.  Mid LAD-2 lesion, 80 %stenosed.  Post intervention, there is a 0% residual stenosis.  Widely patent stents in the proximal, and  mid distal RCA in a very large dominant RCA vessel with mild 30 and 40% distal continuation branch stenoses.  Successful PTCA/DES stenting of the mid LAD 80% stenosis with insertion of a 2.515 mm Resolute Onyx DES stent postdilated to 2.64 mm with the 80% stenosis being reduced to 0%. There was evidence for a small linear dissection in the region of the septal perforating artery which was stented with a tandem 2.58 mm Resolute Onyx DES stent postdilated to 2.64 mm. The entire stented segment was reduced to 0%. There was brisk TIMI-3 flow. There was no change in the 40-50% narrowing immediately after the takeoff of the first diagonal vessel, which was not intervened upon.  Successful intervention with angiosculpt going balloon dilatation of the left circumflex marginal 1 vessel with the 75% stenosis being reduced to 35% and Angiosculpt and DES stenting of tandem proximal and mid circumflex stenoses with a 2.5 30 mm Resolute Onyx stent postdilated to 2.65 mm with the stenoses being reduced to 0%.  Echo 01/14/2016: Left ventricle: The cavity size was mildly dilated. There was   moderate concentric hypertrophy. Systolic function was mildly to   moderately reduced. The estimated ejection fraction was in the   range of 40% to 45%. Severe hypokinesis of the inferolateral and   inferior myocardium. Features are consistent with a  pseudonormal   left ventricular filling pattern, with concomitant abnormal   relaxation and increased filling pressure (grade 2 diastolic   dysfunction). No evidence of thrombus. - Aortic valve: Valve area (VTI): 4.34 cm^2. Valve area (Vmax):   4.65 cm^2. Valve area (Vmean): 4.23 cm^2. - Left atrium: The atrium was mildly dilated. - Right atrium: The atrium was mildly dilated. - Atrial septum: No defect or patent foramen ovale was identified. - Pericardium, extracardiac: A trivial pericardial effusion was   identified.   ASSESSMENT AND PLAN: Coronary artery disease, multivessel History of ST elevation MI due to occlusion of RCA status post PCI Resolute Onyx DES 01/12/2016 Status post resolute Onyx DES to mid LAD, Cx Currently no angina. Dyspnea is likely related to brillinta. Continue medical therapy with dual antiplatelet therapy for minimum of 1 year. Continue Imdur. Continue lisinopril 2.5 daily. Continue metoprolol 12.5 twice a day.  continue atorvastatin 80 daily at bedtime. We'll hold off on Lasix for now. Recheck CBC, CMP. Refer to cardiac rehabilitation. Repeat lipid panel in 3 months time. If LFTs remain to be significantly elevated, consider Repatha.  Atrial fibrillation, paroxysmal, related to heart catheterization procedure Likely procedurally related. Sinus rhythm now.  Cardiomyopathy likely ischemic, mild systolic, diastolic, chronic EF of 21-30% from echo 01/14/2016 Severe hypokinesis of inferior lateral and inferior myocardium No evidence of congestive heart failure today. We will hold off on Lasix for now. Continue medical therapy check labs.  Hyperlipidemia LDL goal less than 70. Lengthy and detailed discussion regarding importance of lifestyle changes. See discussion above under CAD.   Current medicines are reviewed at length with the patient today.  The patient does not have concerns regarding medicines.  Labs/ tests ordered today include:  Orders  Placed This Encounter  Procedures  . Comp Met (CMET)  . CBC with Differential/Platelet  . AMB referral to cardiac rehabilitation  . EKG 12-Lead    I had a lengthy and detailed discussion with the patient regarding diagnoses, prognosis, diagnostic options, treatment options, and side effects of medications.   I counseled the patient on importance of lifestyle modification including heart healthy diet, regular physical activity, and smoking cessation.  I spent at least 60 minutes with the patient today and more than 50% of the time was spent counseling the patient and coordinating care.   Disposition:   FU with undersigned In one month  Signed, Wende Bushy, MD  01/24/2016 5:24 PM    [place]  This note was generated in part with voice recognition software and I apologize for any typographical errors that were not detected and corrected.

## 2016-01-24 NOTE — Telephone Encounter (Signed)
Left message on pt's vm that Dr. Alvino Chapel completed his FMLA paperwork and that I am leaving the original in an envelope at the front desk for him to p/u at his convenience.  Asked him to call back w/ any questions or concerns.  Copy of forms on Pam's desk.

## 2016-01-25 LAB — COMPREHENSIVE METABOLIC PANEL
ALT: 34 IU/L (ref 0–44)
AST: 20 IU/L (ref 0–40)
Albumin/Globulin Ratio: 1.4 (ref 1.2–2.2)
Albumin: 4.2 g/dL (ref 3.5–5.5)
Alkaline Phosphatase: 104 IU/L (ref 39–117)
BILIRUBIN TOTAL: 0.7 mg/dL (ref 0.0–1.2)
BUN/Creatinine Ratio: 14 (ref 9–20)
BUN: 22 mg/dL (ref 6–24)
CALCIUM: 9.1 mg/dL (ref 8.7–10.2)
CHLORIDE: 100 mmol/L (ref 96–106)
CO2: 22 mmol/L (ref 18–29)
CREATININE: 1.54 mg/dL — AB (ref 0.76–1.27)
GFR calc Af Amer: 56 mL/min/{1.73_m2} — ABNORMAL LOW (ref >59)
GFR calc non Af Amer: 49 mL/min/{1.73_m2} — ABNORMAL LOW (ref >59)
GLUCOSE: 74 mg/dL (ref 65–99)
Globulin, Total: 3 g/dL (ref 1.5–4.5)
Potassium: 4.5 mmol/L (ref 3.5–5.2)
Sodium: 141 mmol/L (ref 134–144)
Total Protein: 7.2 g/dL (ref 6.0–8.5)

## 2016-01-25 LAB — CBC WITH DIFFERENTIAL/PLATELET
BASOS ABS: 0.1 10*3/uL (ref 0.0–0.2)
Basos: 1 %
EOS (ABSOLUTE): 0.2 10*3/uL (ref 0.0–0.4)
Eos: 2 %
HEMOGLOBIN: 13.7 g/dL (ref 13.0–17.7)
Hematocrit: 41.3 % (ref 37.5–51.0)
IMMATURE GRANS (ABS): 0.1 10*3/uL (ref 0.0–0.1)
Immature Granulocytes: 2 %
LYMPHS: 17 %
Lymphocytes Absolute: 1.5 10*3/uL (ref 0.7–3.1)
MCH: 29.6 pg (ref 26.6–33.0)
MCHC: 33.2 g/dL (ref 31.5–35.7)
MCV: 89 fL (ref 79–97)
MONOCYTES: 9 %
Monocytes Absolute: 0.8 10*3/uL (ref 0.1–0.9)
NEUTROS ABS: 6.3 10*3/uL (ref 1.4–7.0)
Neutrophils: 69 %
Platelets: 365 10*3/uL (ref 150–379)
RBC: 4.63 x10E6/uL (ref 4.14–5.80)
RDW: 13.5 % (ref 12.3–15.4)
WBC: 9 10*3/uL (ref 3.4–10.8)

## 2016-01-27 ENCOUNTER — Other Ambulatory Visit: Payer: Self-pay

## 2016-01-27 DIAGNOSIS — Z5181 Encounter for therapeutic drug level monitoring: Secondary | ICD-10-CM

## 2016-01-27 DIAGNOSIS — Z79899 Other long term (current) drug therapy: Principal | ICD-10-CM

## 2016-01-27 NOTE — Telephone Encounter (Signed)
Per pt request, forms faxed to [employer] [place], [place] @ [phone]. Pt will p/u originals at lab visit on Wed 1/24.

## 2016-02-01 ENCOUNTER — Encounter: Payer: Self-pay | Admitting: *Deleted

## 2016-02-01 ENCOUNTER — Other Ambulatory Visit (INDEPENDENT_AMBULATORY_CARE_PROVIDER_SITE_OTHER): Payer: Managed Care, Other (non HMO)

## 2016-02-01 ENCOUNTER — Telehealth: Payer: Self-pay | Admitting: Cardiology

## 2016-02-01 DIAGNOSIS — Z5181 Encounter for therapeutic drug level monitoring: Secondary | ICD-10-CM

## 2016-02-01 DIAGNOSIS — Z79899 Other long term (current) drug therapy: Secondary | ICD-10-CM | POA: Diagnosis not present

## 2016-02-01 NOTE — Telephone Encounter (Signed)
Spoke with patient and let him know that work release note would be up front for him to pick up. Also let him know that I spoke with Albin Felling in cardiac rehab and will send precert to her once we receive it. He verbalized understanding and had no further questions at this time.

## 2016-02-01 NOTE — Telephone Encounter (Signed)
Patient was here getting blood work done and wants to get a letter to return to work. He also states that cardiac rehab contacted him and that they have still not received authorization to get him scheduled. He also needed to pick up copy of forms that Dr. Alvino Chapel had filled out and wanted to know about cardiac rehab. Let him know that I would call them and speak with Dr. Alvino Chapel regarding returning to work and then give him a call. He verbalized understanding with no further questions at this time.

## 2016-02-01 NOTE — Telephone Encounter (Signed)
Left detailed voicemail message that our precert team checked with insurance and prior auth was not required for cardiac rehab. Carla notified at Cardiac rehab and stated she would be in touch with him to schedule and to call back if he has any further questions or needs.

## 2016-02-02 ENCOUNTER — Other Ambulatory Visit: Payer: Self-pay | Admitting: *Deleted

## 2016-02-02 LAB — COMPREHENSIVE METABOLIC PANEL
ALBUMIN: 3.7 g/dL (ref 3.5–5.5)
ALK PHOS: 101 IU/L (ref 39–117)
ALT: 23 IU/L (ref 0–44)
AST: 18 IU/L (ref 0–40)
Albumin/Globulin Ratio: 1.5 (ref 1.2–2.2)
BUN / CREAT RATIO: 15 (ref 9–20)
BUN: 18 mg/dL (ref 6–24)
Bilirubin Total: 0.5 mg/dL (ref 0.0–1.2)
CO2: 23 mmol/L (ref 18–29)
CREATININE: 1.17 mg/dL (ref 0.76–1.27)
Calcium: 9.2 mg/dL (ref 8.7–10.2)
Chloride: 105 mmol/L (ref 96–106)
GFR calc non Af Amer: 68 mL/min/{1.73_m2} (ref >59)
GFR, EST AFRICAN AMERICAN: 78 mL/min/{1.73_m2} (ref >59)
GLUCOSE: 111 mg/dL — AB (ref 65–99)
Globulin, Total: 2.5 g/dL (ref 1.5–4.5)
Potassium: 4.9 mmol/L (ref 3.5–5.2)
Sodium: 143 mmol/L (ref 134–144)
TOTAL PROTEIN: 6.2 g/dL (ref 6.0–8.5)

## 2016-02-02 MED ORDER — NITROGLYCERIN 0.4 MG SL SUBL: 0.4000 mg | SUBLINGUAL_TABLET | SUBLINGUAL | 2 refills | Status: DC | PRN

## 2016-02-02 MED ORDER — LISINOPRIL 2.5 MG PO TABS: 2.5000 mg | ORAL_TABLET | Freq: Every day | ORAL | 3 refills | Status: DC

## 2016-02-02 MED ORDER — TICAGRELOR 90 MG PO TABS: 90.0000 mg | ORAL_TABLET | Freq: Two times a day (BID) | ORAL | 3 refills | Status: DC

## 2016-02-02 MED ORDER — ATORVASTATIN CALCIUM 80 MG PO TABS: 80.0000 mg | ORAL_TABLET | Freq: Every day | ORAL | 3 refills | Status: DC

## 2016-02-02 MED ORDER — ISOSORBIDE MONONITRATE ER 30 MG PO TB24: 30.0000 mg | ORAL_TABLET | Freq: Every day | ORAL | 3 refills | Status: DC

## 2016-02-02 MED ORDER — METOPROLOL TARTRATE 25 MG PO TABS: 12.5000 mg | ORAL_TABLET | Freq: Two times a day (BID) | ORAL | 3 refills | Status: DC

## 2016-02-02 MED ORDER — NITROGLYCERIN 0.4 MG SL SUBL: 0.4000 mg | SUBLINGUAL_TABLET | SUBLINGUAL | 2 refills | Status: AC | PRN

## 2016-02-03 MED ORDER — ISOSORBIDE MONONITRATE ER 30 MG PO TB24: 30.0000 mg | ORAL_TABLET | Freq: Every day | ORAL | 3 refills | Status: DC

## 2016-02-03 NOTE — Addendum Note (Signed)
Addended by: Awilda Bill on: 02/03/2016 08:06 AM   Modules accepted: Orders

## 2016-02-06 ENCOUNTER — Other Ambulatory Visit: Payer: Self-pay | Admitting: *Deleted

## 2016-02-06 ENCOUNTER — Encounter: Payer: Managed Care, Other (non HMO) | Attending: Cardiology | Admitting: *Deleted

## 2016-02-06 VITALS — Ht 74.0 in | Wt 242.5 lb

## 2016-02-06 DIAGNOSIS — I213 ST elevation (STEMI) myocardial infarction of unspecified site: Secondary | ICD-10-CM | POA: Diagnosis not present

## 2016-02-06 DIAGNOSIS — Z955 Presence of coronary angioplasty implant and graft: Secondary | ICD-10-CM | POA: Diagnosis not present

## 2016-02-06 MED ORDER — ATORVASTATIN CALCIUM 80 MG PO TABS: 80.0000 mg | ORAL_TABLET | Freq: Every day | ORAL | 3 refills | Status: DC

## 2016-02-06 NOTE — Patient Instructions (Signed)
Patient Instructions  Patient Details  Name: Shawn Elliott MRN: [id number] Date of Birth: March 23, 1955 Referring Provider:  Almond Lint, MD  Below are the personal goals you chose as well as exercise and nutrition goals. Our goal is to help you keep on track towards obtaining and maintaining your goals. We will be discussing your progress on these goals with you throughout the program.  Initial Exercise Prescription:     Initial Exercise Prescription - 02/06/16 1500      Date of Initial Exercise RX and Referring Provider   Date 02/06/16   Referring Provider Almond Lint MD     Treadmill   MPH 2.5   Grade 0.5   Minutes 15   METs 3.09     REL-XR   Level 3   Minutes 15   METs 2     T5 Nustep   Level 3   Minutes 15   METs 2     Prescription Details   Frequency (times per week) 3   Duration Progress to 45 minutes of aerobic exercise without signs/symptoms of physical distress     Intensity   THRR 40-80% of Max Heartrate 103-142   Ratings of Perceived Exertion 11-15   Perceived Dyspnea 0-4     Progression   Progression Continue to progress workloads to maintain intensity without signs/symptoms of physical distress.     Resistance Training   Training Prescription Yes   Weight 3 lbs   Reps 10-15      Exercise Goals: Frequency: Be able to perform aerobic exercise three times per week working toward 3-5 days per week.  Intensity: Work with a perceived exertion of 11 (fairly light) - 15 (hard) as tolerated. Follow your new exercise prescription and watch for changes in prescription as you progress with the program. Changes will be reviewed with you when they are made.  Duration: You should be able to do 30 minutes of continuous aerobic exercise in addition to a 5 minute warm-up and a 5 minute cool-down routine.  Nutrition Goals: Your personal nutrition goals will be established when you do your nutrition analysis with the dietician.  The following are nutrition  guidelines to follow: Cholesterol < 200mg /day Sodium < 1500mg /day Fiber: Men over 50 yrs - 30 grams per day  Personal Goals:     Personal Goals and Risk Factors at Admission - 02/06/16 1350      Core Components/Risk Factors/Patient Goals on Admission    Weight Management Obesity;Weight Loss;Yes   Intervention Weight Management: Develop a combined nutrition and exercise program designed to reach desired caloric intake, while maintaining appropriate intake of nutrient and fiber, sodium and fats, and appropriate energy expenditure required for the weight goal.;Weight Management/Obesity: Establish reasonable short term and long term weight goals.;Obesity: Provide education and appropriate resources to help participant work on and attain dietary goals.   Admit Weight 242 lb 8 oz (110 kg)   Goal Weight: Short Term 238 lb (108 kg)   Goal Weight: Long Term 220 lb (99.8 kg)   Expected Outcomes Short Term: Continue to assess and modify interventions until short term weight is achieved;Long Term: Adherence to nutrition and physical activity/exercise program aimed toward attainment of established weight goal;Weight Loss: Understanding of general recommendations for a balanced deficit meal plan, which promotes 1-2 lb weight loss per week and includes a negative energy balance of [phone] kcal/d   Sedentary Yes   Intervention Provide advice, education, support and counseling about physical activity/exercise needs.;Develop an individualized exercise prescription  for aerobic and resistive training based on initial evaluation findings, risk stratification, comorbidities and participant's personal goals.   Expected Outcomes Achievement of increased cardiorespiratory fitness and enhanced flexibility, muscular endurance and strength shown through measurements of functional capacity and personal statement of participant.   Increase Strength and Stamina Yes   Intervention Provide advice, education, support and  counseling about physical activity/exercise needs.;Develop an individualized exercise prescription for aerobic and resistive training based on initial evaluation findings, risk stratification, comorbidities and participant's personal goals.   Expected Outcomes Achievement of increased cardiorespiratory fitness and enhanced flexibility, muscular endurance and strength shown through measurements of functional capacity and personal statement of participant.   Hypertension Yes   Intervention Provide education on lifestyle modifcations including regular physical activity/exercise, weight management, moderate sodium restriction and increased consumption of fresh fruit, vegetables, and low fat dairy, alcohol moderation, and smoking cessation.;Monitor prescription use compliance.   Expected Outcomes Short Term: Continued assessment and intervention until BP is < 140/22mm HG in hypertensive participants. < 130/72mm HG in hypertensive participants with diabetes, heart failure or chronic kidney disease.;Long Term: Maintenance of blood pressure at goal levels.   Lipids Yes   Intervention Provide education and support for participant on nutrition & aerobic/resistive exercise along with prescribed medications to achieve LDL 70mg , HDL >40mg .   Expected Outcomes Short Term: Participant states understanding of desired cholesterol values and is compliant with medications prescribed. Participant is following exercise prescription and nutrition guidelines.;Long Term: Cholesterol controlled with medications as prescribed, with individualized exercise RX and with personalized nutrition plan. Value goals: LDL < 70mg , HDL > 40 mg.      Tobacco Use Initial Evaluation: History  Smoking Status  . Former Smoker  . Types: Pipe  . Quit date: 1982  Smokeless Tobacco  . Never Used    Copy of goals given to participant.

## 2016-02-06 NOTE — Progress Notes (Signed)
Cardiac Individual Treatment Plan  Patient Details  Name: Shawn Elliott MRN: [id number] Date of Birth: 12-05-1955 Referring Provider:   Flowsheet Row Cardiac Rehab from 02/06/2016 in [facility] Cardiac and Pulmonary Rehab  Referring Provider  Almond Lint MD      Initial Encounter Date:  Flowsheet Row Cardiac Rehab from 02/06/2016 in [facility] Cardiac and Pulmonary Rehab  Date  02/06/16  Referring Provider  Almond Lint MD      Visit Diagnosis: ST elevation myocardial infarction (STEMI), unspecified artery [facility])  Status post coronary artery stent placement  Patient's Home Medications on Admission:  Current Outpatient Prescriptions:  .  aspirin EC 81 MG tablet, Take 81 mg by mouth daily., Disp: , Rfl:  .  atorvastatin (LIPITOR) 80 MG tablet, Take 1 tablet (80 mg total) by mouth daily at 6 PM., Disp: 90 tablet, Rfl: 3 .  isosorbide mononitrate (IMDUR) 30 MG 24 hr tablet, Take 1 tablet (30 mg total) by mouth daily., Disp: 90 tablet, Rfl: 3 .  lisinopril (PRINIVIL,ZESTRIL) 2.5 MG tablet, Take 1 tablet (2.5 mg total) by mouth daily., Disp: 90 tablet, Rfl: 3 .  metoprolol tartrate (LOPRESSOR) 25 MG tablet, Take 0.5 tablets (12.5 mg total) by mouth 2 (two) times daily., Disp: 90 tablet, Rfl: 3 .  nitroGLYCERIN (NITROSTAT) 0.4 MG SL tablet, Place 1 tablet (0.4 mg total) under the tongue every 5 (five) minutes as needed for chest pain., Disp: 25 tablet, Rfl: 2 .  omeprazole (PRILOSEC) 20 MG capsule, Take 20 mg by mouth daily., Disp: , Rfl:  .  ticagrelor (BRILINTA) 90 MG TABS tablet, Take 1 tablet (90 mg total) by mouth 2 (two) times daily., Disp: 180 tablet, Rfl: 3 .  tizanidine (ZANAFLEX) 2 MG capsule, Take 2 mg by mouth 3 (three) times daily as needed for muscle spasms., Disp: , Rfl:  .  furosemide (LASIX) 20 MG tablet, Take 1 tablet (20 mg total) by mouth daily. (Patient not taking: Reported on 02/06/2016), Disp: 30 tablet, Rfl: 1 .  potassium chloride (K-DUR) 10 MEQ tablet, Take 1 tablet (10 mEq  total) by mouth daily. (Patient not taking: Reported on 02/06/2016), Disp: 30 tablet, Rfl: 2  Past Medical History: Past Medical History:  Diagnosis Date  . Coronary artery disease   . GERD (gastroesophageal reflux disease)   . High cholesterol   . History of kidney stones    "passed them" (01/17/2016)  . Hypertension   . PONV (postoperative nausea and vomiting)    "when I woke up from my hernia I was nauseated"  . STEMI (ST elevation myocardial infarction) (HCC) 01/12/2016    Tobacco Use: History  Smoking Status  . Former Smoker  . Types: Pipe  . Quit date: 1982  Smokeless Tobacco  . Never Used    Labs: Recent Hydrographic surveyor    Labs for ITP Cardiac and Pulmonary Rehab Latest Ref Rng & Units 01/12/2016   Cholestrol 0 - 200 mg/dL 409(W)   LDLCALC 0 - 99 mg/dL 119(J)   HDL >47 mg/dL 47   Trlycerides <829 mg/dL 562(Z)   Hemoglobin H0Q 4.8 - 5.6 % 5.4   TCO2 0 - 100 mmol/L 21       Exercise Target Goals: Date: 02/06/16  Exercise Program Goal: Individual exercise prescription set with THRR, safety & activity barriers. Participant demonstrates ability to understand and report RPE using BORG scale, to self-measure pulse accurately, and to acknowledge the importance of the exercise prescription.  Exercise Prescription Goal: Starting with aerobic activity 30  plus minutes a day, 3 days per week for initial exercise prescription. Provide home exercise prescription and guidelines that participant acknowledges understanding prior to discharge.  Activity Barriers & Risk Stratification:     Activity Barriers & Cardiac Risk Stratification - 02/06/16 1400      Activity Barriers & Cardiac Risk Stratification   Activity Barriers --  Did state that right shoulder "catches" every now and then.    Cardiac Risk Stratification High      6 Minute Walk:     6 Minute Walk    Row Name 02/06/16 1516         6 Minute Walk   Phase Initial     Distance 1384 feet     Walk Time  6 minutes     # of Rest Breaks 0     MPH 2.62     METS 3.33     RPE 12     Perceived Dyspnea  1     VO2 Peak 11.67     Symptoms Yes (comment)     Comments some SOB     Resting HR 65 bpm     Resting BP 152/74     Max Ex. HR 83 bpm     Max Ex. BP 132/74     2 Minute Post BP 122/64        Initial Exercise Prescription:     Initial Exercise Prescription - 02/06/16 1500      Date of Initial Exercise RX and Referring Provider   Date 02/06/16   Referring Provider Almond Lint MD     Treadmill   MPH 2.5   Grade 0.5   Minutes 15   METs 3.09     REL-XR   Level 3   Minutes 15   METs 2     T5 Nustep   Level 3   Minutes 15   METs 2     Prescription Details   Frequency (times per week) 3   Duration Progress to 45 minutes of aerobic exercise without signs/symptoms of physical distress     Intensity   THRR 40-80% of Max Heartrate 103-142   Ratings of Perceived Exertion 11-15   Perceived Dyspnea 0-4     Progression   Progression Continue to progress workloads to maintain intensity without signs/symptoms of physical distress.     Resistance Training   Training Prescription Yes   Weight 3 lbs   Reps 10-15      Perform Capillary Blood Glucose checks as needed.  Exercise Prescription Changes:     Exercise Prescription Changes    Row Name 02/06/16 1400             Exercise Review   Progression -  walk test results         Response to Exercise   Blood Pressure (Admit) 152/74       Blood Pressure (Exercise) 132/74       Blood Pressure (Exit) 122/64       Heart Rate (Admit) 65 bpm       Heart Rate (Exercise) 83 bpm       Heart Rate (Exit) 66 bpm       Oxygen Saturation (Admit) 98 %       Oxygen Saturation (Exercise) 97 %       Rating of Perceived Exertion (Exercise) 12       Perceived Dyspnea (Exercise) 1       Symptoms some SOB  Exercise Comments:   Discharge Exercise Prescription (Final Exercise Prescription Changes):     Exercise  Prescription Changes - 02/06/16 1400      Exercise Review   Progression --  walk test results     Response to Exercise   Blood Pressure (Admit) 152/74   Blood Pressure (Exercise) 132/74   Blood Pressure (Exit) 122/64   Heart Rate (Admit) 65 bpm   Heart Rate (Exercise) 83 bpm   Heart Rate (Exit) 66 bpm   Oxygen Saturation (Admit) 98 %   Oxygen Saturation (Exercise) 97 %   Rating of Perceived Exertion (Exercise) 12   Perceived Dyspnea (Exercise) 1   Symptoms some SOB      Nutrition:  Target Goals: Understanding of nutrition guidelines, daily intake of sodium 1500mg , cholesterol 200mg , calories 30% from fat and 7% or less from saturated fats, daily to have 5 or more servings of fruits and vegetables.  Biometrics:     Pre Biometrics - 02/06/16 1519      Pre Biometrics   Height 6\' 2"  (1.88 m)   Weight 242 lb 8 oz (110 kg)   Waist Circumference 43 inches   Hip Circumference 43 inches   Waist to Hip Ratio 1 %   BMI (Calculated) 31.2   Single Leg Stand 30 seconds       Nutrition Therapy Plan and Nutrition Goals:     Nutrition Therapy & Goals - 02/06/16 1348      Intervention Plan   Intervention Prescribe, educate and counsel regarding individualized specific dietary modifications aiming towards targeted core components such as weight, hypertension, lipid management, diabetes, heart failure and other comorbidities.   Expected Outcomes Short Term Goal: Understand basic principles of dietary content, such as calories, fat, sodium, cholesterol and nutrients.;Short Term Goal: A plan has been developed with personal nutrition goals set during dietitian appointment.;Long Term Goal: Adherence to prescribed nutrition plan.      Nutrition Discharge: Rate Your Plate Scores:     Nutrition Assessments - 02/06/16 1349      MEDFICTS Scores   Pre Score 26      Nutrition Goals Re-Evaluation:   Psychosocial: Target Goals: Acknowledge presence or absence of depression,  maximize coping skills, provide positive support system. Participant is able to verbalize types and ability to use techniques and skills needed for reducing stress and depression.  Initial Review & Psychosocial Screening:     Initial Psych Review & Screening - 02/06/16 1351      Initial Review   Current issues with --  None reported. 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 Yes  Family and Friends     Barriers   Psychosocial barriers to participate in program There are no identifiable barriers or psychosocial needs.;The patient should benefit from training in stress management and relaxation.     Screening Interventions   Interventions Encouraged to exercise      Quality of Life Scores:     Quality of Life - 02/06/16 1357      Quality of Life Scores   Health/Function Pre 27.17 %   Socioeconomic Pre 28.27 %   Psych/Spiritual Pre 28.29 %   Family Pre 30 %   GLOBAL Pre 28.03 %      PHQ-9: Recent Review Flowsheet Data    Depression screen [facility] 2/9 02/06/2016   Decreased Interest  0   Down, Depressed, Hopeless 0   PHQ - 2 Score 0   Altered sleeping 0   Tired, decreased energy 0   Change in appetite 0   Feeling bad or failure about yourself  0   Trouble concentrating 0   Moving slowly or fidgety/restless 0   Suicidal thoughts 0   PHQ-9 Score 0   Difficult doing work/chores Not difficult at all      Psychosocial Evaluation and Intervention:   Psychosocial Re-Evaluation:   Vocational Rehabilitation: Provide vocational rehab assistance to qualifying candidates.   Vocational Rehab Evaluation & Intervention:     Vocational Rehab - 02/06/16 1359      Initial Vocational Rehab Evaluation & Intervention   Assessment shows need for Vocational Rehabilitation No      Education: Education Goals: Education classes will be provided  on a weekly basis, covering required topics. Participant will state understanding/return demonstration of topics presented.  Learning Barriers/Preferences:     Learning Barriers/Preferences - 02/06/16 1359      Learning Barriers/Preferences   Learning Barriers None   Learning Preferences None      Education Topics: General Nutrition Guidelines/Fats and Fiber: -Group instruction provided by verbal, written material, models and posters to present the general guidelines for heart healthy nutrition. Gives an explanation and review of dietary fats and fiber.   Controlling Sodium/Reading Food Labels: -Group verbal and written material supporting the discussion of sodium use in heart healthy nutrition. Review and explanation with models, verbal and written materials for utilization of the food label.   Exercise Physiology & Risk Factors: - Group verbal and written instruction with models to review the exercise physiology of the cardiovascular system and associated critical values. Details cardiovascular disease risk factors and the goals associated with each risk factor.   Aerobic Exercise & Resistance Training: - Gives group verbal and written discussion on the health impact of inactivity. On the components of aerobic and resistive training programs and the benefits of this training and how to safely progress through these programs.   Flexibility, Balance, General Exercise Guidelines: - Provides group verbal and written instruction on the benefits of flexibility and balance training programs. Provides general exercise guidelines with specific guidelines to those with heart or lung disease. Demonstration and skill practice provided.   Stress Management: - Provides group verbal and written instruction about the health risks of elevated stress, cause of high stress, and healthy ways to reduce stress.   Depression: - Provides group verbal and written instruction on the correlation  between heart/lung disease and depressed mood, treatment options, and the stigmas associated with seeking treatment.   Anatomy & Physiology of the Heart: - Group verbal and written instruction and models provide basic cardiac anatomy and physiology, with the coronary electrical and arterial systems. Review of: AMI, Angina, Valve disease, Heart Failure, Cardiac Arrhythmia, Pacemakers, and the ICD.   Cardiac Procedures: - Group verbal and written instruction and models to describe the testing methods done to diagnose heart disease. Reviews the outcomes of the test results. Describes the treatment choices: Medical Management, Angioplasty, or Coronary Bypass Surgery.   Cardiac Medications: - Group verbal and written instruction to review commonly prescribed medications for heart disease. Reviews the medication, class of the drug, and side effects. Includes the steps to properly store meds and maintain the prescription regimen.   Go Sex-Intimacy & Heart Disease, Get SMART - Goal Setting: - Group verbal and written instruction through game format to discuss heart disease and the  return to sexual intimacy. Provides group verbal and written material to discuss and apply goal setting through the application of the S.M.A.R.T. Method.   Other Matters of the Heart: - Provides group verbal, written materials and models to describe Heart Failure, Angina, Valve Disease, and Diabetes in the realm of heart disease. Includes description of the disease process and treatment options available to the cardiac patient.   Exercise & Equipment Safety: - Individual verbal instruction and demonstration of equipment use and safety with use of the equipment. Flowsheet Row Cardiac Rehab from 02/06/2016 in [facility] Cardiac and Pulmonary Rehab  Date  02/06/16  Educator  SB  Instruction Review Code  2- meets goals/outcomes      Infection Prevention: - Provides verbal and written material to individual with discussion of  infection control including proper hand washing and proper equipment cleaning during exercise session. Flowsheet Row Cardiac Rehab from 02/06/2016 in [facility] Cardiac and Pulmonary Rehab  Date  02/06/16  Educator  SB  Instruction Review Code  2- meets goals/outcomes      Falls Prevention: - Provides verbal and written material to individual with discussion of falls prevention and safety. Flowsheet Row Cardiac Rehab from 02/06/2016 in [facility] Cardiac and Pulmonary Rehab  Date  02/06/16  Educator  SB  Instruction Review Code  2- meets goals/outcomes      Diabetes: - Individual verbal and written instruction to review signs/symptoms of diabetes, desired ranges of glucose level fasting, after meals and with exercise. Advice that pre and post exercise glucose checks will be done for 3 sessions at entry of program.    Knowledge Questionnaire Score:     Knowledge Questionnaire Score - 02/06/16 1359      Knowledge Questionnaire Score   Pre Score 26/28 Correct responses reviewed with statement of understanding of response.      Core Components/Risk Factors/Patient Goals at Admission:     Personal Goals and Risk Factors at Admission - 02/06/16 1350      Core Components/Risk Factors/Patient Goals on Admission    Weight Management Obesity;Weight Loss;Yes   Intervention Weight Management: Develop a combined nutrition and exercise program designed to reach desired caloric intake, while maintaining appropriate intake of nutrient and fiber, sodium and fats, and appropriate energy expenditure required for the weight goal.;Weight Management/Obesity: Establish reasonable short term and long term weight goals.;Obesity: Provide education and appropriate resources to help participant work on and attain dietary goals.   Admit Weight 242 lb 8 oz (110 kg)   Goal Weight: Short Term 238 lb (108 kg)   Goal Weight: Long Term 220 lb (99.8 kg)   Expected Outcomes Short Term: Continue to assess and modify  interventions until short term weight is achieved;Long Term: Adherence to nutrition and physical activity/exercise program aimed toward attainment of established weight goal;Weight Loss: Understanding of general recommendations for a balanced deficit meal plan, which promotes 1-2 lb weight loss per week and includes a negative energy balance of [phone] kcal/d   Sedentary Yes   Intervention Provide advice, education, support and counseling about physical activity/exercise needs.;Develop an individualized exercise prescription for aerobic and resistive training based on initial evaluation findings, risk stratification, comorbidities and participant's personal goals.   Expected Outcomes Achievement of increased cardiorespiratory fitness and enhanced flexibility, muscular endurance and strength shown through measurements of functional capacity and personal statement of participant.   Increase Strength and Stamina Yes   Intervention Provide advice, education, support and counseling about physical activity/exercise needs.;Develop an individualized exercise prescription for aerobic and  resistive training based on initial evaluation findings, risk stratification, comorbidities and participant's personal goals.   Expected Outcomes Achievement of increased cardiorespiratory fitness and enhanced flexibility, muscular endurance and strength shown through measurements of functional capacity and personal statement of participant.   Hypertension Yes   Intervention Provide education on lifestyle modifcations including regular physical activity/exercise, weight management, moderate sodium restriction and increased consumption of fresh fruit, vegetables, and low fat dairy, alcohol moderation, and smoking cessation.;Monitor prescription use compliance.   Expected Outcomes Short Term: Continued assessment and intervention until BP is < 140/56mm HG in hypertensive participants. < 130/18mm HG in hypertensive participants with  diabetes, heart failure or chronic kidney disease.;Long Term: Maintenance of blood pressure at goal levels.   Lipids Yes   Intervention Provide education and support for participant on nutrition & aerobic/resistive exercise along with prescribed medications to achieve LDL 70mg , HDL >40mg .   Expected Outcomes Short Term: Participant states understanding of desired cholesterol values and is compliant with medications prescribed. Participant is following exercise prescription and nutrition guidelines.;Long Term: Cholesterol controlled with medications as prescribed, with individualized exercise RX and with personalized nutrition plan. Value goals: LDL < 70mg , HDL > 40 mg.      Core Components/Risk Factors/Patient Goals Review:    Core Components/Risk Factors/Patient Goals at Discharge (Final Review):    ITP Comments:     ITP Comments    Row Name 02/06/16 1346           ITP Comments Medical review completed. Initial ITP created. Documentation of diagnosis can be found in [facility] Visit 01/12/2016          Comments: Initial ITP

## 2016-02-08 ENCOUNTER — Encounter: Payer: Self-pay | Admitting: *Deleted

## 2016-02-08 DIAGNOSIS — Z955 Presence of coronary angioplasty implant and graft: Secondary | ICD-10-CM

## 2016-02-08 DIAGNOSIS — I213 ST elevation (STEMI) myocardial infarction of unspecified site: Secondary | ICD-10-CM

## 2016-02-08 NOTE — Progress Notes (Signed)
Daily Session Note  Patient Details  Name: Shawn Elliott MRN: [id number] Date of Birth: Jun 03, 1955 Referring Provider:   Flowsheet Row Cardiac Rehab from 02/06/2016 in [facility] Cardiac and Pulmonary Rehab  Referring Provider  Wende Bushy MD      Encounter Date: 02/08/2016  Check In:     Session Check In - 02/08/16 0905      Check-In   Location [facility]-Cardiac & Pulmonary Rehab   Staff Present Nyoka Cowden, RN, BSN, Willette Pa, MA, ACSM RCEP, Exercise Physiologist;Shalene Gallen Oletta Darter, [place], ACSM CEP, Exercise Physiologist   Supervising physician immediately available to respond to emergencies See telemetry face sheet for immediately available ER MD   Medication changes reported     No   Fall or balance concerns reported    No   Warm-up and Cool-down Performed on first and last piece of equipment   Resistance Training Performed Yes   VAD Patient? No     Pain Assessment   Currently in Pain? No/denies         Goals Met:  Independence with exercise equipment Exercise tolerated well No report of cardiac concerns or symptoms Strength training completed today  Goals Unmet:  Not Applicable  Comments: First full day of exercise!  Patient was oriented to gym and equipment including functions, settings, policies, and procedures.  Patient's individual exercise prescription and treatment plan were reviewed.  All starting workloads were established based on the results of the 6 minute walk test done at initial orientation visit.  The plan for exercise progression was also introduced and progression will be customized based on patient's performance and goals.    Dr. Emily Filbert is Medical Director for [facility].

## 2016-02-08 NOTE — Progress Notes (Signed)
Cardiac Individual Treatment Plan  Patient Details  Name: Shawn Elliott MRN: [id number] Date of Birth: 03-11-1955 Referring Provider:   Flowsheet Row Cardiac Rehab from 02/06/2016 in [facility] Cardiac and Pulmonary Rehab  Referring Provider  Almond Lint MD      Initial Encounter Date:  Flowsheet Row Cardiac Rehab from 02/06/2016 in [facility] Cardiac and Pulmonary Rehab  Date  02/06/16  Referring Provider  Almond Lint MD      Visit Diagnosis: ST elevation myocardial infarction (STEMI), unspecified artery [facility])  Status post coronary artery stent placement  Patient's Home Medications on Admission:  Current Outpatient Prescriptions:  .  aspirin EC 81 MG tablet, Take 81 mg by mouth daily., Disp: , Rfl:  .  atorvastatin (LIPITOR) 80 MG tablet, Take 1 tablet (80 mg total) by mouth daily at 6 PM., Disp: 90 tablet, Rfl: 3 .  furosemide (LASIX) 20 MG tablet, Take 1 tablet (20 mg total) by mouth daily. (Patient not taking: Reported on 02/06/2016), Disp: 30 tablet, Rfl: 1 .  isosorbide mononitrate (IMDUR) 30 MG 24 hr tablet, Take 1 tablet (30 mg total) by mouth daily., Disp: 90 tablet, Rfl: 3 .  lisinopril (PRINIVIL,ZESTRIL) 2.5 MG tablet, Take 1 tablet (2.5 mg total) by mouth daily., Disp: 90 tablet, Rfl: 3 .  metoprolol tartrate (LOPRESSOR) 25 MG tablet, Take 0.5 tablets (12.5 mg total) by mouth 2 (two) times daily., Disp: 90 tablet, Rfl: 3 .  nitroGLYCERIN (NITROSTAT) 0.4 MG SL tablet, Place 1 tablet (0.4 mg total) under the tongue every 5 (five) minutes as needed for chest pain., Disp: 25 tablet, Rfl: 2 .  omeprazole (PRILOSEC) 20 MG capsule, Take 20 mg by mouth daily., Disp: , Rfl:  .  potassium chloride (K-DUR) 10 MEQ tablet, Take 1 tablet (10 mEq total) by mouth daily. (Patient not taking: Reported on 02/06/2016), Disp: 30 tablet, Rfl: 2 .  ticagrelor (BRILINTA) 90 MG TABS tablet, Take 1 tablet (90 mg total) by mouth 2 (two) times daily., Disp: 180 tablet, Rfl: 3 .  tizanidine (ZANAFLEX) 2 MG  capsule, Take 2 mg by mouth 3 (three) times daily as needed for muscle spasms., Disp: , Rfl:   Past Medical History: Past Medical History:  Diagnosis Date  . Coronary artery disease   . GERD (gastroesophageal reflux disease)   . High cholesterol   . History of kidney stones    "passed them" (01/17/2016)  . Hypertension   . PONV (postoperative nausea and vomiting)    "when I woke up from my hernia I was nauseated"  . STEMI (ST elevation myocardial infarction) (HCC) 01/12/2016    Tobacco Use: History  Smoking Status  . Former Smoker  . Types: Pipe  . Quit date: 1982  Smokeless Tobacco  . Never Used    Labs: Recent Airline pilot for ITP Cardiac and Pulmonary Rehab Latest Ref Rng & Units 01/12/2016   Cholestrol 0 - 200 mg/dL 409(W)   LDLCALC 0 - 99 mg/dL 119(J)   HDL >47 mg/dL 47   Trlycerides <829 mg/dL 562(Z)   Hemoglobin H0Q 4.8 - 5.6 % 5.4   TCO2 0 - 100 mmol/L 21       Exercise Target Goals:    Exercise Program Goal: Individual exercise prescription set with THRR, safety & activity barriers. Participant demonstrates ability to understand and report RPE using BORG scale, to self-measure pulse accurately, and to acknowledge the importance of the exercise prescription.  Exercise Prescription Goal: Starting with aerobic activity 30  plus minutes a day, 3 days per week for initial exercise prescription. Provide home exercise prescription and guidelines that participant acknowledges understanding prior to discharge.  Activity Barriers & Risk Stratification:     Activity Barriers & Cardiac Risk Stratification - 02/06/16 1400      Activity Barriers & Cardiac Risk Stratification   Activity Barriers --  Did state that right shoulder "catches" every now and then.    Cardiac Risk Stratification High      6 Minute Walk:     6 Minute Walk    Row Name 02/06/16 1516         6 Minute Walk   Phase Initial     Distance 1384 feet     Walk Time 6 minutes      # of Rest Breaks 0     MPH 2.62     METS 3.33     RPE 12     Perceived Dyspnea  1     VO2 Peak 11.67     Symptoms Yes (comment)     Comments some SOB     Resting HR 65 bpm     Resting BP 152/74     Max Ex. HR 83 bpm     Max Ex. BP 132/74     2 Minute Post BP 122/64        Initial Exercise Prescription:     Initial Exercise Prescription - 02/06/16 1500      Date of Initial Exercise RX and Referring Provider   Date 02/06/16   Referring Provider Almond Lint MD     Treadmill   MPH 2.5   Grade 0.5   Minutes 15   METs 3.09     REL-XR   Level 3   Minutes 15   METs 2     T5 Nustep   Level 3   Minutes 15   METs 2     Prescription Details   Frequency (times per week) 3   Duration Progress to 45 minutes of aerobic exercise without signs/symptoms of physical distress     Intensity   THRR 40-80% of Max Heartrate 103-142   Ratings of Perceived Exertion 11-15   Perceived Dyspnea 0-4     Progression   Progression Continue to progress workloads to maintain intensity without signs/symptoms of physical distress.     Resistance Training   Training Prescription Yes   Weight 3 lbs   Reps 10-15      Perform Capillary Blood Glucose checks as needed.  Exercise Prescription Changes:     Exercise Prescription Changes    Row Name 02/06/16 1400             Exercise Review   Progression -  walk test results         Response to Exercise   Blood Pressure (Admit) 152/74       Blood Pressure (Exercise) 132/74       Blood Pressure (Exit) 122/64       Heart Rate (Admit) 65 bpm       Heart Rate (Exercise) 83 bpm       Heart Rate (Exit) 66 bpm       Oxygen Saturation (Admit) 98 %       Oxygen Saturation (Exercise) 97 %       Rating of Perceived Exertion (Exercise) 12       Perceived Dyspnea (Exercise) 1       Symptoms some SOB  Exercise Comments:   Discharge Exercise Prescription (Final Exercise Prescription Changes):     Exercise Prescription  Changes - 02/06/16 1400      Exercise Review   Progression --  walk test results     Response to Exercise   Blood Pressure (Admit) 152/74   Blood Pressure (Exercise) 132/74   Blood Pressure (Exit) 122/64   Heart Rate (Admit) 65 bpm   Heart Rate (Exercise) 83 bpm   Heart Rate (Exit) 66 bpm   Oxygen Saturation (Admit) 98 %   Oxygen Saturation (Exercise) 97 %   Rating of Perceived Exertion (Exercise) 12   Perceived Dyspnea (Exercise) 1   Symptoms some SOB      Nutrition:  Target Goals: Understanding of nutrition guidelines, daily intake of sodium 1500mg , cholesterol 200mg , calories 30% from fat and 7% or less from saturated fats, daily to have 5 or more servings of fruits and vegetables.  Biometrics:     Pre Biometrics - 02/06/16 1519      Pre Biometrics   Height 6\' 2"  (1.88 m)   Weight 242 lb 8 oz (110 kg)   Waist Circumference 43 inches   Hip Circumference 43 inches   Waist to Hip Ratio 1 %   BMI (Calculated) 31.2   Single Leg Stand 30 seconds       Nutrition Therapy Plan and Nutrition Goals:     Nutrition Therapy & Goals - 02/06/16 1348      Intervention Plan   Intervention Prescribe, educate and counsel regarding individualized specific dietary modifications aiming towards targeted core components such as weight, hypertension, lipid management, diabetes, heart failure and other comorbidities.   Expected Outcomes Short Term Goal: Understand basic principles of dietary content, such as calories, fat, sodium, cholesterol and nutrients.;Short Term Goal: A plan has been developed with personal nutrition goals set during dietitian appointment.;Long Term Goal: Adherence to prescribed nutrition plan.      Nutrition Discharge: Rate Your Plate Scores:     Nutrition Assessments - 02/06/16 1349      MEDFICTS Scores   Pre Score 26      Nutrition Goals Re-Evaluation:   Psychosocial: Target Goals: Acknowledge presence or absence of depression, maximize coping  skills, provide positive support system. Participant is able to verbalize types and ability to use techniques and skills needed for reducing stress and depression.  Initial Review & Psychosocial Screening:     Initial Psych Review & Screening - 02/06/16 1351      Initial Review   Current issues with --  None reported. 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 Yes  Family and Friends     Barriers   Psychosocial barriers to participate in program There are no identifiable barriers or psychosocial needs.;The patient should benefit from training in stress management and relaxation.     Screening Interventions   Interventions Encouraged to exercise      Quality of Life Scores:     Quality of Life - 02/06/16 1357      Quality of Life Scores   Health/Function Pre 27.17 %   Socioeconomic Pre 28.27 %   Psych/Spiritual Pre 28.29 %   Family Pre 30 %   GLOBAL Pre 28.03 %      PHQ-9: Recent Review Flowsheet Data    Depression screen [facility] 2/9 02/06/2016   Decreased Interest  0   Down, Depressed, Hopeless 0   PHQ - 2 Score 0   Altered sleeping 0   Tired, decreased energy 0   Change in appetite 0   Feeling bad or failure about yourself  0   Trouble concentrating 0   Moving slowly or fidgety/restless 0   Suicidal thoughts 0   PHQ-9 Score 0   Difficult doing work/chores Not difficult at all      Psychosocial Evaluation and Intervention:   Psychosocial Re-Evaluation:   Vocational Rehabilitation: Provide vocational rehab assistance to qualifying candidates.   Vocational Rehab Evaluation & Intervention:     Vocational Rehab - 02/06/16 1359      Initial Vocational Rehab Evaluation & Intervention   Assessment shows need for Vocational Rehabilitation No      Education: Education Goals: Education classes will be provided on a weekly  basis, covering required topics. Participant will state understanding/return demonstration of topics presented.  Learning Barriers/Preferences:     Learning Barriers/Preferences - 02/06/16 1359      Learning Barriers/Preferences   Learning Barriers None   Learning Preferences None      Education Topics: General Nutrition Guidelines/Fats and Fiber: -Group instruction provided by verbal, written material, models and posters to present the general guidelines for heart healthy nutrition. Gives an explanation and review of dietary fats and fiber.   Controlling Sodium/Reading Food Labels: -Group verbal and written material supporting the discussion of sodium use in heart healthy nutrition. Review and explanation with models, verbal and written materials for utilization of the food label.   Exercise Physiology & Risk Factors: - Group verbal and written instruction with models to review the exercise physiology of the cardiovascular system and associated critical values. Details cardiovascular disease risk factors and the goals associated with each risk factor.   Aerobic Exercise & Resistance Training: - Gives group verbal and written discussion on the health impact of inactivity. On the components of aerobic and resistive training programs and the benefits of this training and how to safely progress through these programs.   Flexibility, Balance, General Exercise Guidelines: - Provides group verbal and written instruction on the benefits of flexibility and balance training programs. Provides general exercise guidelines with specific guidelines to those with heart or lung disease. Demonstration and skill practice provided.   Stress Management: - Provides group verbal and written instruction about the health risks of elevated stress, cause of high stress, and healthy ways to reduce stress.   Depression: - Provides group verbal and written instruction on the correlation between heart/lung  disease and depressed mood, treatment options, and the stigmas associated with seeking treatment.   Anatomy & Physiology of the Heart: - Group verbal and written instruction and models provide basic cardiac anatomy and physiology, with the coronary electrical and arterial systems. Review of: AMI, Angina, Valve disease, Heart Failure, Cardiac Arrhythmia, Pacemakers, and the ICD.   Cardiac Procedures: - Group verbal and written instruction and models to describe the testing methods done to diagnose heart disease. Reviews the outcomes of the test results. Describes the treatment choices: Medical Management, Angioplasty, or Coronary Bypass Surgery.   Cardiac Medications: - Group verbal and written instruction to review commonly prescribed medications for heart disease. Reviews the medication, class of the drug, and side effects. Includes the steps to properly store meds and maintain the prescription regimen.   Go Sex-Intimacy & Heart Disease, Get SMART - Goal Setting: - Group verbal and written instruction through game format to discuss heart disease and the  return to sexual intimacy. Provides group verbal and written material to discuss and apply goal setting through the application of the S.M.A.R.T. Method.   Other Matters of the Heart: - Provides group verbal, written materials and models to describe Heart Failure, Angina, Valve Disease, and Diabetes in the realm of heart disease. Includes description of the disease process and treatment options available to the cardiac patient.   Exercise & Equipment Safety: - Individual verbal instruction and demonstration of equipment use and safety with use of the equipment. Flowsheet Row Cardiac Rehab from 02/06/2016 in [facility] Cardiac and Pulmonary Rehab  Date  02/06/16  Educator  SB  Instruction Review Code  2- meets goals/outcomes      Infection Prevention: - Provides verbal and written material to individual with discussion of infection control  including proper hand washing and proper equipment cleaning during exercise session. Flowsheet Row Cardiac Rehab from 02/06/2016 in [facility] Cardiac and Pulmonary Rehab  Date  02/06/16  Educator  SB  Instruction Review Code  2- meets goals/outcomes      Falls Prevention: - Provides verbal and written material to individual with discussion of falls prevention and safety. Flowsheet Row Cardiac Rehab from 02/06/2016 in [facility] Cardiac and Pulmonary Rehab  Date  02/06/16  Educator  SB  Instruction Review Code  2- meets goals/outcomes      Diabetes: - Individual verbal and written instruction to review signs/symptoms of diabetes, desired ranges of glucose level fasting, after meals and with exercise. Advice that pre and post exercise glucose checks will be done for 3 sessions at entry of program.    Knowledge Questionnaire Score:     Knowledge Questionnaire Score - 02/06/16 1359      Knowledge Questionnaire Score   Pre Score 26/28 Correct responses reviewed with statement of understanding of response.      Core Components/Risk Factors/Patient Goals at Admission:     Personal Goals and Risk Factors at Admission - 02/06/16 1350      Core Components/Risk Factors/Patient Goals on Admission    Weight Management Obesity;Weight Loss;Yes   Intervention Weight Management: Develop a combined nutrition and exercise program designed to reach desired caloric intake, while maintaining appropriate intake of nutrient and fiber, sodium and fats, and appropriate energy expenditure required for the weight goal.;Weight Management/Obesity: Establish reasonable short term and long term weight goals.;Obesity: Provide education and appropriate resources to help participant work on and attain dietary goals.   Admit Weight 242 lb 8 oz (110 kg)   Goal Weight: Short Term 238 lb (108 kg)   Goal Weight: Long Term 220 lb (99.8 kg)   Expected Outcomes Short Term: Continue to assess and modify interventions until short  term weight is achieved;Long Term: Adherence to nutrition and physical activity/exercise program aimed toward attainment of established weight goal;Weight Loss: Understanding of general recommendations for a balanced deficit meal plan, which promotes 1-2 lb weight loss per week and includes a negative energy balance of [phone] kcal/d   Sedentary Yes   Intervention Provide advice, education, support and counseling about physical activity/exercise needs.;Develop an individualized exercise prescription for aerobic and resistive training based on initial evaluation findings, risk stratification, comorbidities and participant's personal goals.   Expected Outcomes Achievement of increased cardiorespiratory fitness and enhanced flexibility, muscular endurance and strength shown through measurements of functional capacity and personal statement of participant.   Increase Strength and Stamina Yes   Intervention Provide advice, education, support and counseling about physical activity/exercise needs.;Develop an individualized exercise prescription for aerobic and  resistive training based on initial evaluation findings, risk stratification, comorbidities and participant's personal goals.   Expected Outcomes Achievement of increased cardiorespiratory fitness and enhanced flexibility, muscular endurance and strength shown through measurements of functional capacity and personal statement of participant.   Hypertension Yes   Intervention Provide education on lifestyle modifcations including regular physical activity/exercise, weight management, moderate sodium restriction and increased consumption of fresh fruit, vegetables, and low fat dairy, alcohol moderation, and smoking cessation.;Monitor prescription use compliance.   Expected Outcomes Short Term: Continued assessment and intervention until BP is < 140/47mm HG in hypertensive participants. < 130/25mm HG in hypertensive participants with diabetes, heart failure or  chronic kidney disease.;Long Term: Maintenance of blood pressure at goal levels.   Lipids Yes   Intervention Provide education and support for participant on nutrition & aerobic/resistive exercise along with prescribed medications to achieve LDL 70mg , HDL >40mg .   Expected Outcomes Short Term: Participant states understanding of desired cholesterol values and is compliant with medications prescribed. Participant is following exercise prescription and nutrition guidelines.;Long Term: Cholesterol controlled with medications as prescribed, with individualized exercise RX and with personalized nutrition plan. Value goals: LDL < 70mg , HDL > 40 mg.      Core Components/Risk Factors/Patient Goals Review:    Core Components/Risk Factors/Patient Goals at Discharge (Final Review):    ITP Comments:     ITP Comments    Row Name 02/06/16 1346 02/08/16 0607         ITP Comments Medical review completed. Initial ITP created. Documentation of diagnosis can be found in [facility] Visit 01/12/2016 30 day review. Continue with ITP unless directed changes per Medical Director review.  New to program         Comments:

## 2016-02-10 ENCOUNTER — Encounter: Payer: Managed Care, Other (non HMO) | Attending: Cardiology | Admitting: *Deleted

## 2016-02-10 DIAGNOSIS — Z955 Presence of coronary angioplasty implant and graft: Secondary | ICD-10-CM | POA: Insufficient documentation

## 2016-02-10 DIAGNOSIS — I213 ST elevation (STEMI) myocardial infarction of unspecified site: Secondary | ICD-10-CM | POA: Insufficient documentation

## 2016-02-10 NOTE — Progress Notes (Signed)
Daily Session Note  Patient Details  Name: Shawn Elliott MRN: [id number] Date of Birth: Mar 04, 1955 Referring Provider:   Flowsheet Row Cardiac Rehab from 02/06/2016 in [facility] Cardiac and Pulmonary Rehab  Referring Provider  Wende Bushy MD      Encounter Date: 02/10/2016  Check In:     Session Check In - 02/10/16 0856      Check-In   Location [facility]-Cardiac & Pulmonary Rehab   Staff Present Gerlene Burdock, RN, Levie Heritage, MA, ACSM RCEP, Exercise Physiologist;Susanne Bice, RN, BSN, CCRP   Supervising physician immediately available to respond to emergencies See telemetry face sheet for immediately available ER MD   Medication changes reported     No   Fall or balance concerns reported    No   Warm-up and Cool-down Performed on first and last piece of equipment   Resistance Training Performed Yes   VAD Patient? No         Goals Met:  Proper associated with RPD/PD & O2 Sat Exercise tolerated well  Goals Unmet:  Not Applicable  Comments:     Dr. Emily Filbert is Medical Director for [place] and [facility].

## 2016-02-15 ENCOUNTER — Telehealth: Payer: Self-pay | Admitting: Cardiology

## 2016-02-15 ENCOUNTER — Encounter: Payer: Managed Care, Other (non HMO) | Admitting: *Deleted

## 2016-02-15 DIAGNOSIS — I213 ST elevation (STEMI) myocardial infarction of unspecified site: Secondary | ICD-10-CM | POA: Diagnosis not present

## 2016-02-15 DIAGNOSIS — Z955 Presence of coronary angioplasty implant and graft: Secondary | ICD-10-CM

## 2016-02-15 NOTE — Progress Notes (Signed)
Daily Session Note  Patient Details  Name: UNNAMED ZEIEN MRN: [id number] Date of Birth: 09/19/1955 Referring Provider:   Flowsheet Row Cardiac Rehab from 02/06/2016 in [facility] Cardiac and Pulmonary Rehab  Referring Provider  Wende Bushy MD      Encounter Date: 02/15/2016  Check In:     Session Check In - 02/15/16 0845      Check-In   Location [facility]-Cardiac & Pulmonary Rehab   Staff Present Alberteen Sam, MA, ACSM RCEP, Exercise Physiologist;Carroll Enterkin, RN, Vickki Hearing, BA, ACSM CEP, Exercise Physiologist   Supervising physician immediately available to respond to emergencies See telemetry face sheet for immediately available ER MD   Medication changes reported     No   Fall or balance concerns reported    No   Warm-up and Cool-down Performed on first and last piece of equipment   Resistance Training Performed No  held off on arm exercise today   VAD Patient? No     Pain Assessment   Currently in Pain? Yes   Pain Score 3    Pain Location Chest   Pain Orientation Left;Anterior   Pain Descriptors / Indicators Aching;Discomfort;Sore;Tender   Pain Type Acute pain   Pain Onset Yesterday   Pain Frequency Constant   Aggravating Factors  may have pulled something at work   Pain Relieving Factors RN encouraged ibuprofen   Effect of Pain on Daily Activities reduced arm movement today   Multiple Pain Sites No         Goals Met:  Independence with exercise equipment Exercise tolerated well No report of cardiac concerns or symptoms Strength training completed today  Goals Unmet:  See Note  Comments: Pt able to follow exercise prescription today without complaint.  Will continue to monitor for progression.  Ed presented this morning complaining of chest soreness.  He said that it had started yesterday evening and he really felt it this morning.  He thought it was musculoskeletal but was not sure given his stent history.  He was about a 3/10 on the pain scale with  it.  It hurt more when he moved.  He was given a SL NTG with no relief.  Staff felt it was best to have him talk with cardiology.  He was walked down to office and met with Nurse.  They agreed that it was musculoskeletal and encourged him to take ibuprofen.  They called rehab to release to exercise.  He was able to exercise without complaint but did not do any arms today.   Dr. Emily Filbert is Medical Director for [facility] and [facility].

## 2016-02-15 NOTE — Telephone Encounter (Signed)
Spoke with patient and he states that he is pretty sure this is just musculoskeletal in nature because when he is not doing anything he has no pain and that when he pushes on his chest he feels the discomfort more. Let him know to contact us if this problem continues and to try tylenol and other otc pain relief medications. He verbalized understanding and had no further questions at this time.

## 2016-02-15 NOTE — Telephone Encounter (Signed)
Patient here in the office and was sent over to [place] from Cardiac rehab reporting chest pain. He states that this pain started yesterday afternoon and he feels that he did it at work. He showed me a bruise on his right hand and states that he does some very heavy stuff at work. He states that when he presses on his chest it causes more discomfort and if he stretches his arms back then it causes pain to increase as well. He strongly feels this is musculoskeletal pain and reports that they had him take a nitro while over at rehab. Instructed him to try some ibuprofen for the pain and if it persists that he may want to contact his primary care provider for evaluation of muscular pain. Also instructed him to monitor for any other symptoms such as shortness of breath and to call us. He verbalized understanding of our conversation and had no further questions at this time.

## 2016-02-15 NOTE — Telephone Encounter (Signed)
Pt c/o of Chest Pain: STAT if CP now or developed within 24 hours  1. Are you having CP right now? YES  2. Are you experiencing any other symptoms (ex. SOB, nausea, vomiting, sweating)? NO   3. How long have you been experiencing CP? LAST NIGHT MIDDLE CHEST   4. Is your CP continuous or coming and going? CONTINUOUS   5. Have you taken Nitroglycerin? YES NOT RELIEVED  ?  rn PAM WITH PATIENT

## 2016-02-15 NOTE — Telephone Encounter (Signed)
If musculoskeletal, prefer Tylenol. If no improvement with rest of affected muscle or otc pain relief medicine, pt to letus know right away.

## 2016-02-17 ENCOUNTER — Encounter: Payer: Managed Care, Other (non HMO) | Admitting: *Deleted

## 2016-02-17 DIAGNOSIS — Z955 Presence of coronary angioplasty implant and graft: Secondary | ICD-10-CM

## 2016-02-17 DIAGNOSIS — I213 ST elevation (STEMI) myocardial infarction of unspecified site: Secondary | ICD-10-CM

## 2016-02-17 NOTE — Progress Notes (Signed)
Daily Session Note  Patient Details  Name: Shawn Elliott MRN: [id number] Date of Birth: 1955/10/05 Referring Provider:   Flowsheet Row Cardiac Rehab from 02/06/2016 in [facility] Cardiac and Pulmonary Rehab  Referring Provider  Wende Bushy MD      Encounter Date: 02/17/2016  Check In:     Session Check In - 02/17/16 0926      Check-In   Location [facility]-Cardiac & Pulmonary Rehab   Staff Present Heath Lark, RN, BSN, CCRP;Carroll Enterkin, RN, Levie Heritage, MA, ACSM RCEP, Exercise Physiologist   Supervising physician immediately available to respond to emergencies See telemetry face sheet for immediately available ER MD   Medication changes reported     No   Fall or balance concerns reported    No   Warm-up and Cool-down Performed on first and last piece of equipment   Resistance Training Performed Yes   VAD Patient? No     Pain Assessment   Currently in Pain? No/denies         Goals Met:  Exercise tolerated well Personal goals reviewed No report of cardiac concerns or symptoms Strength training completed today  Goals Unmet:  Not Applicable  Comments: Doing well with exercise prescription progression.    Dr. Emily Filbert is Medical Director for [place] and [facility].

## 2016-02-20 ENCOUNTER — Encounter: Payer: Managed Care, Other (non HMO) | Admitting: *Deleted

## 2016-02-20 DIAGNOSIS — Z955 Presence of coronary angioplasty implant and graft: Secondary | ICD-10-CM

## 2016-02-20 DIAGNOSIS — I213 ST elevation (STEMI) myocardial infarction of unspecified site: Secondary | ICD-10-CM | POA: Diagnosis not present

## 2016-02-20 NOTE — Progress Notes (Signed)
Daily Session Note  Patient Details  Name: Shawn Elliott MRN: [id number] Date of Birth: 10/26/1955 Referring Provider:   Flowsheet Row Cardiac Rehab from 02/06/2016 in [facility] Cardiac and Pulmonary Rehab  Referring Provider  Wende Bushy MD      Encounter Date: 02/20/2016  Check In:     Session Check In - 02/20/16 0801      Check-In   Staff Present Nyoka Cowden, RN, BSN, MA;Susanne Bice, RN, BSN, CCRP;Carroll Enterkin, RN, BSN   Supervising physician immediately available to respond to emergencies See telemetry face sheet for immediately available ER MD   Medication changes reported     No   Fall or balance concerns reported    No   Warm-up and Cool-down Performed on first and last piece of equipment   Resistance Training Performed Yes   VAD Patient? No     Pain Assessment   Currently in Pain? No/denies         Goals Met:  Exercise tolerated well No report of cardiac concerns or symptoms Strength training completed today  Goals Unmet:  Not Applicable  Comments: Doing well with exercise prescription progression.    Dr. Emily Filbert is Medical Director for [place] and [facility].

## 2016-02-22 ENCOUNTER — Encounter: Payer: Managed Care, Other (non HMO) | Admitting: *Deleted

## 2016-02-22 DIAGNOSIS — I213 ST elevation (STEMI) myocardial infarction of unspecified site: Secondary | ICD-10-CM | POA: Diagnosis not present

## 2016-02-22 DIAGNOSIS — Z955 Presence of coronary angioplasty implant and graft: Secondary | ICD-10-CM

## 2016-02-22 NOTE — Progress Notes (Signed)
Daily Session Note  Patient Details  Name: Shawn Elliott MRN: [id number] Date of Birth: 1955/04/23 Referring Provider:   Flowsheet Row Cardiac Rehab from 02/06/2016 in [facility] Cardiac and Pulmonary Rehab  Referring Provider  Wende Bushy MD      Encounter Date: 02/22/2016  Check In:     Session Check In - 02/22/16 0920      Check-In   Location [facility]-Cardiac & Pulmonary Rehab   Staff Present Alberteen Sam, MA, ACSM RCEP, Exercise Physiologist;Susanne Bice, RN, BSN, Lance Sell, BA, ACSM CEP, Exercise Physiologist   Supervising physician immediately available to respond to emergencies See telemetry face sheet for immediately available ER MD   Medication changes reported     No   Fall or balance concerns reported    No   Warm-up and Cool-down Performed on first and last piece of equipment   Resistance Training Performed Yes   VAD Patient? No     Pain Assessment   Currently in Pain? No/denies   Multiple Pain Sites No         Goals Met:  Independence with exercise equipment Exercise tolerated well No report of cardiac concerns or symptoms Strength training completed today  Goals Unmet:  Not Applicable  Comments: Pt able to follow exercise prescription today without complaint.  Will continue to monitor for progression. Reviewed home exercise with pt today.  Pt plans to walk at home for exercise.  Reviewed THR, pulse, RPE, sign and symptoms, NTG use, and when to call 911 or MD.  Also discussed weather considerations and indoor options.  Pt voiced understanding.    Dr. Emily Filbert is Medical Director for [place] and [facility].

## 2016-02-27 ENCOUNTER — Encounter: Payer: Managed Care, Other (non HMO) | Admitting: *Deleted

## 2016-02-27 DIAGNOSIS — I213 ST elevation (STEMI) myocardial infarction of unspecified site: Secondary | ICD-10-CM | POA: Diagnosis not present

## 2016-02-27 DIAGNOSIS — Z955 Presence of coronary angioplasty implant and graft: Secondary | ICD-10-CM

## 2016-02-27 NOTE — Progress Notes (Signed)
Daily Session Note  Patient Details  Name: [name] MRN: [id number] Date of Birth: 03-03-1955 Referring Provider:   Flowsheet Row Cardiac Rehab from 02/06/2016 in [facility] Cardiac and Pulmonary Rehab  Referring Provider  Wende Bushy MD      Encounter Date: 02/27/2016  Check In:     Session Check In - 02/27/16 0748      Check-In   Location [facility]-Cardiac & Pulmonary Rehab   Staff Present Alberteen Sam, MA, ACSM RCEP, Exercise Physiologist;Lajuana Patchell Amedeo Plenty, BS, ACSM CEP, Exercise Physiologist;Carroll Enterkin, RN, BSN   Supervising physician immediately available to respond to emergencies See telemetry face sheet for immediately available ER MD   Medication changes reported     No   Fall or balance concerns reported    No   Warm-up and Cool-down Performed on first and last piece of equipment   Resistance Training Performed Yes   VAD Patient? No     Pain Assessment   Currently in Pain? No/denies   Multiple Pain Sites No         Goals Met:  Independence with exercise equipment Exercise tolerated well No report of cardiac concerns or symptoms Strength training completed today  Goals Unmet:  Not Applicable  Comments: Pt able to follow exercise prescription today without complaint.  Will continue to monitor for progression.    Dr. Emily Filbert is Medical Director for [place] and [facility].

## 2016-02-28 ENCOUNTER — Ambulatory Visit (INDEPENDENT_AMBULATORY_CARE_PROVIDER_SITE_OTHER): Payer: Managed Care, Other (non HMO) | Admitting: Cardiology

## 2016-02-28 ENCOUNTER — Encounter: Payer: Self-pay | Admitting: Cardiology

## 2016-02-28 VITALS — BP 132/90 | HR 57 | Ht 74.0 in | Wt 238.5 lb

## 2016-02-28 DIAGNOSIS — I255 Ischemic cardiomyopathy: Secondary | ICD-10-CM

## 2016-02-28 DIAGNOSIS — I2510 Atherosclerotic heart disease of native coronary artery without angina pectoris: Secondary | ICD-10-CM | POA: Diagnosis not present

## 2016-02-28 DIAGNOSIS — I25110 Atherosclerotic heart disease of native coronary artery with unstable angina pectoris: Secondary | ICD-10-CM

## 2016-02-28 DIAGNOSIS — Z955 Presence of coronary angioplasty implant and graft: Secondary | ICD-10-CM

## 2016-02-28 DIAGNOSIS — E785 Hyperlipidemia, unspecified: Secondary | ICD-10-CM

## 2016-02-28 NOTE — Progress Notes (Signed)
[place]

## 2016-02-28 NOTE — Progress Notes (Signed)
Cardiology Office Note   Date:  02/28/2016   ID:  Shawn Elliott, DOB April 30, 1955, MRN [id number]  Referring Doctor:  Stoney Bang, MD   Cardiologist:   Wende Bushy, MD   Reason for consultation:  Chief Complaint  Patient presents with  . other    56mof/u. Pt states he is doing well. Reviewed meds with pt verbally.      History of Present Illness: Shawn LARDis a 61y.o. male who presents forFollow-up for CAD status post MI  Review [facility] medical [facility]. EKG showed inferior lateral STEMI and he was taken to the Cath Lab urgently. 01/12/2016 cath showed total occlusion of large dominant RCA, this was status post PCI with 3 drug-eluting stents to mid and distal RCA. The residual disease in mid LAD and proximal left circumflex bifurcation stenosis and ostium of OM1 vessel were intervened on an 01/17/2016. He had PTCA/DES to the mid LAD with brisk TIMI -3 flow achieved. Also had successful intervention with angiosculpt and DES of tandem proximal and mid LCx stenosis. He developed atrial fibrillation in relation to the cardiac catheterization. He was placed on amiodarone briefly but this was discontinued as it was thought the A. fib was related to the procedure. He had had no recurrence since then.  Since last visit, patient has been doing well. Dyspnea has improved. He is participating in cardiac rehabilitation and tolerating it well. He has developed chest pain and some back pain which are musculoskeletal as they occur with movement of his upper body. No angina. No shortness of breath with exertion that is out of proportion to the level of exercise that he is doing.  Patient denies PND, orthopnea, edema. No palpitations, loss of consciousness.   ROS:  Please see the history of present illness. Aside from mentioned under HPI, all other systems are reviewed and negative.     Past Medical History:    Diagnosis Date  . Coronary artery disease   . GERD (gastroesophageal reflux disease)   . High cholesterol   . History of kidney stones    "passed them" (01/17/2016)  . Hypertension   . PONV (postoperative nausea and vomiting)    "when I woke up from my hernia I was nauseated"  . STEMI (ST elevation myocardial infarction) [place]    Past Surgical History:  Procedure Laterality Date  . CARDIAC CATHETERIZATION N/A 01/12/2016   Procedure: Left Heart Cath and Coronary Angiography;  Surgeon: TTroy Sine MD;  [facility];  Service: Cardiovascular;  Laterality: N/A;  . CARDIAC CATHETERIZATION N/A 01/12/2016   Procedure: Coronary Stent Intervention;  Surgeon: TTroy Sine MD;  [facility];  Service: Cardiovascular;  Laterality: N/A;  . CARDIAC CATHETERIZATION N/A 01/17/2016   Procedure: Coronary Stent Intervention;  Surgeon: TTroy Sine MD;  [facility];  Service: Cardiovascular;  Laterality: N/A;  . CARDIAC CATHETERIZATION N/A 01/17/2016   Procedure: Coronary Balloon Angioplasty;  Surgeon: TTroy Sine MD;  [facility];  Service: Cardiovascular;  Laterality: N/A;  . COLONOSCOPY  ~ 2008  . CORONARY ANGIOPLASTY WITH STENT PLACEMENT  01/17/2016   "3 01/12/2016; 3 01/17/2016"  . GANGLION CYST EXCISION Right ~ 2016  . INGUINAL HERNIA REPAIR Right ~ 2014  . kidney stones  2010     reports that he quit smoking about 36 years ago. His  smoking use included Pipe. He has never used smokeless tobacco. He reports that he drinks about 0.6 oz of alcohol per week . He reports that he does not use drugs.   family history includes Diabetes in his brother, father, and mother; Heart attack in his mother; Hyperlipidemia in his brother, father, mother, and sister; Hypertension in his brother, father, mother, and sister; Stroke in his father and mother.   Outpatient Medications Prior to Visit  Medication Sig Dispense Refill  .  aspirin EC 81 MG tablet Take 81 mg by mouth daily.    Marland Kitchen atorvastatin (LIPITOR) 80 MG tablet Take 1 tablet (80 mg total) by mouth daily at 6 PM. 90 tablet 3  . isosorbide mononitrate (IMDUR) 30 MG 24 hr tablet Take 1 tablet (30 mg total) by mouth daily. 90 tablet 3  . lisinopril (PRINIVIL,ZESTRIL) 2.5 MG tablet Take 1 tablet (2.5 mg total) by mouth daily. 90 tablet 3  . metoprolol tartrate (LOPRESSOR) 25 MG tablet Take 0.5 tablets (12.5 mg total) by mouth 2 (two) times daily. 90 tablet 3  . nitroGLYCERIN (NITROSTAT) 0.4 MG SL tablet Place 1 tablet (0.4 mg total) under the tongue every 5 (five) minutes as needed for chest pain. 25 tablet 2  . omeprazole (PRILOSEC) 20 MG capsule Take 20 mg by mouth daily.    . ticagrelor (BRILINTA) 90 MG TABS tablet Take 1 tablet (90 mg total) by mouth 2 (two) times daily. 180 tablet 3  . tizanidine (ZANAFLEX) 2 MG capsule Take 2 mg by mouth 3 (three) times daily as needed for muscle spasms.    . furosemide (LASIX) 20 MG tablet Take 1 tablet (20 mg total) by mouth daily. (Patient not taking: Reported on 02/28/2016) 30 tablet 1  . potassium chloride (K-DUR) 10 MEQ tablet Take 1 tablet (10 mEq total) by mouth daily. (Patient not taking: Reported on 02/28/2016) 30 tablet 2   No facility-administered medications prior to visit.      Allergies: Penicillins    PHYSICAL EXAM: VS:  BP 132/90 (BP Location: Left Arm, Patient Position: Sitting, Cuff Size: Normal)   Pulse (!) 57   Ht '6\' 2"'$  (1.88 m)   Wt 238 lb 8 oz (108.2 kg)   BMI 30.62 kg/m  , Body mass index is 30.62 kg/m. Wt Readings from Last 3 Encounters:  02/28/16 238 lb 8 oz (108.2 kg)  02/06/16 242 lb 8 oz (110 kg)  01/24/16 244 lb 12 oz (111 kg)  GENERAL:  well developed, well nourished, obese, not in acute distress HEENT: normocephalic, pink conjunctivae, anicteric sclerae, no xanthelasma, normal dentition, oropharynx clear NECK:  no neck vein engorgement, JVP normal, no hepatojugular reflux, carotid  upstroke brisk and symmetric, no bruit, no thyromegaly, no lymphadenopathy LUNGS:  good respiratory effort, clear to auscultation bilaterally CV:  PMI not displaced, no thrills, no lifts, S1 and S2 within normal limits, no palpable S3 or S4, no murmurs, no rubs, no gallops ABD:  Soft, nontender, nondistended, normoactive bowel sounds, no abdominal aortic bruit, no hepatomegaly, no splenomegaly MS: nontender back, no kyphosis, no scoliosis, no joint deformities EXT:  2+ DP/PT pulses, no edema, no varicosities, no cyanosis, no clubbing SKIN: warm, nondiaphoretic, normal turgor, no ulcers NEUROPSYCH: alert, oriented to person, place, and time, sensory/motor grossly intact, normal mood, appropriate affect    Recent Labs: 01/12/2016: Magnesium 1.8; TSH 0.416 01/14/2016: B Natriuretic Peptide 418.6 01/18/2016: Hemoglobin 12.4 01/24/2016: Platelets 365 02/01/2016: ALT 23; BUN 18; Creatinine, Ser 1.17; Potassium 4.9; Sodium 143  Lipid Panel    Component Value Date/Time   CHOL 266 (H) 01/12/2016 0535   TRIG 183 (H) 01/12/2016 0535   HDL 47 01/12/2016 0535   CHOLHDL 5.7 01/12/2016 0535   VLDL 37 01/12/2016 0535   LDLCALC 182 (H) 01/12/2016 0535     Other studies Reviewed:  EKG:  The ekg from 01/24/2016 was personally reviewed by me and it revealed sinus rhythm, 65 bpm. LAD. Q waves in inferior leads likely previous infarct.  Additional studies/ records that were reviewed personally reviewed by me today include:   Coronary Stent Intervention  Left Heart Cath and Coronary Angiography 01/12/16   There is moderate left ventricular systolic dysfunction.  LV end diastolic pressure is mildly elevated.  Prox Cx to Mid Cx lesion, 75 %stenosed.  Ost 1st Mrg to 1st Mrg lesion, 70 %stenosed.  Mid LAD lesion, 80 %stenosed.  A drug eluting stent was successfully placed, and overlaps previously placed stent.  Mid RCA to Dist RCA lesion, 100 %stenosed.  Post intervention, there is a 0% residual  stenosis.  A STENT RESOLUTE ONYX 3.5X15 drug eluting stent was successfully placed.  Prox RCA lesion, 95 %stenosed.  Post intervention, there is a 0% residual stenosis.  Acute inferior lateral ST segment elevation myocardial infarction secondary to total occlusion of a large dominant RCA.  Multivessel coronary obstructive disease with 80% mid LAD stenosis before the takeoff of the second diagonal vessel; proximal left circumflex bifurcation stenosis with 75% stenosis in the proximal circumflex and 70% at the ostium of the OM1 vessel; 95% very proximal RCA stenosis with total occlusion of the mid RCA with significant thrombus and TIMI 0 flow. A mild collaterals to the PLA branch of the dominant RCA via the LAD.  Successful percutaneous coronary intervention to the large dominant RCA with PTCA and tandem stenting of the distal RCA with insertion of a 3.022 mm and 3.518 mm Resolute Onyx stent in the region of the acute margin extending to just proximal to the takeoff of the PDA with post stent dilatation with stent taper from 4.0 to 3.4 mm with the 100% occlusion being reduced to 0%; and PTCA/DES stenting of the proximal 95% stenosis with insertion of a 3.515 mm Resolute Onyx DES stent postdilated to 4.0 mm with the 95% stenosis being reduced to 0%. Initially there was significant thrombus burden which resolved. The patient developed atrial fibrillation during the procedure following reperfusion and received metoprolol IV.  Moderate acute LV dysfunction with contractility involving the entire inferior wall extending to and involving the apex; EF 40%.  Coronary Balloon Angioplasty  Coronary Stent Intervention 01/17/16   Prox RCA lesion, 0 %stenosed.  Mid RCA to Dist RCA lesion, 0 %stenosed.  Post Atrio-1 lesion, 30 %stenosed.  Post Atrio-2 lesion, 40 %stenosed.  Ramus lesion, 55 %stenosed.  Ost Cx lesion, 40 %stenosed.  Dist Cx lesion, 70 %stenosed.  Mid LAD-1 lesion, 45  %stenosed.  A STENT RESOLUTE ONYX 2.5X30 drug eluting stent was successfully placed.  Prox Cx to Mid Cx lesion, 80 %stenosed.  Post intervention, there is a 0% residual stenosis.  Ost 1st Mrg to 1st Mrg lesion, 75 %stenosed.  Post intervention, there is a 35% residual stenosis.  A stent was successfully placed.  A STENT RESOLUTE ONYX 2.5X15 drug eluting stent was successfully placed.  Mid LAD-2 lesion, 80 %stenosed.  Post intervention, there is a 0% residual stenosis.  Widely patent stents in the proximal, and mid distal RCA in a very large dominant RCA vessel  with mild 30 and 40% distal continuation branch stenoses.  Successful PTCA/DES stenting of the mid LAD 80% stenosis with insertion of a 2.515 mm Resolute Onyx DES stent postdilated to 2.64 mm with the 80% stenosis being reduced to 0%. There was evidence for a small linear dissection in the region of the septal perforating artery which was stented with a tandem 2.58 mm Resolute Onyx DES stent postdilated to 2.64 mm. The entire stented segment was reduced to 0%. There was brisk TIMI-3 flow. There was no change in the 40-50% narrowing immediately after the takeoff of the first diagonal vessel, which was not intervened upon.  Successful intervention with angiosculpt going balloon dilatation of the left circumflex marginal 1 vessel with the 75% stenosis being reduced to 35% and Angiosculpt and DES stenting of tandem proximal and mid circumflex stenoses with a 2.5 30 mm Resolute Onyx stent postdilated to 2.65 mm with the stenoses being reduced to 0%.  Echo 01/14/2016: Left ventricle: The cavity size was mildly dilated. There was   moderate concentric hypertrophy. Systolic function was mildly to   moderately reduced. The estimated ejection fraction was in the   range of 40% to 45%. Severe hypokinesis of the inferolateral and   inferior myocardium. Features are consistent with a pseudonormal   left ventricular filling  pattern, with concomitant abnormal   relaxation and increased filling pressure (grade 2 diastolic   dysfunction). No evidence of thrombus. - Aortic valve: Valve area (VTI): 4.34 cm^2. Valve area (Vmax):   4.65 cm^2. Valve area (Vmean): 4.23 cm^2. - Left atrium: The atrium was mildly dilated. - Right atrium: The atrium was mildly dilated. - Atrial septum: No defect or patent foramen ovale was identified. - Pericardium, extracardiac: A trivial pericardial effusion was   identified.   ASSESSMENT AND PLAN: Coronary artery disease, multivessel History of ST elevation MI due to occlusion of RCA status post PCI Resolute Onyx DES 01/12/2016 Status post resolute Onyx DES to mid LAD, Cx Patient denies angina and dyspne Continue dual antiplatelet therapy for at least 1 year, aspirin after that lifelong 81 mg daily. Next Continue medical therapy with metoprolol, lisinopril, Imdur, atorvastatin.  He has been off of Lasix.  Continue cardiac rehabilitation. Recommend to repeat fasting lipid panel and CMP sometime in April. Continue lifestyle changes.  Atrial fibrillation, paroxysmal, related to heart catheterization procedure Sinus rhythm on today's EKG. No recurrence of palpitations. Likely only related to the procedure.   Cardiomyopathy likely ischemic, mild systolic, diastolic, chronic EF of 32-35% from echo 01/14/2016 Severe hypokinesis of inferior lateral and inferior myocardium No evidence of CHF. Off of Lasix. Continue medical therapy. Next and continue sodium restriction.   Hyperlipidemia LDL goal is less than 70. Repeat labs in April.  Current medicines are reviewed at length with the patient today.  The patient does not have concerns regarding medicines.  Labs/ tests ordered today include:  Orders Placed This Encounter  Procedures  . Comp Met (CMET)  . Lipid panel  . EKG 12-Lead    I had a lengthy and detailed discussion with the patient regarding diagnoses, prognosis,  diagnostic options, treatment options , and side effects of medications.   I counseled the patient on importance of lifestyle modification including heart healthy diet, regular physical activity, and smoking cessation.   Disposition:   FU with undersigned In 6 months  Signed, Wende Bushy, MD  02/28/2016 10:08 AM    [place]  This note was generated in part with voice  recognition software and I apologize for any typographical errors that were not detected and corrected.

## 2016-02-28 NOTE — Patient Instructions (Signed)
Labwork: Your physician recommends that you return for lab work in: [facility] April for fasting lipid profile and CMP. Make sure not to eat or drink after midnight prior to coming in for these labs.    Follow-Up: Your physician wants you to follow-up in: 6 months. You will receive a reminder letter in the mail two months in advance. If you don't receive a letter, please call our office to schedule the follow-up appointment.  It was a pleasure seeing you today here in the office. Please do not hesitate to give [place] a call back if you have any further questions. [phone]  [place] Cellar RN, BSN

## 2016-02-29 ENCOUNTER — Encounter: Payer: Managed Care, Other (non HMO) | Admitting: *Deleted

## 2016-02-29 DIAGNOSIS — I213 ST elevation (STEMI) myocardial infarction of unspecified site: Secondary | ICD-10-CM | POA: Diagnosis not present

## 2016-02-29 DIAGNOSIS — Z955 Presence of coronary angioplasty implant and graft: Secondary | ICD-10-CM

## 2016-02-29 NOTE — Progress Notes (Signed)
Daily Session Note  Patient Details  Name: Shawn Elliott MRN: [id number] Date of Birth: 02/12/1955 Referring Provider:   Flowsheet Row Cardiac Rehab from 02/06/2016 in [facility] Cardiac and Pulmonary Rehab  Referring Provider  Wende Bushy MD      Encounter Date: 02/29/2016  Check In:     Session Check In - 02/29/16 0923      Check-In   Location [facility]-Cardiac & Pulmonary Rehab   Staff Present Alberteen Sam, MA, ACSM RCEP, Exercise Physiologist;Carroll Enterkin, RN, Vickki Hearing, BA, ACSM CEP, Exercise Physiologist   Supervising physician immediately available to respond to emergencies See telemetry face sheet for immediately available ER MD   Medication changes reported     No   Fall or balance concerns reported    No   Warm-up and Cool-down Performed on first and last piece of equipment   Resistance Training Performed Yes   VAD Patient? No     Pain Assessment   Currently in Pain? No/denies   Multiple Pain Sites No           Exercise Prescription Changes - 02/28/16 1400      Exercise Review   Progression Yes     Response to Exercise   Blood Pressure (Admit) 134/64   Blood Pressure (Exercise) 150/82   Blood Pressure (Exit) 120/80   Heart Rate (Admit) 70 bpm   Heart Rate (Exercise) 100 bpm   Heart Rate (Exit) 61 bpm   Rating of Perceived Exertion (Exercise) 13   Symptoms none   Comments Home Exercise Guidelines given 02/22/16   Duration Progress to 45 minutes of aerobic exercise without signs/symptoms of physical distress   Intensity THRR unchanged     Progression   Progression Continue to progress workloads to maintain intensity without signs/symptoms of physical distress.   Average METs 3.42     Resistance Training   Training Prescription Yes   Weight 3 lbs   Reps 10-15     Interval Training   Interval Training No     Treadmill   MPH 2.5   Grade 1   Minutes 15   METs 3.26     REL-XR   Level 6   Minutes 15   METs 3.9     T5 Nustep   Level  6   Minutes 15   METs 3.1     Home Exercise Plan   Plans to continue exercise at Home  walking   Frequency Add 3 additional days to program exercise sessions.      Goals Met:  Independence with exercise equipment Exercise tolerated well No report of cardiac concerns or symptoms Strength training completed today  Goals Unmet:  Not Applicable  Comments: Pt able to follow exercise prescription today without complaint.  Will continue to monitor for progression.    Dr. Emily Filbert is Medical Director for [place] and [facility].

## 2016-03-07 ENCOUNTER — Encounter: Payer: Self-pay | Admitting: *Deleted

## 2016-03-07 DIAGNOSIS — Z955 Presence of coronary angioplasty implant and graft: Secondary | ICD-10-CM

## 2016-03-07 DIAGNOSIS — I213 ST elevation (STEMI) myocardial infarction of unspecified site: Secondary | ICD-10-CM

## 2016-03-07 NOTE — Progress Notes (Signed)
Cardiac Individual Treatment Plan  Patient Details  Name: Shawn Elliott MRN: [id number] Date of Birth: September 29, 1955 Referring Provider:   Flowsheet Row Cardiac Rehab from 02/06/2016 in [facility] Cardiac and Pulmonary Rehab  Referring Provider  Wende Bushy MD      Initial Encounter Date:  Flowsheet Row Cardiac Rehab from 02/06/2016 in [facility] Cardiac and Pulmonary Rehab  Date  02/06/16  Referring Provider  Wende Bushy MD      Visit Diagnosis: ST elevation myocardial infarction (STEMI), unspecified artery [facility])  Status post coronary artery stent placement  Patient's Home Medications on Admission:  Current Outpatient Prescriptions:  .  aspirin EC 81 MG tablet, Take 81 mg by mouth daily., Disp: , Rfl:  .  atorvastatin (LIPITOR) 80 MG tablet, Take 1 tablet (80 mg total) by mouth daily at 6 PM., Disp: 90 tablet, Rfl: 3 .  isosorbide mononitrate (IMDUR) 30 MG 24 hr tablet, Take 1 tablet (30 mg total) by mouth daily., Disp: 90 tablet, Rfl: 3 .  lisinopril (PRINIVIL,ZESTRIL) 2.5 MG tablet, Take 1 tablet (2.5 mg total) by mouth daily., Disp: 90 tablet, Rfl: 3 .  metoprolol tartrate (LOPRESSOR) 25 MG tablet, Take 0.5 tablets (12.5 mg total) by mouth 2 (two) times daily., Disp: 90 tablet, Rfl: 3 .  nitroGLYCERIN (NITROSTAT) 0.4 MG SL tablet, Place 1 tablet (0.4 mg total) under the tongue every 5 (five) minutes as needed for chest pain., Disp: 25 tablet, Rfl: 2 .  omeprazole (PRILOSEC) 20 MG capsule, Take 20 mg by mouth daily., Disp: , Rfl:  .  ticagrelor (BRILINTA) 90 MG TABS tablet, Take 1 tablet (90 mg total) by mouth 2 (two) times daily., Disp: 180 tablet, Rfl: 3 .  tizanidine (ZANAFLEX) 2 MG capsule, Take 2 mg by mouth 3 (three) times daily as needed for muscle spasms., Disp: , Rfl:   Past Medical History: Past Medical History:  Diagnosis Date  . Coronary artery disease   . GERD (gastroesophageal reflux disease)   . High cholesterol   . History of kidney stones    "passed them" (01/17/2016)   . Hypertension   . PONV (postoperative nausea and vomiting)    "when I woke up from my hernia I was nauseated"  . STEMI (ST elevation myocardial infarction) ([place]) 01/12/2016    Tobacco Use: History  Smoking Status  . Former Smoker  . Types: Pipe  . Quit date: 1982  Smokeless Tobacco  . Never Used    Labs: Recent [employer] for ITP Cardiac and Pulmonary Rehab Latest Ref Rng & Units 01/12/2016   Cholestrol 0 - 200 mg/dL 266(H)   LDLCALC 0 - 99 mg/dL 182(H)   HDL >40 mg/dL 47   Trlycerides <150 mg/dL 183(H)   Hemoglobin A1c 4.8 - 5.6 % 5.4   TCO2 0 - 100 mmol/L 21       Exercise Target Goals:    Exercise Program Goal: Individual exercise prescription set with THRR, safety & activity barriers. Participant demonstrates ability to understand and report RPE using BORG scale, to self-measure pulse accurately, and to acknowledge the importance of the exercise prescription.  Exercise Prescription Goal: Starting with aerobic activity 30 plus minutes a day, 3 days per week for initial exercise prescription. Provide home exercise prescription and guidelines that participant acknowledges understanding prior to discharge.  Activity Barriers & Risk Stratification:     Activity Barriers & Cardiac Risk Stratification - 02/06/16 1400      Activity Barriers & Cardiac Risk Stratification  Activity Barriers --  Did state that right shoulder "catches" every now and then.    Cardiac Risk Stratification High      6 Minute Walk:     6 Minute Walk    Row Name 02/06/16 1516         6 Minute Walk   Phase Initial     Distance 1384 feet     Walk Time 6 minutes     # of Rest Breaks 0     MPH 2.62     METS 3.33     RPE 12     Perceived Dyspnea  1     VO2 Peak 11.67     Symptoms Yes (comment)     Comments some SOB     Resting HR 65 bpm     Resting BP 152/74     Max Ex. HR 83 bpm     Max Ex. BP 132/74     2 Minute Post BP 122/64        Oxygen Initial  Assessment:   Oxygen Re-Evaluation:   Oxygen Discharge (Final Oxygen Re-Evaluation):   Initial Exercise Prescription:     Initial Exercise Prescription - 02/06/16 1500      Date of Initial Exercise RX and Referring Provider   Date 02/06/16   Referring Provider Wende Bushy MD     Treadmill   MPH 2.5   Grade 0.5   Minutes 15   METs 3.09     REL-XR   Level 3   Minutes 15   METs 2     T5 Nustep   Level 3   Minutes 15   METs 2     Prescription Details   Frequency (times per week) 3   Duration Progress to 45 minutes of aerobic exercise without signs/symptoms of physical distress     Intensity   THRR 40-80% of Max Heartrate 103-142   Ratings of Perceived Exertion 11-15   Perceived Dyspnea 0-4     Progression   Progression Continue to progress workloads to maintain intensity without signs/symptoms of physical distress.     Resistance Training   Training Prescription Yes   Weight 3 lbs   Reps 10-15      Perform Capillary Blood Glucose checks as needed.  Exercise Prescription Changes:     Exercise Prescription Changes    Row Name 02/06/16 1400 02/13/16 1500 02/22/16 0900 02/28/16 1400       Response to Exercise   Blood Pressure (Admit) 152/74 114/60  - 134/64    Blood Pressure (Exercise) 132/74 122/70  - 150/82    Blood Pressure (Exit) 122/64 114/60  - 120/80    Heart Rate (Admit) 65 bpm 63 bpm  - 70 bpm    Heart Rate (Exercise) 83 bpm 109 bpm  - 100 bpm    Heart Rate (Exit) 66 bpm 69 bpm  - 61 bpm    Oxygen Saturation (Admit) 98 %  -  -  -    Oxygen Saturation (Exercise) 97 %  -  -  -    Rating of Perceived Exertion (Exercise) 12 13  - 13    Perceived Dyspnea (Exercise) 1  -  -  -    Symptoms some SOB none none none    Comments  -  - Home Exercise Guidelines given 02/22/16 Home Exercise Guidelines given 02/22/16    Duration  - Progress to 45 minutes of aerobic exercise without signs/symptoms of physical distress Progress to  45 minutes of aerobic  exercise without signs/symptoms of physical distress Progress to 45 minutes of aerobic exercise without signs/symptoms of physical distress    Intensity  - THRR unchanged THRR unchanged THRR unchanged      Progression   Progression  - Continue to progress workloads to maintain intensity without signs/symptoms of physical distress. Continue to progress workloads to maintain intensity without signs/symptoms of physical distress. Continue to progress workloads to maintain intensity without signs/symptoms of physical distress.    Average METs  - 2.75 2.75 3.42      Resistance Training   Training Prescription  - Yes Yes Yes    Weight  - 3 lbs 3 lbs 3 lbs    Reps  - 10-15 10-15 10-15      Interval Training   Interval Training  - No No No      Treadmill   MPH  - 2.5 2.5 2.5    Grade  - 0.5 0.5 1    Minutes  - _0 METs  - 3.09 3.09 3.26      REL-XR   Level  - _1 Minutes  - _2 METs  - 2.7 2.7 3.9      T5 Nustep   Level  - _3 Minutes  - _4 METs  - 2.3 2.3 3.1      Home Exercise Plan   Plans to continue exercise at  -  - Home  walking Home  walking    Frequency  -  - Add 3 additional days to program exercise sessions. Add 3 additional days to program exercise sessions.      Exercise Review   Progression -  walk test results Yes Yes Yes       Exercise Comments:     Exercise Comments    Row Name 02/08/16 1000 02/13/16 1510 02/22/16 0921 02/28/16 1457     Exercise Comments First full day of exercise!  Patient was oriented to gym and equipment including functions, settings, policies, and procedures.  Patient's individual exercise prescription and treatment plan were reviewed.  All starting workloads were established based on the results of the 6 minute walk test done at initial orientation visit.  The plan for exercise progression was also introduced and progression will be customized based on patient's performance and goals. Ed has done well  for his first two full visits with exercise.  He is already up to 2.7 METs on the XR!  We will continue to monitor for progression. Reviewed home exercise with pt today.  Pt plans to walk at home for exercise.  Reviewed THR, pulse, RPE, sign and symptoms, NTG use, and when to call 911 or MD.  Also discussed weather considerations and indoor options.  Pt voiced understanding. Ed has been doing well in rehab.  He is now up to level 6 on the NuStep!  We will continue to monitor his progression.       Exercise Goals and Review:   Exercise Goals Re-Evaluation :   Discharge Exercise Prescription (Final Exercise Prescription Changes):     Exercise Prescription Changes - 02/28/16 1400      Response to Exercise   Blood Pressure (Admit) 134/64   Blood Pressure (Exercise) 150/82   Blood Pressure (Exit) 120/80   Heart Rate (Admit) 70 bpm   Heart Rate (Exercise) 100 bpm  Heart Rate (Exit) 61 bpm   Rating of Perceived Exertion (Exercise) 13   Symptoms none   Comments Home Exercise Guidelines given 02/22/16   Duration Progress to 45 minutes of aerobic exercise without signs/symptoms of physical distress   Intensity THRR unchanged     Progression   Progression Continue to progress workloads to maintain intensity without signs/symptoms of physical distress.   Average METs 3.42     Resistance Training   Training Prescription Yes   Weight 3 lbs   Reps 10-15     Interval Training   Interval Training No     Treadmill   MPH 2.5   Grade 1   Minutes 15   METs 3.26     REL-XR   Level 6   Minutes 15   METs 3.9     T5 Nustep   Level 6   Minutes 15   METs 3.1     Home Exercise Plan   Plans to continue exercise at Home  walking   Frequency Add 3 additional days to program exercise sessions.     Exercise Review   Progression Yes      Nutrition:  Target Goals: Understanding of nutrition guidelines, daily intake of sodium <1570m, cholesterol <2052m calories 30% from fat and 7% or  less from saturated fats, daily to have 5 or more servings of fruits and vegetables.  Biometrics:     Pre Biometrics - 02/06/16 1519      Pre Biometrics   Height _0  (1.88 m)   Weight 242 lb 8 oz (110 kg)   Waist Circumference 43 inches   Hip Circumference 43 inches   Waist to Hip Ratio 1 %   BMI (Calculated) 31.2   Single Leg Stand 30 seconds       Nutrition Therapy Plan and Nutrition Goals:     Nutrition Therapy & Goals - 02/20/16 1229      Nutrition Therapy   Diet Instructed patient accompanied by his wife on heart healthy dietary guidelines based on 2000 calories.   Drug/Food Interactions Statins/Certain Fruits   Protein (specify units) 8   Fiber 30 grams   Whole Grain Foods 3 servings   Saturated Fats 14 max. grams   Fruits and Vegetables 5 servings/day   Sodium 2000 grams  [id number]     Personal Nutrition Goals   Nutrition Goal Use my fitnesspal to track food and beverage intake.   Personal Goal #2 Read labels for saturated fat, trans fat and sodium.   Personal Goal #3 Fill half of the plate with non-starchy vegetables and fruit   Personal Goal #4 Try fruit and nuts as a snack.     Intervention Plan   Intervention Prescribe, educate and counsel regarding individualized specific dietary modifications aiming towards targeted core components such as weight, hypertension, lipid management, diabetes, heart failure and other comorbidities.;Nutrition handout(s) given to patient.   Expected Outcomes Short Term Goal: Understand basic principles of dietary content, such as calories, fat, sodium, cholesterol and nutrients.;Short Term Goal: A plan has been developed with personal nutrition goals set during dietitian appointment.;Long Term Goal: Adherence to prescribed nutrition plan.      Nutrition Discharge: Rate Your Plate Scores:     Nutrition Assessments - 02/06/16 1349      MEDFICTS Scores   Pre Score 26      Nutrition Goals Re-Evaluation:   Nutrition  Goals Discharge (Final Nutrition Goals Re-Evaluation):   Psychosocial: Target Goals: Acknowledge presence or absence  of significant depression and/or stress, maximize coping skills, provide positive support system. Participant is able to verbalize types and ability to use techniques and skills needed for reducing stress and depression.   Initial Review & Psychosocial Screening:     Initial Psych Review & Screening - 02/06/16 1351      Initial Review   Current issues with --  None reported. 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 Yes  Family and Friends     Barriers   Psychosocial barriers to participate in program There are no identifiable barriers or psychosocial needs.;The patient should benefit from training in stress management and relaxation.     Screening Interventions   Interventions Encouraged to exercise      Quality of Life Scores:      Quality of Life - 02/06/16 1357      Quality of Life Scores   Health/Function Pre 27.17 %   Socioeconomic Pre 28.27 %   Psych/Spiritual Pre 28.29 %   Family Pre 30 %   GLOBAL Pre 28.03 %      PHQ-9: Recent Review Flowsheet Data    Depression screen [facility] 2/9 02/06/2016   Decreased Interest 0   Down, Depressed, Hopeless 0   PHQ - 2 Score 0   Altered sleeping 0   Tired, decreased energy 0   Change in appetite 0   Feeling bad or failure about yourself  0   Trouble concentrating 0   Moving slowly or fidgety/restless 0   Suicidal thoughts 0   PHQ-9 Score 0   Difficult doing work/chores Not difficult at all     Interpretation of Total Score  Total Score Depression Severity:  1-4 = Minimal depression, 5-9 = Mild depression, 10-14 = Moderate depression, 15-19 = Moderately severe depression, 20-27 = Severe depression   Psychosocial Evaluation and Intervention:     Psychosocial  Evaluation - 02/08/16 0945      Psychosocial Evaluation & Interventions   Interventions Encouraged to exercise with the program and follow exercise prescription;Stress management education;Relaxation education   Comments Counselor met with Mr. Restivo (Ed) today for initial psychosocial evaluation.  He is a 60 (almost 36) year old gentleman who had a heart attack and stents earlier this month.  He has a large support system with family, friends and church involvement.  He has a spouse of 26 years.  Ed states he is in good health other than his heart and that he sleeps better now since the surgery; and has a healthy appetite.  He denies a history of depression or anxiety or any current symptoms.  It is obvious Ed was a bit traumatized by his heart attack and the subsequent procedures as he shared his story in a very detailed manner.  It reports it feels better to talk about it.  Ed states he is typically a positive person with minimal stress in his life.  He has some current side effects from new medications that cause shortness of breath and this creates some stress for him, but he feels sure he will adjust.  Ed has goals to lose weight; learn to exercise more consistently and and learn about healthier nutrition and diet.  Staff will be following with Mr. Pozzi throughout the course of this program.        Psychosocial Re-Evaluation:     Psychosocial  Re-Evaluation    Row Name 02/17/16 0930             Psychosocial Re-Evaluation   Comments Ed states that he is doing well without stress and depression. He is to meet with the RD next week  and he hopes to have more specific information for his nutriition goals. He is participating in class ansd is asking questions concerning how to care for himself.  Goal is continue making healthy lifestyle changes without signs of stress or depression.       Interventions Encouraged to attend Cardiac Rehabilitation for the exercise;Relaxation education           Psychosocial Discharge (Final Psychosocial Re-Evaluation):     Psychosocial Re-Evaluation - 02/17/16 0930      Psychosocial Re-Evaluation   Comments Ed states that he is doing well without stress and depression. He is to meet with the RD next week  and he hopes to have more specific information for his nutriition goals. He is participating in class ansd is asking questions concerning how to care for himself.  Goal is continue making healthy lifestyle changes without signs of stress or depression.   Interventions Encouraged to attend Cardiac Rehabilitation for the exercise;Relaxation education      Vocational Rehabilitation: Provide vocational rehab assistance to qualifying candidates.   Vocational Rehab Evaluation & Intervention:     Vocational Rehab - 02/06/16 1359      Initial Vocational Rehab Evaluation & Intervention   Assessment shows need for Vocational Rehabilitation No      Education: Education Goals: Education classes will be provided on a weekly basis, covering required topics. Participant will state understanding/return demonstration of topics presented.  Learning Barriers/Preferences:     Learning Barriers/Preferences - 02/06/16 1359      Learning Barriers/Preferences   Learning Barriers None   Learning Preferences None      Education Topics: General Nutrition Guidelines/Fats and Fiber: -Group instruction provided by verbal, written material, models and posters to present the general guidelines for heart healthy nutrition. Gives an explanation and review of dietary fats and fiber.   Controlling Sodium/Reading Food Labels: -Group verbal and written material supporting the discussion of sodium use in heart healthy nutrition. Review and explanation with models, verbal and written materials for utilization of the food label.   Exercise Physiology & Risk Factors: - Group verbal and written instruction with models to review the exercise physiology of the  cardiovascular system and associated critical values. Details cardiovascular disease risk factors and the goals associated with each risk factor.   Aerobic Exercise & Resistance Training: - Gives group verbal and written discussion on the health impact of inactivity. On the components of aerobic and resistive training programs and the benefits of this training and how to safely progress through these programs.   Flexibility, Balance, General Exercise Guidelines: - Provides group verbal and written instruction on the benefits of flexibility and balance training programs. Provides general exercise guidelines with specific guidelines to those with heart or lung disease. Demonstration and skill practice provided.   Stress Management: - Provides group verbal and written instruction about the health risks of elevated stress, cause of high stress, and healthy ways to reduce stress. Flowsheet Row Cardiac Rehab from 02/29/2016 in [facility] Cardiac and Pulmonary Rehab  Date  02/15/16  Educator  [facility]  Instruction Review Code  2- meets goals/outcomes      Depression: - Provides group verbal and written instruction on the correlation between heart/lung disease and depressed mood,  treatment options, and the stigmas associated with seeking treatment.   Anatomy & Physiology of the Heart: - Group verbal and written instruction and models provide basic cardiac anatomy and physiology, with the coronary electrical and arterial systems. Review of: AMI, Angina, Valve disease, Heart Failure, Cardiac Arrhythmia, Pacemakers, and the ICD.   Cardiac Procedures: - Group verbal and written instruction and models to describe the testing methods done to diagnose heart disease. Reviews the outcomes of the test results. Describes the treatment choices: Medical Management, Angioplasty, or Coronary Bypass Surgery. Flowsheet Row Cardiac Rehab from 02/29/2016 in [facility] Cardiac and Pulmonary Rehab  Date  02/20/16  Educator  CE   Instruction Review Code  2- meets goals/outcomes      Cardiac Medications: - Group verbal and written instruction to review commonly prescribed medications for heart disease. Reviews the medication, class of the drug, and side effects. Includes the steps to properly store meds and maintain the prescription regimen. Flowsheet Row Cardiac Rehab from 02/29/2016 in [facility] Cardiac and Pulmonary Rehab  Date  02/27/16 [02/29/16 Second Part]  Educator  CE  Instruction Review Code  2- meets goals/outcomes      Go Sex-Intimacy & Heart Disease, Get SMART - Goal Setting: - Group verbal and written instruction through game format to discuss heart disease and the return to sexual intimacy. Provides group verbal and written material to discuss and apply goal setting through the application of the S.M.A.R.T. Method. Flowsheet Row Cardiac Rehab from 02/29/2016 in [facility] Cardiac and Pulmonary Rehab  Date  02/20/16  Educator  CE  Instruction Review Code  2- meets goals/outcomes      Other Matters of the Heart: - Provides group verbal, written materials and models to describe Heart Failure, Angina, Valve Disease, and Diabetes in the realm of heart disease. Includes description of the disease process and treatment options available to the cardiac patient.   Exercise & Equipment Safety: - Individual verbal instruction and demonstration of equipment use and safety with use of the equipment. Flowsheet Row Cardiac Rehab from 02/29/2016 in [facility] Cardiac and Pulmonary Rehab  Date  02/06/16  Educator  SB  Instruction Review Code  2- meets goals/outcomes      Infection Prevention: - Provides verbal and written material to individual with discussion of infection control including proper hand washing and proper equipment cleaning during exercise session. Flowsheet Row Cardiac Rehab from 02/29/2016 in [facility] Cardiac and Pulmonary Rehab  Date  02/06/16  Educator  SB  Instruction Review Code  2- meets goals/outcomes       Falls Prevention: - Provides verbal and written material to individual with discussion of falls prevention and safety. Flowsheet Row Cardiac Rehab from 02/29/2016 in [facility] Cardiac and Pulmonary Rehab  Date  02/06/16  Educator  SB  Instruction Review Code  2- meets goals/outcomes      Diabetes: - Individual verbal and written instruction to review signs/symptoms of diabetes, desired ranges of glucose level fasting, after meals and with exercise. Advice that pre and post exercise glucose checks will be done for 3 sessions at entry of program.    Knowledge Questionnaire Score:     Knowledge Questionnaire Score - 02/06/16 1359      Knowledge Questionnaire Score   Pre Score 26/28 Correct responses reviewed with statement of understanding of response.      Core Components/Risk Factors/Patient Goals at Admission:     Personal Goals and Risk Factors at Admission - 02/06/16 1350      Core Components/Risk Factors/Patient  Goals on Admission    Weight Management Obesity;Weight Loss;Yes   Intervention Weight Management: Develop a combined nutrition and exercise program designed to reach desired caloric intake, while maintaining appropriate intake of nutrient and fiber, sodium and fats, and appropriate energy expenditure required for the weight goal.;Weight Management/Obesity: Establish reasonable short term and long term weight goals.;Obesity: Provide education and appropriate resources to help participant work on and attain dietary goals.   Admit Weight 242 lb 8 oz (110 kg)   Goal Weight: Short Term 238 lb (108 kg)   Goal Weight: Long Term 220 lb (99.8 kg)   Expected Outcomes Short Term: Continue to assess and modify interventions until short term weight is achieved;Long Term: Adherence to nutrition and physical activity/exercise program aimed toward attainment of established weight goal;Weight Loss: Understanding of general recommendations for a balanced deficit meal plan, which promotes  1-2 lb weight loss per week and includes a negative energy balance of [phone] kcal/d   Sedentary Yes   Intervention Provide advice, education, support and counseling about physical activity/exercise needs.;Develop an individualized exercise prescription for aerobic and resistive training based on initial evaluation findings, risk stratification, comorbidities and participant's personal goals.   Expected Outcomes Achievement of increased cardiorespiratory fitness and enhanced flexibility, muscular endurance and strength shown through measurements of functional capacity and personal statement of participant.   Increase Strength and Stamina Yes   Intervention Provide advice, education, support and counseling about physical activity/exercise needs.;Develop an individualized exercise prescription for aerobic and resistive training based on initial evaluation findings, risk stratification, comorbidities and participant's personal goals.   Expected Outcomes Achievement of increased cardiorespiratory fitness and enhanced flexibility, muscular endurance and strength shown through measurements of functional capacity and personal statement of participant.   Hypertension Yes   Intervention Provide education on lifestyle modifcations including regular physical activity/exercise, weight management, moderate sodium restriction and increased consumption of fresh fruit, vegetables, and low fat dairy, alcohol moderation, and smoking cessation.;Monitor prescription use compliance.   Expected Outcomes Short Term: Continued assessment and intervention until BP is < 140/5m HG in hypertensive participants. < 130/824mHG in hypertensive participants with diabetes, heart failure or chronic kidney disease.;Long Term: Maintenance of blood pressure at goal levels.   Lipids Yes   Intervention Provide education and support for participant on nutrition & aerobic/resistive exercise along with prescribed medications to achieve LDL  <7062mHDL >81m76m Expected Outcomes Short Term: Participant states understanding of desired cholesterol values and is compliant with medications prescribed. Participant is following exercise prescription and nutrition guidelines.;Long Term: Cholesterol controlled with medications as prescribed, with individualized exercise RX and with personalized nutrition plan. Value goals: LDL < 70mg56mL > 40 mg.      Core Components/Risk Factors/Patient Goals Review:      Goals and Risk Factor Review    Row Name 02/17/16 0927             Core Components/Risk Factors/Patient Goals Review   Personal Goals Review Weight Management/Obesity;Lipids;Hypertension       Review Ed's weight is down to 239 lbs from admit weight of 242 lb. He stated that he is "eating less garbage". No snacks between meals and eating alot of chicken and fish. He is compliant taking his medications. BP readings are in good range and he has not had a Lipid panel done recently.        Expected Outcomes Continued progress with goals, meeting the RD next week to review nutriton plan and set goals. Maintenance of healthy lifestyle  changes.           Core Components/Risk Factors/Patient Goals at Discharge (Final Review):      Goals and Risk Factor Review - 02/17/16 0927      Core Components/Risk Factors/Patient Goals Review   Personal Goals Review Weight Management/Obesity;Lipids;Hypertension   Review Ed's weight is down to 239 lbs from admit weight of 242 lb. He stated that he is "eating less garbage". No snacks between meals and eating alot of chicken and fish. He is compliant taking his medications. BP readings are in good range and he has not had a Lipid panel done recently.    Expected Outcomes Continued progress with goals, meeting the RD next week to review nutriton plan and set goals. Maintenance of healthy lifestyle changes.       ITP Comments:     ITP Comments    Row Name 02/06/16 1346 02/08/16 0607 02/15/16 0850  03/07/16 0542     ITP Comments Medical review completed. Initial ITP created. Documentation of diagnosis can be found in [facility] Visit 01/12/2016 30 day review. Continue with ITP unless directed changes per Medical Director review.  New to program Ed presented this morning complaining of chest soreness.  He said that it had started yesterday evening and he really felt it this morning.  He thought it was musculoskeletal but was not sure given his stent history.  He was about a 3/10 on the pain scale with it.  It hurt more when he moved.  He was given a SL NTG with no relief.  Staff felt it was best to have him talk with cardiology.  He was walked down to office and met with Nurse.  They agreed that it was musculoskeletal and encourged him to take ibuprofen.  They called rehab to release to exercise.  He was able to exercise without complaint but did not do any arms today. 30 day review. Continue with ITP unless directed changes per Medical Director review       Comments:

## 2016-03-09 ENCOUNTER — Telehealth: Payer: Self-pay | Admitting: *Deleted

## 2016-03-09 ENCOUNTER — Encounter: Payer: Self-pay | Admitting: *Deleted

## 2016-03-09 ENCOUNTER — Encounter: Payer: Managed Care, Other (non HMO) | Attending: Cardiology

## 2016-03-09 DIAGNOSIS — Z955 Presence of coronary angioplasty implant and graft: Secondary | ICD-10-CM | POA: Insufficient documentation

## 2016-03-09 DIAGNOSIS — I213 ST elevation (STEMI) myocardial infarction of unspecified site: Secondary | ICD-10-CM

## 2016-03-09 NOTE — Progress Notes (Signed)
Discharge Summary  Patient Details  Name: Shawn Elliott MRN: [id number] Date of Birth: Oct 24, 1955 Referring Provider:   Flowsheet Row Cardiac Rehab from 02/06/2016 in [facility]  Referring Provider  Almond Lint MD       Number of Visits: 13/36  Reason for Discharge:  Patient reached a stable level of exercise. Patient independent in their exercise. Early Exit:  Insurance and Personal  Smoking History:  History  Smoking Status  . Former Smoker  . Types: Pipe  . Quit date: 1982  Smokeless Tobacco  . Never Used    Diagnosis:  ST elevation myocardial infarction (STEMI), unspecified artery (HCC)  Status post coronary artery stent placement  ADL [facility]:   Initial Exercise Prescription:     Initial Exercise Prescription - 02/06/16 1500      Date of Initial Exercise RX and Referring Provider   Date 02/06/16   Referring Provider Almond Lint MD     Treadmill   MPH 2.5   Grade 0.5   Minutes 15   METs 3.09     REL-XR   Level 3   Minutes 15   METs 2     T5 Nustep   Level 3   Minutes 15   METs 2     Prescription Details   Frequency (times per week) 3   Duration Progress to 45 minutes of aerobic exercise without signs/symptoms of physical distress     Intensity   THRR 40-80% of Max Heartrate 103-142   Ratings of Perceived Exertion 11-15   Perceived Dyspnea 0-4     Progression   Progression Continue to progress workloads to maintain intensity without signs/symptoms of physical distress.     Resistance Training   Training Prescription Yes   Weight 3 lbs   Reps 10-15      Discharge Exercise Prescription (Final Exercise Prescription Changes):     Exercise Prescription Changes - 02/28/16 1400      Response to Exercise   Blood Pressure (Admit) 134/64   Blood Pressure (Exercise) 150/82   Blood Pressure (Exit) 120/80   Heart Rate (Admit) 70 bpm   Heart Rate (Exercise) 100 bpm   Heart Rate (Exit) 61 bpm   Rating of Perceived  Exertion (Exercise) 13   Symptoms none   Comments Home Exercise Guidelines given 02/22/16   Duration Progress to 45 minutes of aerobic exercise without signs/symptoms of physical distress   Intensity THRR unchanged     Progression   Progression Continue to progress workloads to maintain intensity without signs/symptoms of physical distress.   Average METs 3.42     Resistance Training   Training Prescription Yes   Weight 3 lbs   Reps 10-15     Interval Training   Interval Training No     Treadmill   MPH 2.5   Grade 1   Minutes 15   METs 3.26     REL-XR   Level 6   Minutes 15   METs 3.9     T5 Nustep   Level 6   Minutes 15   METs 3.1     Home Exercise Plan   Plans to continue exercise at Home  walking   Frequency Add 3 additional days to program exercise sessions.     Exercise Review   Progression Yes      Functional Capacity:     6 Minute Walk    Row Name 02/06/16 1516  6 Minute Walk   Phase Initial     Distance 1384 feet     Walk Time 6 minutes     # of Rest Breaks 0     MPH 2.62     METS 3.33     RPE 12     Perceived Dyspnea  1     VO2 Peak 11.67     Symptoms Yes (comment)     Comments some SOB     Resting HR 65 bpm     Resting BP 152/74     Max Ex. HR 83 bpm     Max Ex. BP 132/74     2 Minute Post BP 122/64        Psychological, QOL, Others - Outcomes: PHQ 2/9: Depression screen PHQ 2/9 02/06/2016  Decreased Interest 0  Down, Depressed, Hopeless 0  PHQ - 2 Score 0  Altered sleeping 0  Tired, decreased energy 0  Change in appetite 0  Feeling bad or failure about yourself  0  Trouble concentrating 0  Moving slowly or fidgety/restless 0  Suicidal thoughts 0  PHQ-9 Score 0  Difficult doing work/chores Not difficult at all    Quality of Life:     Quality of Life - 02/06/16 1357      Quality of Life Scores   Health/Function Pre 27.17 %   Socioeconomic Pre 28.27 %   Psych/Spiritual Pre 28.29 %   Family Pre 30 %    GLOBAL Pre 28.03 %      Personal Goals: Goals established at orientation with interventions provided to work toward goal.     Personal Goals and Risk Factors at Admission - 02/06/16 1350      Core Components/Risk Factors/Patient Goals on Admission    Weight Management Obesity;Weight Loss;Yes   Intervention Weight Management: Develop a combined nutrition and exercise program designed to reach desired caloric intake, while maintaining appropriate intake of nutrient and fiber, sodium and fats, and appropriate energy expenditure required for the weight goal.;Weight Management/Obesity: Establish reasonable short term and long term weight goals.;Obesity: Provide education and appropriate resources to help participant work on and attain dietary goals.   Admit Weight 242 lb 8 oz (110 kg)   Goal Weight: Short Term 238 lb (108 kg)   Goal Weight: Long Term 220 lb (99.8 kg)   Expected Outcomes Short Term: Continue to assess and modify interventions until short term weight is achieved;Long Term: Adherence to nutrition and physical activity/exercise program aimed toward attainment of established weight goal;Weight Loss: Understanding of general recommendations for a balanced deficit meal plan, which promotes 1-2 lb weight loss per week and includes a negative energy balance of [phone] kcal/d   Sedentary Yes   Intervention Provide advice, education, support and counseling about physical activity/exercise needs.;Develop an individualized exercise prescription for aerobic and resistive training based on initial evaluation findings, risk stratification, comorbidities and participant's personal goals.   Expected Outcomes Achievement of increased cardiorespiratory fitness and enhanced flexibility, muscular endurance and strength shown through measurements of functional capacity and personal statement of participant.   Increase Strength and Stamina Yes   Intervention Provide advice, education, support and  counseling about physical activity/exercise needs.;Develop an individualized exercise prescription for aerobic and resistive training based on initial evaluation findings, risk stratification, comorbidities and participant's personal goals.   Expected Outcomes Achievement of increased cardiorespiratory fitness and enhanced flexibility, muscular endurance and strength shown through measurements of functional capacity and personal statement of participant.   Hypertension Yes  Intervention Provide education on lifestyle modifcations including regular physical activity/exercise, weight management, moderate sodium restriction and increased consumption of fresh fruit, vegetables, and low fat dairy, alcohol moderation, and smoking cessation.;Monitor prescription use compliance.   Expected Outcomes Short Term: Continued assessment and intervention until BP is < 140/82mm HG in hypertensive participants. < 130/52mm HG in hypertensive participants with diabetes, heart failure or chronic kidney disease.;Long Term: Maintenance of blood pressure at goal levels.   Lipids Yes   Intervention Provide education and support for participant on nutrition & aerobic/resistive exercise along with prescribed medications to achieve LDL 70mg , HDL >40mg .   Expected Outcomes Short Term: Participant states understanding of desired cholesterol values and is compliant with medications prescribed. Participant is following exercise prescription and nutrition guidelines.;Long Term: Cholesterol controlled with medications as prescribed, with individualized exercise RX and with personalized nutrition plan. Value goals: LDL < 70mg , HDL > 40 mg.       Personal Goals Discharge:     Goals and Risk Factor Review    Row Name 02/17/16 0927             Core Components/Risk Factors/Patient Goals Review   Personal Goals Review Weight Management/Obesity;Lipids;Hypertension       Review Ed's weight is down to 239 lbs from admit weight of  242 lb. He stated that he is "eating less garbage". No snacks between meals and eating alot of chicken and fish. He is compliant taking his medications. BP readings are in good range and he has not had a Lipid panel done recently.        Expected Outcomes Continued progress with goals, meeting the RD next week to review nutriton plan and set goals. Maintenance of healthy lifestyle changes.           Nutrition & Weight - Outcomes:     Pre Biometrics - 02/06/16 1519      Pre Biometrics   Height 6\' 2"  (1.88 m)   Weight 242 lb 8 oz (110 kg)   Waist Circumference 43 inches   Hip Circumference 43 inches   Waist to Hip Ratio 1 %   BMI (Calculated) 31.2   Single Leg Stand 30 seconds       Nutrition:     Nutrition Therapy & Goals - 02/20/16 1229      Nutrition Therapy   Diet Instructed patient accompanied by his wife on heart healthy dietary guidelines based on 2000 calories.   Drug/Food Interactions Statins/Certain Fruits   Protein (specify units) 8   Fiber 30 grams   Whole Grain Foods 3 servings   Saturated Fats 14 max. grams   Fruits and Vegetables 5 servings/day   Sodium 2000 grams  1500mg  ideal     Personal Nutrition Goals   Nutrition Goal Use my fitnesspal to track food and beverage intake.   Personal Goal #2 Read labels for saturated fat, trans fat and sodium.   Personal Goal #3 Fill half of the plate with non-starchy vegetables and fruit   Personal Goal #4 Try fruit and nuts as a snack.     Intervention Plan   Intervention Prescribe, educate and counsel regarding individualized specific dietary modifications aiming towards targeted core components such as weight, hypertension, lipid management, diabetes, heart failure and other comorbidities.;Nutrition handout(s) given to patient.   Expected Outcomes Short Term Goal: Understand basic principles of dietary content, such as calories, fat, sodium, cholesterol and nutrients.;Short Term Goal: A plan has been developed with  personal nutrition goals set during dietitian  appointment.;Long Term Goal: Adherence to prescribed nutrition plan.      Nutrition Discharge:     Nutrition Assessments - 02/06/16 1349      MEDFICTS Scores   Pre Score 26      Education Questionnaire Score:     Knowledge Questionnaire Score - 02/06/16 1359      Knowledge Questionnaire Score   Pre Score 26/28 Correct responses reviewed with statement of understanding of response.      Goals reviewed with patient; copy given to patient.

## 2016-03-09 NOTE — Progress Notes (Signed)
Cardiac Individual Treatment Plan  Patient Details  Name: Shawn Elliott MRN: [id number] Date of Birth: September 29, 1955 Referring Provider:   Flowsheet Row Cardiac Rehab from 02/06/2016 in [facility] Cardiac and Pulmonary Rehab  Referring Provider  Wende Bushy MD      Initial Encounter Date:  Flowsheet Row Cardiac Rehab from 02/06/2016 in [facility] Cardiac and Pulmonary Rehab  Date  02/06/16  Referring Provider  Wende Bushy MD      Visit Diagnosis: ST elevation myocardial infarction (STEMI), unspecified artery [facility])  Status post coronary artery stent placement  Patient's Home Medications on Admission:  Current Outpatient Prescriptions:  .  aspirin EC 81 MG tablet, Take 81 mg by mouth daily., Disp: , Rfl:  .  atorvastatin (LIPITOR) 80 MG tablet, Take 1 tablet (80 mg total) by mouth daily at 6 PM., Disp: 90 tablet, Rfl: 3 .  isosorbide mononitrate (IMDUR) 30 MG 24 hr tablet, Take 1 tablet (30 mg total) by mouth daily., Disp: 90 tablet, Rfl: 3 .  lisinopril (PRINIVIL,ZESTRIL) 2.5 MG tablet, Take 1 tablet (2.5 mg total) by mouth daily., Disp: 90 tablet, Rfl: 3 .  metoprolol tartrate (LOPRESSOR) 25 MG tablet, Take 0.5 tablets (12.5 mg total) by mouth 2 (two) times daily., Disp: 90 tablet, Rfl: 3 .  nitroGLYCERIN (NITROSTAT) 0.4 MG SL tablet, Place 1 tablet (0.4 mg total) under the tongue every 5 (five) minutes as needed for chest pain., Disp: 25 tablet, Rfl: 2 .  omeprazole (PRILOSEC) 20 MG capsule, Take 20 mg by mouth daily., Disp: , Rfl:  .  ticagrelor (BRILINTA) 90 MG TABS tablet, Take 1 tablet (90 mg total) by mouth 2 (two) times daily., Disp: 180 tablet, Rfl: 3 .  tizanidine (ZANAFLEX) 2 MG capsule, Take 2 mg by mouth 3 (three) times daily as needed for muscle spasms., Disp: , Rfl:   Past Medical History: Past Medical History:  Diagnosis Date  . Coronary artery disease   . GERD (gastroesophageal reflux disease)   . High cholesterol   . History of kidney stones    "passed them" (01/17/2016)   . Hypertension   . PONV (postoperative nausea and vomiting)    "when I woke up from my hernia I was nauseated"  . STEMI (ST elevation myocardial infarction) ([place]) 01/12/2016    Tobacco Use: History  Smoking Status  . Former Smoker  . Types: Pipe  . Quit date: 1982  Smokeless Tobacco  . Never Used    Labs: Recent [employer] for ITP Cardiac and Pulmonary Rehab Latest Ref Rng & Units 01/12/2016   Cholestrol 0 - 200 mg/dL 266(H)   LDLCALC 0 - 99 mg/dL 182(H)   HDL >40 mg/dL 47   Trlycerides <150 mg/dL 183(H)   Hemoglobin A1c 4.8 - 5.6 % 5.4   TCO2 0 - 100 mmol/L 21       Exercise Target Goals:    Exercise Program Goal: Individual exercise prescription set with THRR, safety & activity barriers. Participant demonstrates ability to understand and report RPE using BORG scale, to self-measure pulse accurately, and to acknowledge the importance of the exercise prescription.  Exercise Prescription Goal: Starting with aerobic activity 30 plus minutes a day, 3 days per week for initial exercise prescription. Provide home exercise prescription and guidelines that participant acknowledges understanding prior to discharge.  Activity Barriers & Risk Stratification:     Activity Barriers & Cardiac Risk Stratification - 02/06/16 1400      Activity Barriers & Cardiac Risk Stratification  Activity Barriers --  Did state that right shoulder "catches" every now and then.    Cardiac Risk Stratification High      6 Minute Walk:     6 Minute Walk    Row Name 02/06/16 1516         6 Minute Walk   Phase Initial     Distance 1384 feet     Walk Time 6 minutes     # of Rest Breaks 0     MPH 2.62     METS 3.33     RPE 12     Perceived Dyspnea  1     VO2 Peak 11.67     Symptoms Yes (comment)     Comments some SOB     Resting HR 65 bpm     Resting BP 152/74     Max Ex. HR 83 bpm     Max Ex. BP 132/74     2 Minute Post BP 122/64        Oxygen Initial  Assessment:   Oxygen Re-Evaluation:   Oxygen Discharge (Final Oxygen Re-Evaluation):   Initial Exercise Prescription:     Initial Exercise Prescription - 02/06/16 1500      Date of Initial Exercise RX and Referring Provider   Date 02/06/16   Referring Provider Wende Bushy MD     Treadmill   MPH 2.5   Grade 0.5   Minutes 15   METs 3.09     REL-XR   Level 3   Minutes 15   METs 2     T5 Nustep   Level 3   Minutes 15   METs 2     Prescription Details   Frequency (times per week) 3   Duration Progress to 45 minutes of aerobic exercise without signs/symptoms of physical distress     Intensity   THRR 40-80% of Max Heartrate 103-142   Ratings of Perceived Exertion 11-15   Perceived Dyspnea 0-4     Progression   Progression Continue to progress workloads to maintain intensity without signs/symptoms of physical distress.     Resistance Training   Training Prescription Yes   Weight 3 lbs   Reps 10-15      Perform Capillary Blood Glucose checks as needed.  Exercise Prescription Changes:     Exercise Prescription Changes    Row Name 02/06/16 1400 02/13/16 1500 02/22/16 0900 02/28/16 1400       Response to Exercise   Blood Pressure (Admit) 152/74 114/60  - 134/64    Blood Pressure (Exercise) 132/74 122/70  - 150/82    Blood Pressure (Exit) 122/64 114/60  - 120/80    Heart Rate (Admit) 65 bpm 63 bpm  - 70 bpm    Heart Rate (Exercise) 83 bpm 109 bpm  - 100 bpm    Heart Rate (Exit) 66 bpm 69 bpm  - 61 bpm    Oxygen Saturation (Admit) 98 %  -  -  -    Oxygen Saturation (Exercise) 97 %  -  -  -    Rating of Perceived Exertion (Exercise) 12 13  - 13    Perceived Dyspnea (Exercise) 1  -  -  -    Symptoms some SOB none none none    Comments  -  - Home Exercise Guidelines given 02/22/16 Home Exercise Guidelines given 02/22/16    Duration  - Progress to 45 minutes of aerobic exercise without signs/symptoms of physical distress Progress to  45 minutes of aerobic  exercise without signs/symptoms of physical distress Progress to 45 minutes of aerobic exercise without signs/symptoms of physical distress    Intensity  - THRR unchanged THRR unchanged THRR unchanged      Progression   Progression  - Continue to progress workloads to maintain intensity without signs/symptoms of physical distress. Continue to progress workloads to maintain intensity without signs/symptoms of physical distress. Continue to progress workloads to maintain intensity without signs/symptoms of physical distress.    Average METs  - 2.75 2.75 3.42      Resistance Training   Training Prescription  - Yes Yes Yes    Weight  - 3 lbs 3 lbs 3 lbs    Reps  - 10-15 10-15 10-15      Interval Training   Interval Training  - No No No      Treadmill   MPH  - 2.5 2.5 2.5    Grade  - 0.5 0.5 1    Minutes  - _0 METs  - 3.09 3.09 3.26      REL-XR   Level  - _1 Minutes  - _2 METs  - 2.7 2.7 3.9      T5 Nustep   Level  - _3 Minutes  - _4 METs  - 2.3 2.3 3.1      Home Exercise Plan   Plans to continue exercise at  -  - Home  walking Home  walking    Frequency  -  - Add 3 additional days to program exercise sessions. Add 3 additional days to program exercise sessions.      Exercise Review   Progression -  walk test results Yes Yes Yes       Exercise Comments:     Exercise Comments    Row Name 02/08/16 1000 02/13/16 1510 02/22/16 0921 02/28/16 1457     Exercise Comments First full day of exercise!  Patient was oriented to gym and equipment including functions, settings, policies, and procedures.  Patient's individual exercise prescription and treatment plan were reviewed.  All starting workloads were established based on the results of the 6 minute walk test done at initial orientation visit.  The plan for exercise progression was also introduced and progression will be customized based on patient's performance and goals. Shawn Elliott has done well  for his first two full visits with exercise.  He is already up to 2.7 METs on the XR!  We will continue to monitor for progression. Reviewed home exercise with pt today.  Pt plans to walk at home for exercise.  Reviewed THR, pulse, RPE, sign and symptoms, NTG use, and when to call 911 or MD.  Also discussed weather considerations and indoor options.  Pt voiced understanding. Shawn Elliott has been doing well in rehab.  He is now up to level 6 on the NuStep!  We will continue to monitor his progression.       Exercise Goals and Review:   Exercise Goals Re-Evaluation :   Discharge Exercise Prescription (Final Exercise Prescription Changes):     Exercise Prescription Changes - 02/28/16 1400      Response to Exercise   Blood Pressure (Admit) 134/64   Blood Pressure (Exercise) 150/82   Blood Pressure (Exit) 120/80   Heart Rate (Admit) 70 bpm   Heart Rate (Exercise) 100 bpm  Heart Rate (Exit) 61 bpm   Rating of Perceived Exertion (Exercise) 13   Symptoms none   Comments Home Exercise Guidelines given 02/22/16   Duration Progress to 45 minutes of aerobic exercise without signs/symptoms of physical distress   Intensity THRR unchanged     Progression   Progression Continue to progress workloads to maintain intensity without signs/symptoms of physical distress.   Average METs 3.42     Resistance Training   Training Prescription Yes   Weight 3 lbs   Reps 10-15     Interval Training   Interval Training No     Treadmill   MPH 2.5   Grade 1   Minutes 15   METs 3.26     REL-XR   Level 6   Minutes 15   METs 3.9     T5 Nustep   Level 6   Minutes 15   METs 3.1     Home Exercise Plan   Plans to continue exercise at Home  walking   Frequency Add 3 additional days to program exercise sessions.     Exercise Review   Progression Yes      Nutrition:  Target Goals: Understanding of nutrition guidelines, daily intake of sodium <1570m, cholesterol <2052m calories 30% from fat and 7% or  less from saturated fats, daily to have 5 or more servings of fruits and vegetables.  Biometrics:     Pre Biometrics - 02/06/16 1519      Pre Biometrics   Height _0  (1.88 m)   Weight 242 lb 8 oz (110 kg)   Waist Circumference 43 inches   Hip Circumference 43 inches   Waist to Hip Ratio 1 %   BMI (Calculated) 31.2   Single Leg Stand 30 seconds       Nutrition Therapy Plan and Nutrition Goals:     Nutrition Therapy & Goals - 02/20/16 1229      Nutrition Therapy   Diet Instructed patient accompanied by his wife on heart healthy dietary guidelines based on 2000 calories.   Drug/Food Interactions Statins/Certain Fruits   Protein (specify units) 8   Fiber 30 grams   Whole Grain Foods 3 servings   Saturated Fats 14 max. grams   Fruits and Vegetables 5 servings/day   Sodium 2000 grams  [id number]     Personal Nutrition Goals   Nutrition Goal Use my fitnesspal to track food and beverage intake.   Personal Goal #2 Read labels for saturated fat, trans fat and sodium.   Personal Goal #3 Fill half of the plate with non-starchy vegetables and fruit   Personal Goal #4 Try fruit and nuts as a snack.     Intervention Plan   Intervention Prescribe, educate and counsel regarding individualized specific dietary modifications aiming towards targeted core components such as weight, hypertension, lipid management, diabetes, heart failure and other comorbidities.;Nutrition handout(s) given to patient.   Expected Outcomes Short Term Goal: Understand basic principles of dietary content, such as calories, fat, sodium, cholesterol and nutrients.;Short Term Goal: A plan has been developed with personal nutrition goals set during dietitian appointment.;Long Term Goal: Adherence to prescribed nutrition plan.      Nutrition Discharge: Rate Your Plate Scores:     Nutrition Assessments - 02/06/16 1349      MEDFICTS Scores   Pre Score 26      Nutrition Goals Re-Evaluation:   Nutrition  Goals Discharge (Final Nutrition Goals Re-Evaluation):   Psychosocial: Target Goals: Acknowledge presence or absence  of significant depression and/or stress, maximize coping skills, provide positive support system. Participant is able to verbalize types and ability to use techniques and skills needed for reducing stress and depression.   Initial Review & Psychosocial Screening:     Initial Psych Review & Screening - 02/06/16 1351      Initial Review   Current issues with --  None reported. 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 Yes  Family and Friends     Barriers   Psychosocial barriers to participate in program There are no identifiable barriers or psychosocial needs.;The patient should benefit from training in stress management and relaxation.     Screening Interventions   Interventions Encouraged to exercise      Quality of Life Scores:      Quality of Life - 02/06/16 1357      Quality of Life Scores   Health/Function Pre 27.17 %   Socioeconomic Pre 28.27 %   Psych/Spiritual Pre 28.29 %   Family Pre 30 %   GLOBAL Pre 28.03 %      PHQ-9: Recent Review Flowsheet Data    Depression screen [facility] 2/9 02/06/2016   Decreased Interest 0   Down, Depressed, Hopeless 0   PHQ - 2 Score 0   Altered sleeping 0   Tired, decreased energy 0   Change in appetite 0   Feeling bad or failure about yourself  0   Trouble concentrating 0   Moving slowly or fidgety/restless 0   Suicidal thoughts 0   PHQ-9 Score 0   Difficult doing work/chores Not difficult at all     Interpretation of Total Score  Total Score Depression Severity:  1-4 = Minimal depression, 5-9 = Mild depression, 10-14 = Moderate depression, 15-19 = Moderately severe depression, 20-27 = Severe depression   Psychosocial Evaluation and Intervention:     Psychosocial  Evaluation - 02/08/16 0945      Psychosocial Evaluation & Interventions   Interventions Encouraged to exercise with the program and follow exercise prescription;Stress management education;Relaxation education   Comments Counselor met with Shawn Elliott (Shawn Elliott) today for initial psychosocial evaluation.  He is a 60 (almost 36) year old gentleman who had a heart attack and stents earlier this month.  He has a large support system with family, friends and church involvement.  He has a spouse of 26 years.  Shawn Elliott states he is in good health other than his heart and that he sleeps better now since the surgery; and has a healthy appetite.  He denies a history of depression or anxiety or any current symptoms.  It is obvious Shawn Elliott was a bit traumatized by his heart attack and the subsequent procedures as he shared his story in a very detailed manner.  It reports it feels better to talk about it.  Shawn Elliott states he is typically a positive person with minimal stress in his life.  He has some current side effects from new medications that cause shortness of breath and this creates some stress for him, but he feels sure he will adjust.  Shawn Elliott has goals to lose weight; learn to exercise more consistently and and learn about healthier nutrition and diet.  Staff will be following with Shawn Elliott throughout the course of this program.        Psychosocial Re-Evaluation:     Psychosocial  Re-Evaluation    Row Name 02/17/16 0930             Psychosocial Re-Evaluation   Comments Shawn Elliott states that he is doing well without stress and depression. He is to meet with the RD next week  and he hopes to have more specific information for his nutriition goals. He is participating in class ansd is asking questions concerning how to care for himself.  Goal is continue making healthy lifestyle changes without signs of stress or depression.       Interventions Encouraged to attend Cardiac Rehabilitation for the exercise;Relaxation education           Psychosocial Discharge (Final Psychosocial Re-Evaluation):     Psychosocial Re-Evaluation - 02/17/16 0930      Psychosocial Re-Evaluation   Comments Shawn Elliott states that he is doing well without stress and depression. He is to meet with the RD next week  and he hopes to have more specific information for his nutriition goals. He is participating in class ansd is asking questions concerning how to care for himself.  Goal is continue making healthy lifestyle changes without signs of stress or depression.   Interventions Encouraged to attend Cardiac Rehabilitation for the exercise;Relaxation education      Vocational Rehabilitation: Provide vocational rehab assistance to qualifying candidates.   Vocational Rehab Evaluation & Intervention:     Vocational Rehab - 02/06/16 1359      Initial Vocational Rehab Evaluation & Intervention   Assessment shows need for Vocational Rehabilitation No      Education: Education Goals: Education classes will be provided on a weekly basis, covering required topics. Participant will state understanding/return demonstration of topics presented.  Learning Barriers/Preferences:     Learning Barriers/Preferences - 02/06/16 1359      Learning Barriers/Preferences   Learning Barriers None   Learning Preferences None      Education Topics: General Nutrition Guidelines/Fats and Fiber: -Group instruction provided by verbal, written material, models and posters to present the general guidelines for heart healthy nutrition. Gives an explanation and review of dietary fats and fiber.   Controlling Sodium/Reading Food Labels: -Group verbal and written material supporting the discussion of sodium use in heart healthy nutrition. Review and explanation with models, verbal and written materials for utilization of the food label.   Exercise Physiology & Risk Factors: - Group verbal and written instruction with models to review the exercise physiology of the  cardiovascular system and associated critical values. Details cardiovascular disease risk factors and the goals associated with each risk factor.   Aerobic Exercise & Resistance Training: - Gives group verbal and written discussion on the health impact of inactivity. On the components of aerobic and resistive training programs and the benefits of this training and how to safely progress through these programs.   Flexibility, Balance, General Exercise Guidelines: - Provides group verbal and written instruction on the benefits of flexibility and balance training programs. Provides general exercise guidelines with specific guidelines to those with heart or lung disease. Demonstration and skill practice provided.   Stress Management: - Provides group verbal and written instruction about the health risks of elevated stress, cause of high stress, and healthy ways to reduce stress. Flowsheet Row Cardiac Rehab from 02/29/2016 in [facility] Cardiac and Pulmonary Rehab  Date  02/15/16  Educator  [facility]  Instruction Review Code  2- meets goals/outcomes      Depression: - Provides group verbal and written instruction on the correlation between heart/lung disease and depressed mood,  treatment options, and the stigmas associated with seeking treatment.   Anatomy & Physiology of the Heart: - Group verbal and written instruction and models provide basic cardiac anatomy and physiology, with the coronary electrical and arterial systems. Review of: AMI, Angina, Valve disease, Heart Failure, Cardiac Arrhythmia, Pacemakers, and the ICD.   Cardiac Procedures: - Group verbal and written instruction and models to describe the testing methods done to diagnose heart disease. Reviews the outcomes of the test results. Describes the treatment choices: Medical Management, Angioplasty, or Coronary Bypass Surgery. Flowsheet Row Cardiac Rehab from 02/29/2016 in [facility] Cardiac and Pulmonary Rehab  Date  02/20/16  Educator  CE   Instruction Review Code  2- meets goals/outcomes      Cardiac Medications: - Group verbal and written instruction to review commonly prescribed medications for heart disease. Reviews the medication, class of the drug, and side effects. Includes the steps to properly store meds and maintain the prescription regimen. Flowsheet Row Cardiac Rehab from 02/29/2016 in [facility] Cardiac and Pulmonary Rehab  Date  02/27/16 [02/29/16 Second Part]  Educator  CE  Instruction Review Code  2- meets goals/outcomes      Go Sex-Intimacy & Heart Disease, Get SMART - Goal Setting: - Group verbal and written instruction through game format to discuss heart disease and the return to sexual intimacy. Provides group verbal and written material to discuss and apply goal setting through the application of the S.M.A.R.T. Method. Flowsheet Row Cardiac Rehab from 02/29/2016 in [facility] Cardiac and Pulmonary Rehab  Date  02/20/16  Educator  CE  Instruction Review Code  2- meets goals/outcomes      Other Matters of the Heart: - Provides group verbal, written materials and models to describe Heart Failure, Angina, Valve Disease, and Diabetes in the realm of heart disease. Includes description of the disease process and treatment options available to the cardiac patient.   Exercise & Equipment Safety: - Individual verbal instruction and demonstration of equipment use and safety with use of the equipment. Flowsheet Row Cardiac Rehab from 02/29/2016 in [facility] Cardiac and Pulmonary Rehab  Date  02/06/16  Educator  SB  Instruction Review Code  2- meets goals/outcomes      Infection Prevention: - Provides verbal and written material to individual with discussion of infection control including proper hand washing and proper equipment cleaning during exercise session. Flowsheet Row Cardiac Rehab from 02/29/2016 in [facility] Cardiac and Pulmonary Rehab  Date  02/06/16  Educator  SB  Instruction Review Code  2- meets goals/outcomes       Falls Prevention: - Provides verbal and written material to individual with discussion of falls prevention and safety. Flowsheet Row Cardiac Rehab from 02/29/2016 in [facility] Cardiac and Pulmonary Rehab  Date  02/06/16  Educator  SB  Instruction Review Code  2- meets goals/outcomes      Diabetes: - Individual verbal and written instruction to review signs/symptoms of diabetes, desired ranges of glucose level fasting, after meals and with exercise. Advice that pre and post exercise glucose checks will be done for 3 sessions at entry of program.    Knowledge Questionnaire Score:     Knowledge Questionnaire Score - 02/06/16 1359      Knowledge Questionnaire Score   Pre Score 26/28 Correct responses reviewed with statement of understanding of response.      Core Components/Risk Factors/Patient Goals at Admission:     Personal Goals and Risk Factors at Admission - 02/06/16 1350      Core Components/Risk Factors/Patient  Goals on Admission    Weight Management Obesity;Weight Loss;Yes   Intervention Weight Management: Develop a combined nutrition and exercise program designed to reach desired caloric intake, while maintaining appropriate intake of nutrient and fiber, sodium and fats, and appropriate energy expenditure required for the weight goal.;Weight Management/Obesity: Establish reasonable short term and long term weight goals.;Obesity: Provide education and appropriate resources to help participant work on and attain dietary goals.   Admit Weight 242 lb 8 oz (110 kg)   Goal Weight: Short Term 238 lb (108 kg)   Goal Weight: Long Term 220 lb (99.8 kg)   Expected Outcomes Short Term: Continue to assess and modify interventions until short term weight is achieved;Long Term: Adherence to nutrition and physical activity/exercise program aimed toward attainment of established weight goal;Weight Loss: Understanding of general recommendations for a balanced deficit meal plan, which promotes  1-2 lb weight loss per week and includes a negative energy balance of [phone] kcal/d   Sedentary Yes   Intervention Provide advice, education, support and counseling about physical activity/exercise needs.;Develop an individualized exercise prescription for aerobic and resistive training based on initial evaluation findings, risk stratification, comorbidities and participant's personal goals.   Expected Outcomes Achievement of increased cardiorespiratory fitness and enhanced flexibility, muscular endurance and strength shown through measurements of functional capacity and personal statement of participant.   Increase Strength and Stamina Yes   Intervention Provide advice, education, support and counseling about physical activity/exercise needs.;Develop an individualized exercise prescription for aerobic and resistive training based on initial evaluation findings, risk stratification, comorbidities and participant's personal goals.   Expected Outcomes Achievement of increased cardiorespiratory fitness and enhanced flexibility, muscular endurance and strength shown through measurements of functional capacity and personal statement of participant.   Hypertension Yes   Intervention Provide education on lifestyle modifcations including regular physical activity/exercise, weight management, moderate sodium restriction and increased consumption of fresh fruit, vegetables, and low fat dairy, alcohol moderation, and smoking cessation.;Monitor prescription use compliance.   Expected Outcomes Short Term: Continued assessment and intervention until BP is < 140/5m HG in hypertensive participants. < 130/824mHG in hypertensive participants with diabetes, heart failure or chronic kidney disease.;Long Term: Maintenance of blood pressure at goal levels.   Lipids Yes   Intervention Provide education and support for participant on nutrition & aerobic/resistive exercise along with prescribed medications to achieve LDL  <7062mHDL >81m76m Expected Outcomes Short Term: Participant states understanding of desired cholesterol values and is compliant with medications prescribed. Participant is following exercise prescription and nutrition guidelines.;Long Term: Cholesterol controlled with medications as prescribed, with individualized exercise RX and with personalized nutrition plan. Value goals: LDL < 70mg56mL > 40 mg.      Core Components/Risk Factors/Patient Goals Review:      Goals and Risk Factor Review    Row Name 02/17/16 0927             Core Components/Risk Factors/Patient Goals Review   Personal Goals Review Weight Management/Obesity;Lipids;Hypertension       Review Shawn Elliott's weight is down to 239 lbs from admit weight of 242 lb. He stated that he is "eating less garbage". No snacks between meals and eating alot of chicken and fish. He is compliant taking his medications. BP readings are in good range and he has not had a Lipid panel done recently.        Expected Outcomes Continued progress with goals, meeting the RD next week to review nutriton plan and set goals. Maintenance of healthy lifestyle  changes.           Core Components/Risk Factors/Patient Goals at Discharge (Final Review):      Goals and Risk Factor Review - 02/17/16 0927      Core Components/Risk Factors/Patient Goals Review   Personal Goals Review Weight Management/Obesity;Lipids;Hypertension   Review Shawn Elliott's weight is down to 239 lbs from admit weight of 242 lb. He stated that he is "eating less garbage". No snacks between meals and eating alot of chicken and fish. He is compliant taking his medications. BP readings are in good range and he has not had a Lipid panel done recently.    Expected Outcomes Continued progress with goals, meeting the RD next week to review nutriton plan and set goals. Maintenance of healthy lifestyle changes.       ITP Comments:     ITP Comments    Row Name 02/06/16 1346 02/08/16 0607 02/15/16 0850  03/07/16 0542 03/09/16 1101   ITP Comments Medical review completed. Initial ITP created. Documentation of diagnosis can be found in [facility] Visit 01/12/2016 30 day review. Continue with ITP unless directed changes per Medical Director review.  New to program Shawn Elliott presented this morning complaining of chest soreness.  He said that it had started yesterday evening and he really felt it this morning.  He thought it was musculoskeletal but was not sure given his stent history.  He was about a 3/10 on the pain scale with it.  It hurt more when he moved.  He was given a SL NTG with no relief.  Staff felt it was best to have him talk with cardiology.  He was walked down to office and met with Nurse.  They agreed that it was musculoskeletal and encourged him to take ibuprofen.  They called rehab to release to exercise.  He was able to exercise without complaint but did not do any arms today. 30 day review. Continue with ITP unless directed changes per Medical Director review Called to check on Shawn Elliott as he had been out all week. He was cleared by his cardiologist.  He also got his insurance bills and he cannot afford the copays.  He would like to be discharged at this time.  He would like to join the [facility].      Comments: Discharge ITP

## 2016-03-09 NOTE — Telephone Encounter (Signed)
Called to check on Shawn Elliott as he had been out all week. He was cleared by his cardiologist.  He also got his insurance bills and he cannot afford the copays.  He would like to be discharged at this time.  He would like to join the [place].

## 2016-03-16 ENCOUNTER — Encounter (HOSPITAL_COMMUNITY): Payer: Self-pay | Admitting: *Deleted

## 2016-03-16 ENCOUNTER — Other Ambulatory Visit: Payer: Self-pay

## 2016-03-16 ENCOUNTER — Observation Stay (HOSPITAL_COMMUNITY)
Admission: EM | Admit: 2016-03-16 | Discharge: 2016-03-17 | Disposition: A | Discharge status: Home / Self Care | Payer: Managed Care, Other (non HMO) | Attending: Internal Medicine | Admitting: Internal Medicine

## 2016-03-16 ENCOUNTER — Emergency Department (HOSPITAL_COMMUNITY): Payer: Managed Care, Other (non HMO)

## 2016-03-16 DIAGNOSIS — Z87891 Personal history of nicotine dependence: Secondary | ICD-10-CM | POA: Diagnosis not present

## 2016-03-16 DIAGNOSIS — R071 Chest pain on breathing: Secondary | ICD-10-CM | POA: Diagnosis present

## 2016-03-16 DIAGNOSIS — Z7982 Long term (current) use of aspirin: Secondary | ICD-10-CM | POA: Diagnosis not present

## 2016-03-16 DIAGNOSIS — I4891 Unspecified atrial fibrillation: Secondary | ICD-10-CM | POA: Insufficient documentation

## 2016-03-16 DIAGNOSIS — I252 Old myocardial infarction: Secondary | ICD-10-CM | POA: Diagnosis not present

## 2016-03-16 DIAGNOSIS — Z79899 Other long term (current) drug therapy: Secondary | ICD-10-CM | POA: Diagnosis not present

## 2016-03-16 DIAGNOSIS — Z955 Presence of coronary angioplasty implant and graft: Secondary | ICD-10-CM | POA: Diagnosis not present

## 2016-03-16 DIAGNOSIS — I25110 Atherosclerotic heart disease of native coronary artery with unstable angina pectoris: Secondary | ICD-10-CM | POA: Diagnosis not present

## 2016-03-16 DIAGNOSIS — I10 Essential (primary) hypertension: Secondary | ICD-10-CM | POA: Insufficient documentation

## 2016-03-16 DIAGNOSIS — I200 Unstable angina: Secondary | ICD-10-CM

## 2016-03-16 DIAGNOSIS — R079 Chest pain, unspecified: Secondary | ICD-10-CM | POA: Diagnosis not present

## 2016-03-16 LAB — CBC
HCT: 43.2 % (ref 39.0–52.0)
Hemoglobin: 14.5 g/dL (ref 13.0–17.0)
MCH: 29.8 pg (ref 26.0–34.0)
MCHC: 33.6 g/dL (ref 30.0–36.0)
MCV: 88.9 fL (ref 78.0–100.0)
PLATELETS: 226 10*3/uL (ref 150–400)
RBC: 4.86 MIL/uL (ref 4.22–5.81)
RDW: 13.7 % (ref 11.5–15.5)
WBC: 7.3 10*3/uL (ref 4.0–10.5)

## 2016-03-16 LAB — BASIC METABOLIC PANEL
Anion gap: 7 (ref 5–15)
BUN: 19 mg/dL (ref 6–20)
CALCIUM: 9.3 mg/dL (ref 8.9–10.3)
CHLORIDE: 108 mmol/L (ref 101–111)
CO2: 24 mmol/L (ref 22–32)
CREATININE: 1.33 mg/dL — AB (ref 0.61–1.24)
GFR calc non Af Amer: 57 mL/min — ABNORMAL LOW (ref >60)
GLUCOSE: 92 mg/dL (ref 65–99)
Potassium: 4 mmol/L (ref 3.5–5.1)
Sodium: 139 mmol/L (ref 135–145)

## 2016-03-16 LAB — TROPONIN I: TROPONIN I: 0.06 ng/mL — AB (ref <0.03)

## 2016-03-16 MED ORDER — NITROGLYCERIN 0.4 MG SL SUBL
0.4000 mg | SUBLINGUAL_TABLET | SUBLINGUAL | Status: DC | PRN
  Administered 2016-03-16: 0.4 mg via SUBLINGUAL
  Filled 2016-03-16: qty 1

## 2016-03-16 MED ORDER — ASPIRIN 81 MG PO CHEW
243.0000 mg | CHEWABLE_TABLET | Freq: Once | ORAL | Status: AC
  Administered 2016-03-16: 243 mg via ORAL
  Filled 2016-03-16: qty 3

## 2016-03-16 NOTE — H&P (Signed)
Patient ID: Shawn Elliott MRN: [id number], DOB/AGE: 02-20-1955   Admit date: 03/16/2016   Primary Physician: Manfred Arch, MD Primary Cardiologist: Dr. Tresa Endo   Pt. Profile: 61 yo male w/ CAD (STEMI with DESx3 to RCA in 01/2016), HFrEF (40%) and HTN who presents with chest pain since last night on 3/8.   He reports having episodes of heart racing, and noticing his HR increased to 100-120s on his smartwatch/smartphone.   On 3/8, he drank more coffee and tea than usual and noticed his HR increase. Then experienced some chest pain. Came to ED for further evaluation.  Problem List  Past Medical History:  Diagnosis Date  . Coronary artery disease   . GERD (gastroesophageal reflux disease)   . High cholesterol   . History of kidney stones    "passed them" (01/17/2016)  . Hypertension   . PONV (postoperative nausea and vomiting)    "when I woke up from my hernia I was nauseated"  . STEMI (ST elevation myocardial infarction) (HCC) 01/12/2016    Past Surgical History:  Procedure Laterality Date  . CARDIAC CATHETERIZATION N/A 01/12/2016   Procedure: Left Heart Cath and Coronary Angiography;  Surgeon: Lennette Bihari, MD;  Location: [facility] INVASIVE CV LAB;  Service: Cardiovascular;  Laterality: N/A;  . CARDIAC CATHETERIZATION N/A 01/12/2016   Procedure: Coronary Stent Intervention;  Surgeon: Lennette Bihari, MD;  Location: [facility] INVASIVE CV LAB;  Service: Cardiovascular;  Laterality: N/A;  . CARDIAC CATHETERIZATION N/A 01/17/2016   Procedure: Coronary Stent Intervention;  Surgeon: Lennette Bihari, MD;  Location: [facility] INVASIVE CV LAB;  Service: Cardiovascular;  Laterality: N/A;  . CARDIAC CATHETERIZATION N/A 01/17/2016   Procedure: Coronary Balloon Angioplasty;  Surgeon: Lennette Bihari, MD;  Location: [facility] INVASIVE CV LAB;  Service: Cardiovascular;  Laterality: N/A;  . COLONOSCOPY  ~ 2008  . CORONARY ANGIOPLASTY WITH STENT PLACEMENT  01/17/2016   "3 01/12/2016; 3 01/17/2016"  . GANGLION CYST EXCISION  Right ~ 2016  . INGUINAL HERNIA REPAIR Right ~ 2014  . kidney stones  2010     Allergies  Allergies  Allergen Reactions  . Penicillins Hives    Childhood allergic reaction Has patient had a PCN reaction causing immediate rash, facial/tongue/throat swelling, SOB or lightheadedness with hypotension: Yes Has patient had a PCN reaction causing severe rash involving mucus membranes or skin necrosis: No Has patient had a PCN reaction that required hospitalization No Has patient had a PCN reaction occurring within the last 10 years: No If all of the above answers are "NO", then may proceed with Cephalosporin use.   Home Medications  Prior to Admission medications   Medication Sig Start Date End Date Taking? Authorizing Provider  aspirin EC 81 MG tablet Take 81 mg by mouth daily.   Yes Historical Provider, MD  atorvastatin (LIPITOR) 80 MG tablet Take 1 tablet (80 mg total) by mouth daily at 6 PM. Patient taking differently: Take 80 mg by mouth at bedtime.  02/06/16  Yes Almond Lint, MD  ibuprofen (ADVIL,MOTRIN) 200 MG tablet Take 400 mg by mouth every 6 (six) hours as needed (pain).   Yes Historical Provider, MD  isosorbide mononitrate (IMDUR) 30 MG 24 hr tablet Take 1 tablet (30 mg total) by mouth daily. 02/03/16  Yes Almond Lint, MD  lisinopril (PRINIVIL,ZESTRIL) 2.5 MG tablet Take 1 tablet (2.5 mg total) by mouth daily. 02/02/16  Yes Almond Lint, MD  metoprolol tartrate (LOPRESSOR) 25 MG tablet Take 0.5 tablets (12.5 mg total)  by mouth 2 (two) times daily. 02/02/16  Yes Almond Lint, MD  nitroGLYCERIN (NITROSTAT) 0.4 MG SL tablet Place 1 tablet (0.4 mg total) under the tongue every 5 (five) minutes as needed for chest pain. 02/02/16  Yes Almond Lint, MD  omeprazole (PRILOSEC) 20 MG capsule Take 20 mg by mouth daily. 12/04/15  Yes Historical Provider, MD  ticagrelor (BRILINTA) 90 MG TABS tablet Take 1 tablet (90 mg total) by mouth 2 (two) times daily. 02/02/16  Yes Almond Lint, MD  tizanidine  (ZANAFLEX) 2 MG capsule Take 2 mg by mouth daily as needed for muscle spasms.  03/22/15 03/21/16 Yes Historical Provider, MD    Family History  Family History  Problem Relation Age of Onset  . Heart attack Mother   . Diabetes Mother   . Stroke Mother   . Hypertension Mother   . Hyperlipidemia Mother   . Diabetes Father   . Stroke Father   . Hyperlipidemia Father   . Hypertension Father   . Hyperlipidemia Sister   . Hypertension Sister   . Hypertension Brother   . Hyperlipidemia Brother   . Diabetes Brother     Social History  Social History   Social History  . Marital status: Married    Spouse name: N/A  . Number of children: N/A  . Years of education: N/A   Occupational History  . Not on file.   Social History Main Topics  . Smoking status: Former Smoker    Types: Pipe    Quit date: 78  . Smokeless tobacco: Never Used  . Alcohol use 0.6 oz/week    1 Cans of beer per week  . Drug use: No  . Sexual activity: Not Currently   Other Topics Concern  . Not on file   Social History Narrative  . No narrative on file     Review of Systems General:  No chills, fever, night sweats or weight changes.  Cardiovascular:  +chest pain,no dyspnea on exertion, edema, orthopnea, palpitations, paroxysmal nocturnal dyspnea. Dermatological: No rash, lesions/masses Respiratory: No cough, dyspnea Urologic: No hematuria, dysuria Abdominal:   No nausea, vomiting, diarrhea, bright red blood per rectum, melena, or hematemesis Neurologic:  No visual changes, wkns, changes in mental status. All other systems reviewed and are otherwise negative except as noted above.  Physical Exam  Blood pressure 119/80, pulse 61, temperature 97.9 F (36.6 C), resp. rate 15, height 6\' 2"  (1.88 m), weight 104.3 kg (230 lb), SpO2 97 %.  General: Pleasant, NAD Psych: Normal affect. Neuro: Alert and oriented X 3. Moves all extremities spontaneously. HEENT: Normal  Neck: Supple without bruits or  JVD. Lungs:  Resp regular and unlabored, CTA. Heart: RRR no s3, s4, or murmurs. Abdomen: Soft, non-tender, non-distended, BS + x 4.  Extremities: No clubbing, cyanosis or edema. DP/PT/Radials 2+ and equal bilaterally.  Labs  Troponin (Point of Care Test) No results for input(s): TROPIPOC in the last 72 hours.  Recent Labs  03/16/16 1656  TROPONINI 0.06*   Lab Results  Component Value Date   WBC 7.3 03/16/2016   HGB 14.5 03/16/2016   HCT 43.2 03/16/2016   MCV 88.9 03/16/2016   PLT 226 03/16/2016    Recent Labs Lab 03/16/16 1656  NA 139  K 4.0  CL 108  CO2 24  BUN 19  CREATININE 1.33*  CALCIUM 9.3  GLUCOSE 92   Lab Results  Component Value Date   CHOL 266 (H) 01/12/2016   HDL 47 01/12/2016  LDLCALC 182 (H) 01/12/2016   TRIG 183 (H) 01/12/2016   No results found for: DDIMER   Radiology/Studies  Dg Chest 2 View  Result Date: 03/16/2016 CLINICAL DATA:  Chest pain since last evening with dyspnea and tachycardia EXAM: CHEST  2 VIEW COMPARISON:  None. FINDINGS: The heart size and mediastinal contours are within normal limits. There is mild upper lobe predominant hyperinflation of the lungs with crowding of lower lobe interstitial lung markings. No pneumonic consolidation, effusion or pneumothorax. Button artifacts are seen bilaterally. Degenerative change noted along the dorsal spine. IMPRESSION: No active cardiopulmonary disease. Mild emphysematous hyperinflation of the upper lobes bilaterally. Electronically Signed   By: Tollie Eth M.D.   On: 03/16/2016 17:42    ECG  Inferior T wave inversions, similar to post cath, sinus  Echocardiogram  01/14/2016 - Left ventricle: The cavity size was mildly dilated. There was   moderate concentric hypertrophy. Systolic function was mildly to   moderately reduced. The estimated ejection fraction was in the   range of 40% to 45%. Severe hypokinesis of the inferolateral and   inferior myocardium. Features are consistent with a  pseudonormal   left ventricular filling pattern, with concomitant abnormal   relaxation and increased filling pressure (grade 2 diastolic   dysfunction). No evidence of thrombus. - Aortic valve: Valve area (VTI): 4.34 cm^2. Valve area (Vmax):   4.65 cm^2. Valve area (Vmean): 4.23 cm^2. - Left atrium: The atrium was mildly dilated. - Right atrium: The atrium was mildly dilated. - Atrial septum: No defect or patent foramen ovale was identified. - Pericardium, extracardiac: A trivial pericardial effusion was   identified.   ====================== ASSESSMENT AND PLAN 61 yo male w/ CAD (STEMI with DESx3 to RCA in 01/2016), HFrEF (40%) and HTN who presents with chest pain since last night on 3/8.  # Chest pain: possibly related to afib 2/2 caffeine. No evidence of ECG changes. 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 CHADSVASC 3 (CHF, HTN, vascular) if indeed found to have afib.  - continue home medications  - telemetry - may need event monitor on discharge - we discussed using his smart watch and smartphone to help monitor his heart rate (discussed FDA-approved AliveCor afib app to detect afib) also - could also increase his Metop or Isosorbide to control his angina if needed  # CAD - continue ASA + ticag + statin - continue ace-I and BB and isosorbide  # HFrEF - continue ace-I  FULL CODE  Signed, Yehuda Savannah, MD

## 2016-03-16 NOTE — ED Notes (Signed)
Pt states he was not feeling well at work today. Pt reports last evening at midnight that he felt his heart racing. Pt states he could not get comfortable last evening. Pt states he felt a pain in his chest while at work. Pt states this pain was in the center of his chest.

## 2016-03-16 NOTE — ED Triage Notes (Signed)
The pt is c/o chest pain since last pm with sob and a rapid heart rate.  He still feels strange

## 2016-03-16 NOTE — ED Provider Notes (Signed)
[facility]-EMERGENCY DEPT Provider Note   CSN: [id number] Arrival date & time: 03/16/16  1631     History   Chief Complaint Chief Complaint  Patient presents with  . Chest Pain    HPI Shawn Elliott is a 61 y.o. male.  16-year-old man with past medical history coronary artery disease status post drug-eluting stent placed 01/28/2016, GERD, hyperlipidemia, hypertension, nephrolithiasis and diastolic dysfunction presents with chest pain. The chest pain began last night just as he was getting home from work. Pain is in the center of his chest it is a burning pain which she says feels like heartburn. He relates this pain to what he experienced on prior admission for STEMI. The chest pain is intermittent, while he was sitting in the ED waiting room this afternoon he noticed the pain radiated to his left chest. Last night he was experiencing palpitations and relates this to drinking a lot of caffeinated beverages yesterday. He has not found any relieving factors for the chest pain. The chest pain is not exacerbated by exertion, deep breathing or eating. He does report chest wall tenderness. He continued to have the chest pain this morning, palpitations resolved he took all of his medications including aspirin 81mg  and Brilinta and drove himself to the ED. He reports compliance with Brilinta and omeprazole.      Past Medical History:  Diagnosis Date  . Coronary artery disease   . GERD (gastroesophageal reflux disease)   . High cholesterol   . History of kidney stones    "passed them" (01/17/2016)  . Hypertension   . PONV (postoperative nausea and vomiting)    "when I woke up from my hernia I was nauseated"  . STEMI (ST elevation myocardial infarction) (HCC) 01/12/2016    Patient Active Problem List   Diagnosis Date Noted  . Status post coronary artery stent placement   . Obesity (BMI 30-39.9)   . Hyperlipidemia LDL goal <70   . Elevated liver function tests   . Acute ST elevation  myocardial infarction (STEMI) involving right coronary artery (HCC) 01/12/2016  . Coronary artery disease involving native coronary artery of native heart with unstable angina pectoris (HCC) 01/12/2016  . Atrial fibrillation with rapid ventricular response (HCC) 01/12/2016  . 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 Acute ST elevation myocardial infarction (STEMI) due to occlusion of right coronary artery (HCC)   . Coronary artery disease involving native coronary artery with unstable angina pectoris [facility])     Past Surgical History:  Procedure Laterality Date  . CARDIAC CATHETERIZATION N/A 01/12/2016   Procedure: Left Heart Cath and Coronary Angiography;  Surgeon: Lennette Bihari, MD;  Location: [facility] INVASIVE CV LAB;  Service: Cardiovascular;  Laterality: N/A;  . CARDIAC CATHETERIZATION N/A 01/12/2016   Procedure: Coronary Stent Intervention;  Surgeon: Lennette Bihari, MD;  Location: [facility] INVASIVE CV LAB;  Service: Cardiovascular;  Laterality: N/A;  . CARDIAC CATHETERIZATION N/A 01/17/2016   Procedure: Coronary Stent Intervention;  Surgeon: Lennette Bihari, MD;  Location: [facility] INVASIVE CV LAB;  Service: Cardiovascular;  Laterality: N/A;  . CARDIAC CATHETERIZATION N/A 01/17/2016   Procedure: Coronary Balloon Angioplasty;  Surgeon: Lennette Bihari, MD;  Location: [facility] INVASIVE CV LAB;  Service: Cardiovascular;  Laterality: N/A;  . COLONOSCOPY  ~ 2008  . CORONARY ANGIOPLASTY WITH STENT PLACEMENT  01/17/2016   "3 01/12/2016; 3 01/17/2016"  . GANGLION CYST EXCISION Right ~ 2016  . INGUINAL HERNIA REPAIR Right ~ 2014  . kidney stones  2010  Home Medications    Prior to Admission medications   Medication Sig Start Date End Date Taking? Authorizing Provider  aspirin EC 81 MG tablet Take 81 mg by mouth daily.    Historical Provider, MD  atorvastatin (LIPITOR) 80 MG tablet Take 1 tablet (80 mg total) by mouth daily at 6 PM. 02/06/16   Almond Lint, MD  isosorbide mononitrate (IMDUR) 30 MG 24 hr tablet Take 1  tablet (30 mg total) by mouth daily. 02/03/16   Almond Lint, MD  lisinopril (PRINIVIL,ZESTRIL) 2.5 MG tablet Take 1 tablet (2.5 mg total) by mouth daily. 02/02/16   Almond Lint, MD  metoprolol tartrate (LOPRESSOR) 25 MG tablet Take 0.5 tablets (12.5 mg total) by mouth 2 (two) times daily. 02/02/16   Almond Lint, MD  nitroGLYCERIN (NITROSTAT) 0.4 MG SL tablet Place 1 tablet (0.4 mg total) under the tongue every 5 (five) minutes as needed for chest pain. 02/02/16   Almond Lint, MD  omeprazole (PRILOSEC) 20 MG capsule Take 20 mg by mouth daily. 12/04/15   Historical Provider, MD  ticagrelor (BRILINTA) 90 MG TABS tablet Take 1 tablet (90 mg total) by mouth 2 (two) times daily. 02/02/16   Almond Lint, MD  tizanidine (ZANAFLEX) 2 MG capsule Take 2 mg by mouth 3 (three) times daily as needed for muscle spasms. 03/22/15 03/21/16  Historical Provider, MD    Family History Family History  Problem Relation Age of Onset  . Heart attack Mother   . Diabetes Mother   . Stroke Mother   . Hypertension Mother   . Hyperlipidemia Mother   . Diabetes Father   . Stroke Father   . Hyperlipidemia Father   . Hypertension Father   . Hyperlipidemia Sister   . Hypertension Sister   . Hypertension Brother   . Hyperlipidemia Brother   . Diabetes Brother     Social History Social History  Substance Use Topics  . Smoking status: Former Smoker    Types: Pipe    Quit date: 22  . Smokeless tobacco: Never Used  . Alcohol use 0.6 oz/week    1 Cans of beer per week     Allergies   Penicillins   Review of Systems Review of Systems  Constitutional: Negative for diaphoresis.  Respiratory: Negative for shortness of breath.   Cardiovascular: Positive for chest pain and palpitations.  Gastrointestinal: Negative for abdominal pain.  All other systems reviewed and are negative.    Physical Exam Updated Vital Signs BP 119/84   Pulse 62   Temp 97.9 F (36.6 C)   Resp 10   Ht 6\' 2"  (1.88 m)   Wt 104.3  kg   SpO2 98%   BMI 29.53 kg/m   Physical Exam  Constitutional: He is oriented to person, place, and time. He appears well-developed and well-nourished. No distress.  Nontoxic appearing man sitting comfortably in bed  HENT:  Head: Normocephalic and atraumatic.  Eyes: Conjunctivae are normal. No scleral icterus.  Neck: Normal range of motion.  Cardiovascular: Normal rate and regular rhythm.   No murmur heard. 1+ bilateral lower extremity pitting edema  Pulmonary/Chest: Effort normal. No respiratory distress. He has no wheezes. He has no rales.  Abdominal: Soft. He exhibits no distension. There is no tenderness. There is no guarding.  Neurological: He is alert and oriented to person, place, and time.  Skin: Skin is warm and dry. He is not diaphoretic.  Psychiatric: He has a normal mood and affect. His behavior is normal.  ED Treatments / Results  Labs (all labs ordered are listed, but only abnormal results are displayed) Labs Reviewed  BASIC METABOLIC PANEL - Abnormal; Notable for the following:       Result Value   Creatinine, Ser 1.33 (*)    GFR calc non Af Amer 57 (*)    All other components within normal limits  TROPONIN I - Abnormal; Notable for the following:    Troponin I 0.06 (*)    All other components within normal limits  CBC    EKG  EKG Interpretation  Date/Time:  Friday March 16 2016 16:35:31 EST Ventricular Rate:  66 PR Interval:  122 QRS Duration: 106 QT Interval:  448 QTC Calculation: 469 R Axis:   112 Text Interpretation:  Normal sinus rhythm Indeterminate axis Inferior infarct , age undetermined ST & T wave abnormality, consider lateral ischemia Abnormal ECG since last tracing no significant change Confirmed by BELFI  MD, MELANIE ([id number]) on 03/16/2016 6:34:34 PM       Radiology Dg Chest 2 View  Result Date: 03/16/2016 CLINICAL DATA:  Chest pain since last evening with dyspnea and tachycardia EXAM: CHEST  2 VIEW COMPARISON:  None. FINDINGS: The  heart size and mediastinal contours are within normal limits. There is mild upper lobe predominant hyperinflation of the lungs with crowding of lower lobe interstitial lung markings. No pneumonic consolidation, effusion or pneumothorax. Button artifacts are seen bilaterally. Degenerative change noted along the dorsal spine. IMPRESSION: No active cardiopulmonary disease. Mild emphysematous hyperinflation of the upper lobes bilaterally. Electronically Signed   By: Tollie Eth M.D.   On: 03/16/2016 17:42    Procedures Procedures (including critical care time)  Medications Ordered in ED Medications  nitroGLYCERIN (NITROSTAT) SL tablet 0.4 mg (not administered)  aspirin chewable tablet 243 mg (not administered)     Initial Impression / Assessment and Plan / ED Course  I have reviewed the triage vital signs and the nursing notes.  Pertinent labs & imaging results that were available during my care of the patient were reviewed by me and considered in my medical decision making (see chart for details).   11-year-old man with past medical history coronary artery disease status post drug-eluting stenting 01/2016, diastolic dysfunction, GERD, hyperlipidemia, hypertension, and nephrolithiasis presents with chest pain. Initial troponin was elevated 0.06, EKG showed ST segment changes throughout inferior, lateral and anterior leads unchanged from prior 01/2016. Chest xray revealed mild emphysematous hyperinflation. SL nitro and aspirin ordered. Cardiology consulted for elevated troponin and possible NSTEMI.    Final Clinical Impressions(s) / ED Diagnoses   Final diagnoses:  None    New Prescriptions New Prescriptions   No medications on file     Eulah Pont, MD 03/16/16 2001    Rolan Bucco, MD 03/16/16 2039

## 2016-03-17 LAB — LIPID PANEL
CHOL/HDL RATIO: 3.9 ratio
Cholesterol: 97 mg/dL (ref 0–200)
HDL: 25 mg/dL — ABNORMAL LOW (ref >40)
LDL Cholesterol: 50 mg/dL (ref 0–99)
Triglycerides: 112 mg/dL (ref <150)
VLDL: 22 mg/dL (ref 0–40)

## 2016-03-17 LAB — BASIC METABOLIC PANEL
Anion gap: 8 (ref 5–15)
BUN: 19 mg/dL (ref 6–20)
CHLORIDE: 105 mmol/L (ref 101–111)
CO2: 25 mmol/L (ref 22–32)
Calcium: 8.7 mg/dL — ABNORMAL LOW (ref 8.9–10.3)
Creatinine, Ser: 1.24 mg/dL (ref 0.61–1.24)
GFR calc Af Amer: >60 mL/min (ref >60)
GFR calc non Af Amer: >60 mL/min (ref >60)
GLUCOSE: 112 mg/dL — AB (ref 65–99)
Potassium: 3.4 mmol/L — ABNORMAL LOW (ref 3.5–5.1)
Sodium: 138 mmol/L (ref 135–145)

## 2016-03-17 LAB — TROPONIN I
TROPONIN I: 0.05 ng/mL — AB (ref <0.03)
Troponin I: 0.05 ng/mL (ref <0.03)

## 2016-03-17 LAB — CBC
HCT: 41 % (ref 39.0–52.0)
Hemoglobin: 13.5 g/dL (ref 13.0–17.0)
MCH: 29.2 pg (ref 26.0–34.0)
MCHC: 32.9 g/dL (ref 30.0–36.0)
MCV: 88.6 fL (ref 78.0–100.0)
Platelets: 178 10*3/uL (ref 150–400)
RBC: 4.63 MIL/uL (ref 4.22–5.81)
RDW: 13.5 % (ref 11.5–15.5)
WBC: 7.7 10*3/uL (ref 4.0–10.5)

## 2016-03-17 LAB — HIV ANTIBODY (ROUTINE TESTING W REFLEX): HIV SCREEN 4TH GENERATION: NONREACTIVE

## 2016-03-17 MED ORDER — TICAGRELOR 90 MG PO TABS
90.0000 mg | ORAL_TABLET | Freq: Two times a day (BID) | ORAL | Status: DC
  Administered 2016-03-17 (×2): 90 mg via ORAL
  Filled 2016-03-17 (×2): qty 1

## 2016-03-17 MED ORDER — ATORVASTATIN CALCIUM 80 MG PO TABS
80.0000 mg | ORAL_TABLET | Freq: Every day | ORAL | Status: DC
  Administered 2016-03-17: 80 mg via ORAL
  Filled 2016-03-17: qty 1

## 2016-03-17 MED ORDER — METOPROLOL TARTRATE 12.5 MG HALF TABLET
12.5000 mg | ORAL_TABLET | Freq: Two times a day (BID) | ORAL | Status: DC
  Administered 2016-03-17 (×2): 12.5 mg via ORAL
  Filled 2016-03-17 (×2): qty 1

## 2016-03-17 MED ORDER — ASPIRIN EC 81 MG PO TBEC
81.0000 mg | DELAYED_RELEASE_TABLET | Freq: Every day | ORAL | Status: DC
  Administered 2016-03-17: 81 mg via ORAL
  Filled 2016-03-17: qty 1

## 2016-03-17 MED ORDER — TIZANIDINE HCL 2 MG PO TABS
2.0000 mg | ORAL_TABLET | Freq: Every day | ORAL | Status: DC | PRN
Start: 2016-03-17 — End: 2016-03-17
  Filled 2016-03-17: qty 1

## 2016-03-17 MED ORDER — NITROGLYCERIN 0.4 MG SL SUBL: 0.4000 mg | SUBLINGUAL_TABLET | SUBLINGUAL | Status: DC | PRN

## 2016-03-17 MED ORDER — PANTOPRAZOLE SODIUM 40 MG PO TBEC
40.0000 mg | DELAYED_RELEASE_TABLET | Freq: Every day | ORAL | Status: DC
  Administered 2016-03-17: 40 mg via ORAL
  Filled 2016-03-17: qty 1

## 2016-03-17 MED ORDER — ISOSORBIDE MONONITRATE ER 30 MG PO TB24
30.0000 mg | ORAL_TABLET | Freq: Every day | ORAL | Status: DC
  Administered 2016-03-17: 30 mg via ORAL
  Filled 2016-03-17: qty 1

## 2016-03-17 MED ORDER — ONDANSETRON HCL 4 MG/2ML IJ SOLN: 4.0000 mg | Freq: Four times a day (QID) | INTRAMUSCULAR | Status: DC | PRN

## 2016-03-17 MED ORDER — LISINOPRIL 2.5 MG PO TABS
2.5000 mg | ORAL_TABLET | Freq: Every day | ORAL | Status: DC
  Administered 2016-03-17: 2.5 mg via ORAL
  Filled 2016-03-17: qty 1

## 2016-03-17 MED ORDER — ACETAMINOPHEN 325 MG PO TABS: 650.0000 mg | ORAL_TABLET | ORAL | Status: DC | PRN

## 2016-03-17 NOTE — Progress Notes (Signed)
Patient signed and left the hospital AMA despite being informed of risks. He verbalized understanding and stated that he will take full responsibility. PIV and tele monitor removed and patient walked out of the unit unaccompanied.Charge nurse Freddi Che informed NP Sharol Harness.

## 2016-03-17 NOTE — Progress Notes (Signed)
Patient requesting to be discharged ASAP otherwise leave the hospital AMA. NP Sharol Harness made aware.

## 2016-04-04 ENCOUNTER — Telehealth: Payer: Self-pay | Admitting: Cardiology

## 2016-04-04 NOTE — Telephone Encounter (Signed)
Returned call to patient. Recommended he could take Coricidin HBP or other cold medications that do not contain a decongestant. Advised for him to ask the pharmacist at the drug store for potential interactions with medications he is one while purchasing the product. He verbalized understanding.

## 2016-04-04 NOTE — Telephone Encounter (Signed)
Pt calling stating he has a cold and would like to know what kinds of over the counter stuff is he allowed and not allowed to take Please advise.

## 2016-04-20 ENCOUNTER — Other Ambulatory Visit (INDEPENDENT_AMBULATORY_CARE_PROVIDER_SITE_OTHER): Payer: Managed Care, Other (non HMO)

## 2016-04-20 DIAGNOSIS — I25110 Atherosclerotic heart disease of native coronary artery with unstable angina pectoris: Secondary | ICD-10-CM | POA: Diagnosis not present

## 2016-04-21 LAB — COMPREHENSIVE METABOLIC PANEL
A/G RATIO: 1.7 (ref 1.2–2.2)
ALT: 23 IU/L (ref 0–44)
AST: 20 IU/L (ref 0–40)
Albumin: 4 g/dL (ref 3.6–4.8)
Alkaline Phosphatase: 101 IU/L (ref 39–117)
BUN/Creatinine Ratio: 15 (ref 10–24)
BUN: 16 mg/dL (ref 8–27)
Bilirubin Total: 1.1 mg/dL (ref 0.0–1.2)
CALCIUM: 9.2 mg/dL (ref 8.6–10.2)
CO2: 24 mmol/L (ref 18–29)
CREATININE: 1.07 mg/dL (ref 0.76–1.27)
Chloride: 101 mmol/L (ref 96–106)
GFR calc Af Amer: 87 mL/min/{1.73_m2} (ref >59)
GFR, EST NON AFRICAN AMERICAN: 75 mL/min/{1.73_m2} (ref >59)
Globulin, Total: 2.4 g/dL (ref 1.5–4.5)
Glucose: 94 mg/dL (ref 65–99)
POTASSIUM: 4.1 mmol/L (ref 3.5–5.2)
Sodium: 140 mmol/L (ref 134–144)
Total Protein: 6.4 g/dL (ref 6.0–8.5)

## 2016-04-21 LAB — LIPID PANEL
CHOL/HDL RATIO: 3.3 ratio (ref 0.0–5.0)
Cholesterol, Total: 111 mg/dL (ref 100–199)
HDL: 34 mg/dL — ABNORMAL LOW (ref >39)
LDL CALC: 56 mg/dL (ref 0–99)
Triglycerides: 107 mg/dL (ref 0–149)
VLDL CHOLESTEROL CAL: 21 mg/dL (ref 5–40)

## 2016-06-27 ENCOUNTER — Encounter: Payer: Self-pay | Admitting: Emergency Medicine

## 2016-06-27 ENCOUNTER — Other Ambulatory Visit: Payer: Self-pay

## 2016-06-27 ENCOUNTER — Emergency Department
Admission: EM | Admit: 2016-06-27 | Discharge: 2016-06-27 | Disposition: A | Discharge status: Home / Self Care | Payer: Managed Care, Other (non HMO) | Attending: Emergency Medicine | Admitting: Emergency Medicine

## 2016-06-27 ENCOUNTER — Emergency Department: Payer: Managed Care, Other (non HMO)

## 2016-06-27 DIAGNOSIS — Z7982 Long term (current) use of aspirin: Secondary | ICD-10-CM | POA: Diagnosis not present

## 2016-06-27 DIAGNOSIS — I10 Essential (primary) hypertension: Secondary | ICD-10-CM | POA: Insufficient documentation

## 2016-06-27 DIAGNOSIS — Z791 Long term (current) use of non-steroidal anti-inflammatories (NSAID): Secondary | ICD-10-CM | POA: Diagnosis not present

## 2016-06-27 DIAGNOSIS — R0600 Dyspnea, unspecified: Secondary | ICD-10-CM | POA: Diagnosis not present

## 2016-06-27 DIAGNOSIS — Z87891 Personal history of nicotine dependence: Secondary | ICD-10-CM | POA: Insufficient documentation

## 2016-06-27 DIAGNOSIS — Z79899 Other long term (current) drug therapy: Secondary | ICD-10-CM | POA: Diagnosis not present

## 2016-06-27 DIAGNOSIS — I2510 Atherosclerotic heart disease of native coronary artery without angina pectoris: Secondary | ICD-10-CM | POA: Diagnosis not present

## 2016-06-27 DIAGNOSIS — J40 Bronchitis, not specified as acute or chronic: Secondary | ICD-10-CM

## 2016-06-27 DIAGNOSIS — J209 Acute bronchitis, unspecified: Secondary | ICD-10-CM | POA: Diagnosis not present

## 2016-06-27 DIAGNOSIS — R0602 Shortness of breath: Secondary | ICD-10-CM | POA: Diagnosis present

## 2016-06-27 LAB — COMPREHENSIVE METABOLIC PANEL
ALK PHOS: 110 U/L (ref 38–126)
ALT: 31 U/L (ref 17–63)
ANION GAP: 6 (ref 5–15)
AST: 30 U/L (ref 15–41)
Albumin: 3.9 g/dL (ref 3.5–5.0)
BUN: 32 mg/dL — ABNORMAL HIGH (ref 6–20)
CALCIUM: 9.2 mg/dL (ref 8.9–10.3)
CO2: 27 mmol/L (ref 22–32)
CREATININE: 1.24 mg/dL (ref 0.61–1.24)
Chloride: 106 mmol/L (ref 101–111)
GFR calc Af Amer: >60 mL/min (ref >60)
Glucose, Bld: 102 mg/dL — ABNORMAL HIGH (ref 65–99)
Potassium: 3.9 mmol/L (ref 3.5–5.1)
Sodium: 139 mmol/L (ref 135–145)
TOTAL PROTEIN: 6.8 g/dL (ref 6.5–8.1)
Total Bilirubin: 1.2 mg/dL (ref 0.3–1.2)

## 2016-06-27 LAB — CBC
HCT: 45.8 % (ref 40.0–52.0)
HEMOGLOBIN: 15.7 g/dL (ref 13.0–18.0)
MCH: 29.8 pg (ref 26.0–34.0)
MCHC: 34.4 g/dL (ref 32.0–36.0)
MCV: 86.6 fL (ref 80.0–100.0)
Platelets: 194 10*3/uL (ref 150–440)
RBC: 5.28 MIL/uL (ref 4.40–5.90)
RDW: 14.7 % — ABNORMAL HIGH (ref 11.5–14.5)
WBC: 11.4 10*3/uL — ABNORMAL HIGH (ref 3.8–10.6)

## 2016-06-27 LAB — TROPONIN I: Troponin I: 0.03 ng/mL (ref <0.03)

## 2016-06-27 LAB — BRAIN NATRIURETIC PEPTIDE: B NATRIURETIC PEPTIDE 5: 234 pg/mL — AB (ref 0.0–100.0)

## 2016-06-27 MED ORDER — PREDNISONE 20 MG PO TABS: 40.0000 mg | ORAL_TABLET | Freq: Every day | ORAL | 0 refills | Status: DC

## 2016-06-27 MED ORDER — ALBUTEROL SULFATE HFA 108 (90 BASE) MCG/ACT IN AERS: 2.0000 | INHALATION_SPRAY | RESPIRATORY_TRACT | 0 refills | Status: DC | PRN

## 2016-06-27 NOTE — ED Provider Notes (Signed)
[facility] Emergency Department Provider Note  ____________________________________________  Time seen: Approximately 6:43 AM  I have reviewed the triage vital signs and the nursing notes.   HISTORY  Chief Complaint Shortness of Breath    HPI FLAVIAN MCILWAIN is a 61 y.o. male who complains of shortness of breath for the past few days. Better with standing upright and walking. Worse when sitting down. Also accompanied by nonproductive cough. No fevers or chills. No chest pain. No other concerns. The shortness of breath is intermittent, mild to moderate intensity.     Past Medical History:  Diagnosis Date  . Coronary artery disease   . GERD (gastroesophageal reflux disease)   . High cholesterol   . History of kidney stones    "passed them" (01/17/2016)  . Hypertension   . PONV (postoperative nausea and vomiting)    "when I woke up from my hernia I was nauseated"  . STEMI (ST elevation myocardial infarction) (HCC) 01/12/2016     Patient Active Problem List   Diagnosis Date Noted  . Atrial fibrillation (HCC) 03/16/2016  . Status post coronary artery stent placement   . Obesity (BMI 30-39.9)   . Hyperlipidemia LDL goal <70   . Elevated liver function tests   . Acute ST elevation myocardial infarction (STEMI) involving right coronary artery (HCC) 01/12/2016  . Coronary artery disease involving native coronary artery of native heart with unstable angina pectoris (HCC) 01/12/2016  . Atrial fibrillation with rapid ventricular response (HCC) 01/12/2016  . 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 Acute ST elevation myocardial infarction (STEMI) due to occlusion of right coronary artery (HCC)   . Coronary artery disease involving native coronary artery with unstable angina pectoris [facility])      Past Surgical History:  Procedure Laterality Date  . CARDIAC CATHETERIZATION N/A 01/12/2016   Procedure: Left Heart Cath and Coronary Angiography;  Surgeon:  Lennette Bihari, MD;  Location: [facility] INVASIVE CV LAB;  Service: Cardiovascular;  Laterality: N/A;  . CARDIAC CATHETERIZATION N/A 01/12/2016   Procedure: Coronary Stent Intervention;  Surgeon: Lennette Bihari, MD;  Location: [facility] INVASIVE CV LAB;  Service: Cardiovascular;  Laterality: N/A;  . CARDIAC CATHETERIZATION N/A 01/17/2016   Procedure: Coronary Stent Intervention;  Surgeon: Lennette Bihari, MD;  Location: [facility] INVASIVE CV LAB;  Service: Cardiovascular;  Laterality: N/A;  . CARDIAC CATHETERIZATION N/A 01/17/2016   Procedure: Coronary Balloon Angioplasty;  Surgeon: Lennette Bihari, MD;  Location: [facility] INVASIVE CV LAB;  Service: Cardiovascular;  Laterality: N/A;  . COLONOSCOPY  ~ 2008  . CORONARY ANGIOPLASTY WITH STENT PLACEMENT  01/17/2016   "3 01/12/2016; 3 01/17/2016"  . GANGLION CYST EXCISION Right ~ 2016  . INGUINAL HERNIA REPAIR Right ~ 2014  . kidney stones  2010     Prior to Admission medications   Medication Sig Start Date End Date Taking? Authorizing Provider  aspirin EC 81 MG tablet Take 81 mg by mouth daily.   Yes [provider]  atorvastatin (LIPITOR) 80 MG tablet Take 1 tablet (80 mg total) by mouth daily at 6 PM. Patient taking differently: Take 80 mg by mouth at bedtime.  02/06/16  Yes Almond Lint, MD  ibuprofen (ADVIL,MOTRIN) 200 MG tablet Take 400 mg by mouth every 6 (six) hours as needed (pain).   Yes [provider]  isosorbide mononitrate (IMDUR) 30 MG 24 hr tablet Take 1 tablet (30 mg total) by mouth daily. 02/03/16  Yes Almond Lint, MD  lisinopril (PRINIVIL,ZESTRIL) 2.5 MG tablet  Take 1 tablet (2.5 mg total) by mouth daily. 02/02/16  Yes Almond Lint, MD  metoprolol tartrate (LOPRESSOR) 25 MG tablet Take 0.5 tablets (12.5 mg total) by mouth 2 (two) times daily. 02/02/16  Yes Almond Lint, MD  nitroGLYCERIN (NITROSTAT) 0.4 MG SL tablet Place 1 tablet (0.4 mg total) under the tongue every 5 (five) minutes as needed for chest pain. 02/02/16  Yes Almond Lint, MD   omeprazole (PRILOSEC) 20 MG capsule Take 20 mg by mouth daily. 12/04/15  Yes [provider]  ticagrelor (BRILINTA) 90 MG TABS tablet Take 1 tablet (90 mg total) by mouth 2 (two) times daily. 02/02/16  Yes Almond Lint, MD  albuterol (PROVENTIL HFA) 108 (90 Base) MCG/ACT inhaler Inhale 2 puffs into the lungs every 4 (four) hours as needed for wheezing or shortness of breath. 06/27/16   Sharman Cheek, MD  predniSONE (DELTASONE) 20 MG tablet Take 2 tablets (40 mg total) by mouth daily. 06/27/16   Sharman Cheek, MD     Allergies Penicillins   Family History  Problem Relation Age of Onset  . Heart attack Mother   . Diabetes Mother   . Stroke Mother   . Hypertension Mother   . Hyperlipidemia Mother   . Diabetes Father   . Stroke Father   . Hyperlipidemia Father   . Hypertension Father   . Hyperlipidemia Sister   . Hypertension Sister   . Hypertension Brother   . Hyperlipidemia Brother   . Diabetes Brother     Social History Social History  Substance Use Topics  . Smoking status: Former Smoker    Types: Pipe    Quit date: 63  . Smokeless tobacco: Never Used  . Alcohol use 0.6 oz/week    1 Cans of beer per week    Review of Systems  Constitutional:   No fever or chills.  ENT:   No sore throat. No rhinorrhea. Cardiovascular:   No chest pain or syncope. Respiratory:   Positive as above shortness of breath and nonproductive cough. Gastrointestinal:   Negative for abdominal pain, vomiting and diarrhea.  Musculoskeletal:   Negative for focal pain or swelling All other systems reviewed and are negative except as documented above in ROS and HPI.  ____________________________________________   PHYSICAL EXAM:  VITAL SIGNS: ED Triage Vitals  Enc Vitals Group     BP 06/27/16 0426 (!) 144/97     Pulse Rate 06/27/16 0426 70     Resp 06/27/16 0426 20     Temp 06/27/16 0426 97.5 F (36.4 C)     Temp Source 06/27/16 0426 Oral     SpO2 06/27/16 0426 97 %      Weight 06/27/16 0423 220 lb (99.8 kg)     Height 06/27/16 0423 6\' 2"  (1.88 m)     Head Circumference --      Peak Flow --      Pain Score 06/27/16 0608 0     Pain Loc --      Pain Edu? --      Excl. in GC? --     Vital signs reviewed, nursing assessments reviewed.   Constitutional:   Alert and oriented. Well appearing and in no distress. Eyes:   No scleral icterus.  EOMI. No nystagmus. No conjunctival pallor. PERRL. ENT   Head:   Normocephalic and atraumatic.   Nose:   No congestion/rhinnorhea.    Mouth/Throat:   MMM, no pharyngeal erythema. No peritonsillar mass.    Neck:   No  meningismus. Full ROM Hematological/Lymphatic/Immunilogical:   No cervical lymphadenopathy. Cardiovascular:   RRR. Symmetric bilateral radial and DP pulses.  No murmurs.  Respiratory:   Normal respiratory effort without tachypnea/retractions. Breath sounds are clear and equal bilaterally. Slight expiratory wheezing with FEV1 maneuver Gastrointestinal:   Soft and nontender. Non distended. There is no CVA tenderness.  No rebound, rigidity, or guarding. Genitourinary:   deferred Musculoskeletal:   Normal range of motion in all extremities. No joint effusions.  No lower extremity tenderness.  No edema. Neurologic:   Normal speech and language.  Motor grossly intact. No gross focal neurologic deficits are appreciated.  Skin:    Skin is warm, dry and intact. No rash noted.  No petechiae, purpura, or bullae.  ____________________________________________    LABS (pertinent positives/negatives) (all labs ordered are listed, but only abnormal results are displayed) Labs Reviewed  TROPONIN I - Abnormal; Notable for the following:       Result Value   Troponin I 0.03 (*)    All other components within normal limits  CBC - Abnormal; Notable for the following:    WBC 11.4 (*)    RDW 14.7 (*)    All other components within normal limits  COMPREHENSIVE METABOLIC PANEL - Abnormal; Notable for the  following:    Glucose, Bld 102 (*)    BUN 32 (*)    All other components within normal limits  BRAIN NATRIURETIC PEPTIDE - Abnormal; Notable for the following:    B Natriuretic Peptide 234.0 (*)    All other components within normal limits   ____________________________________________   EKG  Interpreted by me Sinus rhythm rate 68. Normal axis and intervals. Normal QRS ST segments and T waves.  ____________________________________________    RADIOLOGY  Dg Chest 2 View  Result Date: 06/27/2016 CLINICAL DATA:  Initial evaluation for acute shortness of breath. EXAM: CHEST  2 VIEW COMPARISON:  Prior radiograph from 03/16/2016. FINDINGS: Transverse heart size within normal limits. Mediastinal silhouette normal. Lungs normally inflated. Scattered peribronchial thickening with subtle interstitial prominence, suggesting mild pulmonary interstitial congestion. Underlying emphysematous changes. No pleural effusion. No consolidative airspace opacity. The no pneumothorax. No acute osseus abnormality. IMPRESSION: 1. Mild diffuse interstitial congestion without frank pulmonary edema. 2. Underlying emphysema. Electronically Signed   By: Rise Mu M.D.   On: 06/27/2016 04:59    ____________________________________________   PROCEDURES Procedures  ____________________________________________   INITIAL IMPRESSION / ASSESSMENT AND PLAN / ED COURSE  Pertinent labs & imaging results that were available during my care of the patient were reviewed by me and considered in my medical decision making (see chart for details).  Patient well appearing no acute distress, presents with shortness of breath consistent with acute bronchitis.Considering the patient's symptoms, medical history, and physical examination today, I have low suspicion for ACS, PE, TAD, pneumothorax, carditis, mediastinitis, pneumonia, CHF, or sepsis.  Prednisone and Proventil. Follow-up with primary care. Vital signs  unremarkable. Patient ambulatory with normal oxygenation.      ____________________________________________   FINAL CLINICAL IMPRESSION(S) / ED DIAGNOSES  Final diagnoses:  Dyspnea, unspecified type  Bronchitis      New Prescriptions   ALBUTEROL (PROVENTIL HFA) 108 (90 BASE) MCG/ACT INHALER    Inhale 2 puffs into the lungs every 4 (four) hours as needed for wheezing or shortness of breath.   PREDNISONE (DELTASONE) 20 MG TABLET    Take 2 tablets (40 mg total) by mouth daily.     Portions of this note were generated with dragon dictation software.  Dictation errors may occur despite best attempts at proofreading.    Sharman Cheek, MD 06/27/16 [phone]

## 2016-06-27 NOTE — ED Triage Notes (Signed)
Patient ambulatory to triage with steady gait, without difficulty or distress noted; pt reports SHOB few days, tonight worse with cough; denies any pain

## 2016-08-13 DIAGNOSIS — Z951 Presence of aortocoronary bypass graft: Secondary | ICD-10-CM | POA: Insufficient documentation

## 2016-08-31 ENCOUNTER — Ambulatory Visit: Payer: Managed Care, Other (non HMO) | Admitting: Cardiovascular Disease

## 2016-09-27 ENCOUNTER — Encounter: Payer: Self-pay | Admitting: *Deleted

## 2016-09-27 ENCOUNTER — Encounter: Payer: Managed Care, Other (non HMO) | Attending: Internal Medicine | Admitting: *Deleted

## 2016-09-27 VITALS — Ht 73.0 in | Wt 222.9 lb

## 2016-09-27 DIAGNOSIS — Z48812 Encounter for surgical aftercare following surgery on the circulatory system: Secondary | ICD-10-CM | POA: Diagnosis present

## 2016-09-27 DIAGNOSIS — I509 Heart failure, unspecified: Secondary | ICD-10-CM | POA: Insufficient documentation

## 2016-09-27 DIAGNOSIS — Z951 Presence of aortocoronary bypass graft: Secondary | ICD-10-CM | POA: Diagnosis not present

## 2016-09-27 NOTE — Progress Notes (Signed)
Daily Session Note  Patient Details  Name: Shawn Elliott MRN: [id number] Date of Birth: 1955/01/16 Referring Provider:     Cardiac Rehab from 09/27/2016 in [facility] Cardiac and Pulmonary Rehab  Referring Provider  Hen[place]      Encounter Date: 09/27/2016  Check In:     Session Check In - 09/27/16 1346      Check-In   Location [facility]-Cardiac & Pulmonary Rehab   Staff Present Renita Papa, RN Vickki Hearing, BA, ACSM CEP, Exercise Physiologist   Supervising physician immediately available to respond to emergencies See telemetry face sheet for immediately available ER MD   Medication changes reported     No   Fall or balance concerns reported    No   Warm-up and Cool-down Performed as group-led instruction   Resistance Training Performed Yes   VAD Patient? No     Pain Assessment   Currently in Pain? No/denies           Exercise Prescription Changes - 09/27/16 1400      Response to Exercise   Blood Pressure (Admit) 126/78   Blood Pressure (Exercise) 138/76   Blood Pressure (Exit) 134/70   Heart Rate (Admit) 66 bpm   Heart Rate (Exercise) 97 bpm   Heart Rate (Exit) 63 bpm   Oxygen Saturation (Admit) 97 %   Oxygen Saturation (Exit) 97 %   Rating of Perceived Exertion (Exercise) 9      History  Smoking Status  . Former Smoker  . Types: Pipe  . Quit date: 1982  Smokeless Tobacco  . Never Used    Goals Met:  Proper associated with RPD/PD & O2 Sat Exercise tolerated well Personal goals reviewed No report of cardiac concerns or symptoms Strength training completed today  Goals Unmet:  Not Applicable  Comments: Med Review Completed    Dr. Emily Filbert is Medical Director for [place] and [facility].

## 2016-09-27 NOTE — Patient Instructions (Signed)
Patient Instructions  Patient Details  Name: Shawn Elliott MRN: [id number] Date of Birth: [birth year] Referring Provider:  Vita Erm, MD  Below are the personal goals you chose as well as exercise and nutrition goals. Our goal is to help you keep on track towards obtaining and maintaining your goals. We will be discussing your progress on these goals with you throughout the program.  Initial Exercise Prescription:     Initial Exercise Prescription - 09/27/16 1400      Date of Initial Exercise RX and Referring Provider   Date 09/27/16   Referring Provider Henke     Treadmill   MPH 2.4   Grade 1.5   Minutes 15   METs 3.33     Recumbant Bike   Level 5   RPM 60   Watts 45   Minutes 15   METs 3.35     REL-XR   Level 3   Speed 50   Minutes 15   METs 3.3     T5 Nustep   Level 3   SPM 80   Minutes 15   METs 3.3     Prescription Details   Frequency (times per week) 3   Duration Progress to 45 minutes of aerobic exercise without signs/symptoms of physical distress     Intensity   THRR 40-80% of Max Heartrate 97-139   Ratings of Perceived Exertion 11-13   Perceived Dyspnea 0-4     Resistance Training   Training Prescription Yes   Weight 3   Reps 10-15      Exercise Goals: Frequency: Be able to perform aerobic exercise three times per week working toward 3-5 days per week.  Intensity: Work with a perceived exertion of 11 (fairly light) - 15 (hard) as tolerated. Follow your new exercise prescription and watch for changes in prescription as you progress with the program. Changes will be reviewed with you when they are made.  Duration: You should be able to do 30 minutes of continuous aerobic exercise in addition to a 5 minute warm-up and a 5 minute cool-down routine.  Nutrition Goals: Your personal nutrition goals will be established when you do your nutrition analysis with the dietician.  The following are nutrition guidelines to follow: Cholesterol <  200mg /day Sodium < 1500mg /day Fiber: Men over 50 yrs - 30 grams per day  Personal Goals:     Personal Goals and Risk Factors at Admission - 09/27/16 1403      Core Components/Risk Factors/Patient Goals on Admission   Heart Failure Yes   Intervention Provide a combined exercise and nutrition program that is supplemented with education, support and counseling about heart failure. Directed toward relieving symptoms such as shortness of breath, decreased exercise tolerance, and extremity edema.   Expected Outcomes Improve functional capacity of life;Short term: Attendance in program 2-3 days a week with increased exercise capacity. Reported lower sodium intake. Reported increased fruit and vegetable intake. Reports medication compliance.;Short term: Daily weights obtained and reported for increase. Utilizing diuretic protocols set by physician.;Long term: Adoption of self-care skills and reduction of barriers for early signs and symptoms recognition and intervention leading to self-care maintenance.   Hypertension Yes   Intervention Provide education on lifestyle modifcations including regular physical activity/exercise, weight management, moderate sodium restriction and increased consumption of fresh fruit, vegetables, and low fat dairy, alcohol moderation, and smoking cessation.;Monitor prescription use compliance.   Expected Outcomes Short Term: Continued assessment and intervention until BP is < 140/38mm HG in hypertensive participants. <  130/61mm HG in hypertensive participants with diabetes, heart failure or chronic kidney disease.;Long Term: Maintenance of blood pressure at goal levels.   Lipids Yes   Intervention Provide education and support for participant on nutrition & aerobic/resistive exercise along with prescribed medications to achieve LDL 70mg , HDL >40mg .   Expected Outcomes Short Term: Participant states understanding of desired cholesterol values and is compliant with medications  prescribed. Participant is following exercise prescription and nutrition guidelines.;Long Term: Cholesterol controlled with medications as prescribed, with individualized exercise RX and with personalized nutrition plan. Value goals: LDL < , HDL > 40 mg.      Tobacco Use Initial Evaluation: History  Smoking Status  . Former Smoker  . Types: Pipe  . Quit date: 1982  Smokeless Tobacco  . Never Used    Exercise Goals and Review:     Exercise Goals    Row Name 09/27/16 1422             Exercise Goals   Increase Physical Activity Yes       Intervention Provide advice, education, support and counseling about physical activity/exercise needs.;Develop an individualized exercise prescription for aerobic and resistive training based on initial evaluation findings, risk stratification, comorbidities and participant's personal goals.       Expected Outcomes Achievement of increased cardiorespiratory fitness and enhanced flexibility, muscular endurance and strength shown through measurements of functional capacity and personal statement of participant.       Increase Strength and Stamina Yes       Intervention Provide advice, education, support and counseling about physical activity/exercise needs.;Develop an individualized exercise prescription for aerobic and resistive training based on initial evaluation findings, risk stratification, comorbidities and participant's personal goals.       Expected Outcomes Achievement of increased cardiorespiratory fitness and enhanced flexibility, muscular endurance and strength shown through measurements of functional capacity and personal statement of participant.       Able to understand and use rate of perceived exertion (RPE) scale Yes       Intervention Provide education and explanation on how to use RPE scale       Expected Outcomes Short Term: Able to use RPE daily in rehab to express subjective intensity level;Long Term:  Able to use RPE to guide  intensity level when exercising independently       Able to understand and use Dyspnea scale Yes       Intervention Provide education and explanation on how to use Dyspnea scale       Expected Outcomes Short Term: Able to use Dyspnea scale daily in rehab to express subjective sense of shortness of breath during exertion;Long Term: Able to use Dyspnea scale to guide intensity level when exercising independently       Knowledge and understanding of Target Heart Rate Range (THRR) Yes       Intervention Provide education and explanation of THRR including how the numbers were predicted and where they are located for reference       Expected Outcomes Short Term: Able to state/look up THRR;Long Term: Able to use THRR to govern intensity when exercising independently;Short Term: Able to use daily as guideline for intensity in rehab       Able to check pulse independently Yes       Intervention Provide education and demonstration on how to check pulse in carotid and radial arteries.;Review the importance of being able to check your own pulse for safety during independent exercise  Expected Outcomes Short Term: Able to explain why pulse checking is important during independent exercise;Long Term: Able to check pulse independently and accurately       Understanding of Exercise Prescription Yes       Intervention Provide education, explanation, and written materials on patient's individual exercise prescription       Expected Outcomes Short Term: Able to explain program exercise prescription;Long Term: Able to explain home exercise prescription to exercise independently          Copy of goals given to participant.

## 2016-09-27 NOTE — Progress Notes (Deleted)
Daily Session Note  Patient Details  Name: Shawn Elliott MRN: [id number] Date of Birth: [birth year] Referring Provider:     Cardiac Rehab from 02/06/2016 in [facility] Cardiac and Pulmonary Rehab  Referring Provider  Wende Bushy MD      Encounter Date: 09/27/2016  Check In:     Session Check In - 09/27/16 1346      Check-In   Location [facility]-Cardiac & Pulmonary Rehab   Staff Present Renita Papa, RN Vickki Hearing, BA, ACSM CEP, Exercise Physiologist   Supervising physician immediately available to respond to emergencies See telemetry face sheet for immediately available ER MD   Medication changes reported     No   Fall or balance concerns reported    No   Warm-up and Cool-down Performed as group-led instruction   Resistance Training Performed Yes   VAD Patient? No     Pain Assessment   Currently in Pain? No/denies           Exercise Prescription Changes - 09/27/16 1400      Response to Exercise   Blood Pressure (Admit) 126/78   Blood Pressure (Exercise) 138/76   Blood Pressure (Exit) 134/70   Heart Rate (Admit) 66 bpm   Heart Rate (Exercise) 97 bpm   Heart Rate (Exit) 63 bpm   Oxygen Saturation (Admit) 97 %   Oxygen Saturation (Exit) 97 %   Rating of Perceived Exertion (Exercise) 9      History  Smoking Status  . Former Smoker  . Types: Pipe  . Quit date: 1982  Smokeless Tobacco  . Never Used    Goals Met:  Proper associated with RPD/PD & O2 Sat Exercise tolerated well No report of cardiac concerns or symptoms Strength training completed today  Goals Unmet:  Not Applicable  Comments: Med Review Completed    Dr. Emily Filbert is Medical Director for [place] and [facility].

## 2016-09-27 NOTE — Progress Notes (Signed)
Cardiac Individual Treatment Plan  Patient Details  Name: Shawn Elliott MRN: [id number] Date of Birth: March 17, 1955 Referring Provider:     Cardiac Rehab from 09/27/2016 in [facility] Cardiac and Pulmonary Rehab  Referring Provider  Henke      Initial Encounter Date:    Cardiac Rehab from 09/27/2016 in [facility] Cardiac and Pulmonary Rehab  Date  09/27/16  Referring Provider  Henke      Visit Diagnosis: S/P CABG x 4  Patient's Home Medications on Admission:  Current Outpatient Prescriptions:  .  amiodarone (PACERONE) 200 MG tablet, Take by mouth., Disp: , Rfl:  .  apixaban (ELIQUIS) 5 MG TABS tablet, Take by mouth., Disp: , Rfl:  .  aspirin EC 81 MG tablet, Take 81 mg by mouth daily., Disp: , Rfl:  .  atorvastatin (LIPITOR) 80 MG tablet, Take 1 tablet (80 mg total) by mouth daily at 6 PM. (Patient taking differently: Take 80 mg by mouth at bedtime. ), Disp: 90 tablet, Rfl: 3 .  isosorbide mononitrate (IMDUR) 30 MG 24 hr tablet, Take 1 tablet (30 mg total) by mouth daily., Disp: 90 tablet, Rfl: 3 .  lisinopril (PRINIVIL,ZESTRIL) 2.5 MG tablet, Take 1 tablet (2.5 mg total) by mouth daily., Disp: 90 tablet, Rfl: 3 .  metoprolol tartrate (LOPRESSOR) 25 MG tablet, Take 0.5 tablets (12.5 mg total) by mouth 2 (two) times daily., Disp: 90 tablet, Rfl: 3 .  omeprazole (PRILOSEC) 20 MG capsule, Take 20 mg by mouth daily., Disp: , Rfl:  .  ticagrelor (BRILINTA) 90 MG TABS tablet, Take 1 tablet (90 mg total) by mouth 2 (two) times daily., Disp: 180 tablet, Rfl: 3 .  albuterol (PROVENTIL HFA) 108 (90 Base) MCG/ACT inhaler, Inhale 2 puffs into the lungs every 4 (four) hours as needed for wheezing or shortness of breath. (Patient not taking: Reported on 09/27/2016), Disp: 1 Inhaler, Rfl: 0 .  ibuprofen (ADVIL,MOTRIN) 200 MG tablet, Take 400 mg by mouth every 6 (six) hours as needed (pain)., Disp: , Rfl:  .  nitroGLYCERIN (NITROSTAT) 0.4 MG SL tablet, Place 1 tablet (0.4 mg total) under the tongue every 5 (five)  minutes as needed for chest pain. (Patient not taking: Reported on 09/27/2016), Disp: 25 tablet, Rfl: 2 .  predniSONE (DELTASONE) 20 MG tablet, Take 2 tablets (40 mg total) by mouth daily. (Patient not taking: Reported on 09/27/2016), Disp: 8 tablet, Rfl: 0  Past Medical History: Past Medical History:  Diagnosis Date  . Coronary artery disease   . GERD (gastroesophageal reflux disease)   . High cholesterol   . History of kidney stones    "passed them" (01/17/2016)  . Hypertension   . PONV (postoperative nausea and vomiting)    "when I woke up from my hernia I was nauseated"  . STEMI (ST elevation myocardial infarction) (HCC) 01/12/2016    Tobacco Use: History  Smoking Status  . Former Smoker  . Types: Pipe  . Quit date: 1982  Smokeless Tobacco  . Never Used    Labs: Recent Review Flowsheet Data    Labs for ITP Cardiac and Pulmonary Rehab Latest Ref Rng & Units 01/12/2016 03/17/2016 04/20/2016   Cholestrol 100 - 199 mg/dL 409(W) 97 119   LDLCALC 0 - 99 mg/dL 147(W) 50 56   HDL >29 mg/dL 47 56(O) 13(Y)   Trlycerides 0 - 149 mg/dL 865(H) 846 962   Hemoglobin A1c 4.8 - 5.6 % 5.4 - -   TCO2 0 - 100 mmol/L 21 - -  Exercise Target Goals: Date: 09/27/16  Exercise Program Goal: Individual exercise prescription set with THRR, safety & activity barriers. Participant demonstrates ability to understand and report RPE using BORG scale, to self-measure pulse accurately, and to acknowledge the importance of the exercise prescription.  Exercise Prescription Goal: Starting with aerobic activity 30 plus minutes a day, 3 days per week for initial exercise prescription. Provide home exercise prescription and guidelines that participant acknowledges understanding prior to discharge.  Activity Barriers & Risk Stratification:     Activity Barriers & Cardiac Risk Stratification - 09/27/16 1426      Activity Barriers & Cardiac Risk Stratification   Activity Barriers Shortness of Breath   Still gets short of breath when bending over or with certain activities   Cardiac Risk Stratification High      6 Minute Walk:     6 Minute Walk    Row Name 09/27/16 1422         6 Minute Walk   Distance 1300 feet     Walk Time 6 minutes     # of Rest Breaks 0     MPH 2.46     METS 3.42     RPE 9     Perceived Dyspnea  1     VO2 Peak 11.99     Symptoms No     Resting HR 56 bpm     Resting BP 126/78     Resting Oxygen Saturation  97 %     Exercise Oxygen Saturation  during 6 min walk 97 %     Max Ex. HR 97 bpm     Max Ex. BP 138/76     2 Minute Post BP 134/70        Oxygen Initial Assessment:   Oxygen Re-Evaluation:   Oxygen Discharge (Final Oxygen Re-Evaluation):   Initial Exercise Prescription:     Initial Exercise Prescription - 09/27/16 1400      Date of Initial Exercise RX and Referring Provider   Date 09/27/16   Referring Provider Henke     Treadmill   MPH 2.4   Grade 1.5   Minutes 15   METs 3.33     Recumbant Bike   Level 5   RPM 60   Watts 45   Minutes 15   METs 3.35     REL-XR   Level 3   Speed 50   Minutes 15   METs 3.3     T5 Nustep   Level 3   SPM 80   Minutes 15   METs 3.3     Prescription Details   Frequency (times per week) 3   Duration Progress to 45 minutes of aerobic exercise without signs/symptoms of physical distress     Intensity   THRR 40-80% of Max Heartrate 97-139   Ratings of Perceived Exertion 11-13   Perceived Dyspnea 0-4     Resistance Training   Training Prescription Yes   Weight 3   Reps 10-15      Perform Capillary Blood Glucose checks as needed.  Exercise Prescription Changes:     Exercise Prescription Changes    Row Name 09/27/16 1400             Response to Exercise   Blood Pressure (Admit) 126/78       Blood Pressure (Exercise) 138/76       Blood Pressure (Exit) 134/70       Heart Rate (Admit) 66 bpm  Heart Rate (Exercise) 97 bpm       Heart Rate (Exit) 63 bpm        Oxygen Saturation (Admit) 97 %       Oxygen Saturation (Exit) 97 %       Rating of Perceived Exertion (Exercise) 9          Exercise Comments:   Exercise Goals and Review:     Exercise Goals    Row Name 09/27/16 1422             Exercise Goals   Increase Physical Activity Yes       Intervention Provide advice, education, support and counseling about physical activity/exercise needs.;Develop an individualized exercise prescription for aerobic and resistive training based on initial evaluation findings, risk stratification, comorbidities and participant's personal goals.       Expected Outcomes Achievement of increased cardiorespiratory fitness and enhanced flexibility, muscular endurance and strength shown through measurements of functional capacity and personal statement of participant.       Increase Strength and Stamina Yes       Intervention Provide advice, education, support and counseling about physical activity/exercise needs.;Develop an individualized exercise prescription for aerobic and resistive training based on initial evaluation findings, risk stratification, comorbidities and participant's personal goals.       Expected Outcomes Achievement of increased cardiorespiratory fitness and enhanced flexibility, muscular endurance and strength shown through measurements of functional capacity and personal statement of participant.       Able to understand and use rate of perceived exertion (RPE) scale Yes       Intervention Provide education and explanation on how to use RPE scale       Expected Outcomes Short Term: Able to use RPE daily in rehab to express subjective intensity level;Long Term:  Able to use RPE to guide intensity level when exercising independently       Able to understand and use Dyspnea scale Yes       Intervention Provide education and explanation on how to use Dyspnea scale       Expected Outcomes Short Term: Able to use Dyspnea scale daily in rehab to  express subjective sense of shortness of breath during exertion;Long Term: Able to use Dyspnea scale to guide intensity level when exercising independently       Knowledge and understanding of Target Heart Rate Range (THRR) Yes       Intervention Provide education and explanation of THRR including how the numbers were predicted and where they are located for reference       Expected Outcomes Short Term: Able to state/look up THRR;Long Term: Able to use THRR to govern intensity when exercising independently;Short Term: Able to use daily as guideline for intensity in rehab       Able to check pulse independently Yes       Intervention Provide education and demonstration on how to check pulse in carotid and radial arteries.;Review the importance of being able to check your own pulse for safety during independent exercise       Expected Outcomes Short Term: Able to explain why pulse checking is important during independent exercise;Long Term: Able to check pulse independently and accurately       Understanding of Exercise Prescription Yes       Intervention Provide education, explanation, and written materials on patient's individual exercise prescription       Expected Outcomes Short Term: Able to explain program exercise prescription;Long Term: Able to explain home exercise  prescription to exercise independently          Exercise Goals Re-Evaluation :   Discharge Exercise Prescription (Final Exercise Prescription Changes):     Exercise Prescription Changes - 09/27/16 1400      Response to Exercise   Blood Pressure (Admit) 126/78   Blood Pressure (Exercise) 138/76   Blood Pressure (Exit) 134/70   Heart Rate (Admit) 66 bpm   Heart Rate (Exercise) 97 bpm   Heart Rate (Exit) 63 bpm   Oxygen Saturation (Admit) 97 %   Oxygen Saturation (Exit) 97 %   Rating of Perceived Exertion (Exercise) 9      Nutrition:  Target Goals: Understanding of nutrition guidelines, daily intake of sodium  1500mg , cholesterol 200mg , calories 30% from fat and 7% or less from saturated fats, daily to have 5 or more servings of fruits and vegetables.  Biometrics:     Pre Biometrics - 09/27/16 1413      Pre Biometrics   Height  (1.854 m)   Weight 222 lb 14.4 oz (101.1 kg)   Waist Circumference 42 inches   Hip Circumference 42.5 inches   Waist to Hip Ratio 0.99 %   BMI (Calculated) 29.41   Single Leg Stand 16.78 seconds       Nutrition Therapy Plan and Nutrition Goals:   Nutrition Discharge: Rate Your Plate Scores:     Nutrition Assessments - 09/27/16 1413      MEDFICTS Scores   Pre Score 24      Nutrition Goals Re-Evaluation:   Nutrition Goals Discharge (Final Nutrition Goals Re-Evaluation):   Psychosocial: Target Goals: Acknowledge presence or absence of significant depression and/or stress, maximize coping skills, provide positive support system. Participant is able to verbalize types and ability to use techniques and skills needed for reducing stress and depression.   Initial Review & Psychosocial Screening:     Initial Psych Review & Screening - 09/27/16 1417      Initial Review   Current issues with Current Sleep Concerns  Ed reports having trouble staying asleep. What he is currently doing is staying up super late until he can't hold his eyes open then he is actually being able to sleep until monring     Family Dynamics   Good Support System? Yes  Wife, family, friends      Screening Interventions   Interventions Yes;Encouraged to exercise;Program counselor consult   Expected Outcomes Short Term goal: Utilizing psychosocial counselor, staff and physician to assist with identification of specific Stressors or current issues interfering with healing process. Setting desired goal for each stressor or current issue identified.;Long Term Goal: Stressors or current issues are controlled or eliminated.;Short Term goal: Identification and review with participant  of any Quality of Life or Depression concerns found by scoring the questionnaire.;Long Term goal: The participant improves quality of Life and PHQ9 Scores as seen by post scores and/or verbalization of changes      Quality of Life Scores:      Quality of Life - 09/27/16 1419      Quality of Life Scores   Health/Function Pre 20.86 %   Socioeconomic Pre 20.63 %   Psych/Spiritual Pre 20.71 %   Family Pre 21 %   GLOBAL Pre 20.79 %      PHQ-9: Recent Review Flowsheet Data    Depression screen [facility] 2/9 09/27/2016 02/06/2016   Decreased Interest 0 0   Down, Depressed, Hopeless 0 0   PHQ - 2 Score 0 0  Altered sleeping 1 0   Tired, decreased energy 1 0   Change in appetite 0 0   Feeling bad or failure about yourself  0 0   Trouble concentrating 0 0   Moving slowly or fidgety/restless 0 0   Suicidal thoughts 0 0   PHQ-9 Score 2 0   Difficult doing work/chores Not difficult at all Not difficult at all     Interpretation of Total Score  Total Score Depression Severity:  1-4 = Minimal depression, 5-9 = Mild depression, 10-14 = Moderate depression, 15-19 = Moderately severe depression, 20-27 = Severe depression   Psychosocial Evaluation and Intervention:   Psychosocial Re-Evaluation:   Psychosocial Discharge (Final Psychosocial Re-Evaluation):   Vocational Rehabilitation: Provide vocational rehab assistance to qualifying candidates.   Vocational Rehab Evaluation & Intervention:     Vocational Rehab - 09/27/16 1424      Initial Vocational Rehab Evaluation & Intervention   Assessment shows need for Vocational Rehabilitation No      Education: Education Goals: Education classes will be provided on a variety of topics geared toward better understanding of heart health and risk factor modification. Participant will state understanding/return demonstration of topics presented as noted by education test scores.  Learning Barriers/Preferences:     Learning  Barriers/Preferences - 09/27/16 1420      Learning Barriers/Preferences   Learning Barriers None   Learning Preferences None      Education Topics: General Nutrition Guidelines/Fats and Fiber: -Group instruction provided by verbal, written material, models and posters to present the general guidelines for heart healthy nutrition. Gives an explanation and review of dietary fats and fiber.   Controlling Sodium/Reading Food Labels: -Group verbal and written material supporting the discussion of sodium use in heart healthy nutrition. Review and explanation with models, verbal and written materials for utilization of the food label.   Exercise Physiology & Risk Factors: - Group verbal and written instruction with models to review the exercise physiology of the cardiovascular system and associated critical values. Details cardiovascular disease risk factors and the goals associated with each risk factor.   Aerobic Exercise & Resistance Training: - Gives group verbal and written discussion on the health impact of inactivity. On the components of aerobic and resistive training programs and the benefits of this training and how to safely progress through these programs.   Flexibility, Balance, General Exercise Guidelines: - Provides group verbal and written instruction on the benefits of flexibility and balance training programs. Provides general exercise guidelines with specific guidelines to those with heart or lung disease. Demonstration and skill practice provided.   Stress Management: - Provides group verbal and written instruction about the health risks of elevated stress, cause of high stress, and healthy ways to reduce stress.   Cardiac Rehab from 02/29/2016 in [facility] Cardiac and Pulmonary Rehab  Date  02/15/16  Educator  [facility]  Instruction Review Code (retired)  2- meets goals/outcomes      Depression: - Provides group verbal and written instruction on the correlation between  heart/lung disease and depressed mood, treatment options, and the stigmas associated with seeking treatment.   Anatomy & Physiology of the Heart: - Group verbal and written instruction and models provide basic cardiac anatomy and physiology, with the coronary electrical and arterial systems. Review of: AMI, Angina, Valve disease, Heart Failure, Cardiac Arrhythmia, Pacemakers, and the ICD.   Cardiac Procedures: - Group verbal and written instruction to review commonly prescribed medications for heart disease. Reviews the medication, class of the drug,  and side effects. Includes the steps to properly store meds and maintain the prescription regimen. (beta blockers and nitrates)   Cardiac Rehab from 02/29/2016 in [facility] Cardiac and Pulmonary Rehab  Date  02/20/16  Educator  CE  Instruction Review Code (retired)  2- meets goals/outcomes      Cardiac Medications I: - Group verbal and written instruction to review commonly prescribed medications for heart disease. Reviews the medication, class of the drug, and side effects. Includes the steps to properly store meds and maintain the prescription regimen.   Cardiac Rehab from 02/29/2016 in [facility] Cardiac and Pulmonary Rehab  Date  02/27/16 [02/29/16 Second Part]  Educator  CE  Instruction Review Code (retired)  2- meets goals/outcomes      Cardiac Medications II: -Group verbal and written instruction to review commonly prescribed medications for heart disease. Reviews the medication, class of the drug, and side effects. (all other drug classes)    Go Sex-Intimacy & Heart Disease, Get SMART - Goal Setting: - Group verbal and written instruction through game format to discuss heart disease and the return to sexual intimacy. Provides group verbal and written material to discuss and apply goal setting through the application of the S.M.A.R.T. Method.   Cardiac Rehab from 02/29/2016 in [facility] Cardiac and Pulmonary Rehab  Date  02/20/16  Educator  CE   Instruction Review Code (retired)  2- meets goals/outcomes      Other Matters of the Heart: - Provides group verbal, written materials and models to describe Heart Failure, Angina, Valve Disease, Peripheral Artery Disease, and Diabetes in the realm of heart disease. Includes description of the disease process and treatment options available to the cardiac patient.   Exercise & Equipment Safety: - Individual verbal instruction and demonstration of equipment use and safety with use of the equipment.   Cardiac Rehab from 09/27/2016 in [facility] Cardiac and Pulmonary Rehab  Date  09/27/16  Educator  [facility]  Instruction Review Code  1- Verbalizes Understanding      Infection Prevention: - Provides verbal and written material to individual with discussion of infection control including proper hand washing and proper equipment cleaning during exercise session.   Cardiac Rehab from 09/27/2016 in [facility] Cardiac and Pulmonary Rehab  Date  09/27/16  Educator  [facility]  Instruction Review Code  1- Verbalizes Understanding      Falls Prevention: - Provides verbal and written material to individual with discussion of falls prevention and safety.   Cardiac Rehab from 09/27/2016 in [facility] Cardiac and Pulmonary Rehab  Date  09/27/16  Educator  [facility]  Instruction Review Code  1- Verbalizes Understanding      Diabetes: - Individual verbal and written instruction to review signs/symptoms of diabetes, desired ranges of glucose level fasting, after meals and with exercise. Acknowledge that pre and post exercise glucose checks will be done for 3 sessions at entry of program.   Other: -Provides group and verbal instruction on various topics (see comments)    Knowledge Questionnaire Score:     Knowledge Questionnaire Score - 09/27/16 1421      Knowledge Questionnaire Score   Pre Score 27/28  Correct answers reviewed with Ed      Core Components/Risk Factors/Patient Goals at Admission:     Personal Goals  and Risk Factors at Admission - 09/27/16 1403      Core Components/Risk Factors/Patient Goals on Admission   Heart Failure Yes   Intervention Provide a combined exercise and nutrition program that is supplemented with education,  support and counseling about heart failure. Directed toward relieving symptoms such as shortness of breath, decreased exercise tolerance, and extremity edema.   Expected Outcomes Improve functional capacity of life;Short term: Attendance in program 2-3 days a week with increased exercise capacity. Reported lower sodium intake. Reported increased fruit and vegetable intake. Reports medication compliance.;Short term: Daily weights obtained and reported for increase. Utilizing diuretic protocols set by physician.;Long term: Adoption of self-care skills and reduction of barriers for early signs and symptoms recognition and intervention leading to self-care maintenance.   Hypertension Yes   Intervention Provide education on lifestyle modifcations including regular physical activity/exercise, weight management, moderate sodium restriction and increased consumption of fresh fruit, vegetables, and low fat dairy, alcohol moderation, and smoking cessation.;Monitor prescription use compliance.   Expected Outcomes Short Term: Continued assessment and intervention until BP is < 140/65mm HG in hypertensive participants. < 130/6mm HG in hypertensive participants with diabetes, heart failure or chronic kidney disease.;Long Term: Maintenance of blood pressure at goal levels.   Lipids Yes   Intervention Provide education and support for participant on nutrition & aerobic/resistive exercise along with prescribed medications to achieve LDL 70mg , HDL >40mg .   Expected Outcomes Short Term: Participant states understanding of desired cholesterol values and is compliant with medications prescribed. Participant is following exercise prescription and nutrition guidelines.;Long Term: Cholesterol  controlled with medications as prescribed, with individualized exercise RX and with personalized nutrition plan. Value goals: LDL < , HDL > 40 mg.      Core Components/Risk Factors/Patient Goals Review:    Core Components/Risk Factors/Patient Goals at Discharge (Final Review):    ITP Comments:     ITP Comments    Row Name 09/27/16 1350           ITP Comments Med Review completed. Initial ITP created. Diagnosis can be found in Care Everywhere Office Visit 8/28          Comments: Initial ITP

## 2016-10-01 ENCOUNTER — Encounter: Payer: Managed Care, Other (non HMO) | Admitting: *Deleted

## 2016-10-01 DIAGNOSIS — Z951 Presence of aortocoronary bypass graft: Secondary | ICD-10-CM

## 2016-10-01 DIAGNOSIS — Z48812 Encounter for surgical aftercare following surgery on the circulatory system: Secondary | ICD-10-CM | POA: Diagnosis not present

## 2016-10-01 NOTE — Progress Notes (Signed)
Daily Session Note  Patient Details  Name: Shawn Elliott MRN: [id number] Date of Birth: [birth year] Referring Provider:     Cardiac Rehab from 09/27/2016 in [facility] Cardiac and Pulmonary Rehab  Referring Provider  Henke      Encounter Date: 10/01/2016  Check In:     Session Check In - 10/01/16 0805      Check-In   Location [facility]-Cardiac & Pulmonary Rehab   Staff Present Gerlene Burdock, RN, Levie Heritage, MA, ACSM RCEP, Exercise Physiologist;Kelly Amedeo Plenty, BS, ACSM CEP, Exercise Physiologist   Supervising physician immediately available to respond to emergencies See telemetry face sheet for immediately available ER MD   Medication changes reported     No   Fall or balance concerns reported    No   Warm-up and Cool-down Performed on first and last piece of equipment   Resistance Training Performed Yes   VAD Patient? No     Pain Assessment   Currently in Pain? No/denies   Multiple Pain Sites No         History  Smoking Status  . Former Smoker  . Types: Pipe  . Quit date: 1982  Smokeless Tobacco  . Never Used    Goals Met:  Exercise tolerated well Personal goals reviewed No report of cardiac concerns or symptoms Strength training completed today  Goals Unmet:  Not Applicable  Comments: First full day of exercise!  Patient was oriented to gym and equipment including functions, settings, policies, and procedures.  Patient's individual exercise prescription and treatment plan were reviewed.  All starting workloads were established based on the results of the 6 minute walk test done at initial orientation visit.  The plan for exercise progression was also introduced and progression will be customized based on patient's performance and goals.    Dr. Emily Filbert is Medical Director for [facility].

## 2016-10-03 DIAGNOSIS — Z951 Presence of aortocoronary bypass graft: Secondary | ICD-10-CM

## 2016-10-03 DIAGNOSIS — Z48812 Encounter for surgical aftercare following surgery on the circulatory system: Secondary | ICD-10-CM | POA: Diagnosis not present

## 2016-10-03 NOTE — Progress Notes (Signed)
Daily Session Note  Patient Details  Name: RAYNELL SCOTT MRN: [id number] Date of Birth: 19-Nov-1955 Referring Provider:     Cardiac Rehab from 09/27/2016 in [facility] Cardiac and Pulmonary Rehab  Referring Provider  [place]      Encounter Date: 10/03/2016  Check In:     Session Check In - 10/03/16 0807      Check-In   Location [facility]-Cardiac & Pulmonary Rehab   Staff Present Alberteen Sam, MA, ACSM RCEP, Exercise Physiologist;Susanne Bice, RN, BSN, CCRP;Revel Stellmach Flavia Shipper   Supervising physician immediately available to respond to emergencies See telemetry face sheet for immediately available ER MD   Medication changes reported     No   Fall or balance concerns reported    No   Warm-up and Cool-down Performed on first and last piece of equipment   Resistance Training Performed Yes   VAD Patient? No     Pain Assessment   Currently in Pain? No/denies   Multiple Pain Sites No         History  Smoking Status  . Former Smoker  . Types: Pipe  . Quit date: 1982  Smokeless Tobacco  . Never Used    Goals Met:  Independence with exercise equipment Exercise tolerated well No report of cardiac concerns or symptoms Strength training completed today  Goals Unmet:  Not Applicable  Comments: Pt able to follow exercise prescription today without complaint.  Will continue to monitor for progression.   Dr. Emily Filbert is Medical Director for [place] and [facility].

## 2016-10-05 ENCOUNTER — Encounter: Payer: Self-pay | Admitting: *Deleted

## 2016-10-05 ENCOUNTER — Encounter: Payer: Managed Care, Other (non HMO) | Admitting: *Deleted

## 2016-10-05 DIAGNOSIS — I213 ST elevation (STEMI) myocardial infarction of unspecified site: Secondary | ICD-10-CM

## 2016-10-05 DIAGNOSIS — Z48812 Encounter for surgical aftercare following surgery on the circulatory system: Secondary | ICD-10-CM | POA: Diagnosis not present

## 2016-10-05 DIAGNOSIS — Z951 Presence of aortocoronary bypass graft: Secondary | ICD-10-CM

## 2016-10-05 DIAGNOSIS — Z955 Presence of coronary angioplasty implant and graft: Secondary | ICD-10-CM

## 2016-10-05 NOTE — Progress Notes (Signed)
Daily Session Note  Patient Details  Name: Shawn Elliott MRN: [id number] Date of Birth: 08-02-1955 Referring Provider:     Cardiac Rehab from 09/27/2016 in [facility] Cardiac [facility] Rehab  Referring Provider  [place]      Encounter Date: 10/05/2016  Check In:     Session Check In - 10/05/16 0806      Check-In   Location [facility]-Cardiac & Pulmonary Rehab   Staff Present Darel Hong, RN BSN;Carroll Enterkin, RN, Vickki Hearing, BA, ACSM CEP, Exercise Physiologist;Other  Joellyn Rued, BS EP   Supervising physician immediately available to respond to emergencies See telemetry face sheet for immediately available ER MD   Medication changes reported     Yes   Fall or balance concerns reported    No   Tobacco Cessation No Change   Warm-up and Cool-down Performed on first and last piece of equipment   Resistance Training Performed Yes   VAD Patient? No     Pain Assessment   Currently in Pain? No/denies   Multiple Pain Sites No           Exercise Prescription Changes - 10/05/16 0900      Home Exercise Plan   Plans to continue exercise at Home (comment)  walking   Frequency Add 3 additional days to program exercise sessions.  already walks 30 min per day most days   Initial Home Exercises Provided 10/05/16      History  Smoking Status  . Former Smoker  . Types: Pipe  . Quit date: 1982  Smokeless Tobacco  . Never Used    Goals Met:  Independence with exercise equipment Exercise tolerated well No report of cardiac concerns or symptoms Strength training completed today  Goals Unmet:  Not Applicable  Comments: Reviewed home exercise with pt today.  Pt plans to walk for exercise.  Reviewed THR, pulse, RPE, sign and symptoms, NTG use, and when to call 911 or MD.  Also discussed weather considerations and indoor options.  Pt voiced understanding.    Dr. Emily Filbert is Medical Director for [place] and [facility].

## 2016-10-05 NOTE — Progress Notes (Signed)
Daily Session Note  Patient Details  Name: VERLAND SPRINKLE MRN: [id number] Date of Birth: 09-10-1955 Referring Provider:     Cardiac Rehab from 09/27/2016 in [facility] Cardiac and Pulmonary Rehab  Referring Provider  Henke      Encounter Date: 10/05/2016  Check In:     Session Check In - 10/05/16 0806      Check-In   Location [facility]-Cardiac & Pulmonary Rehab   Staff Present Darel Hong, RN BSN;Carroll Enterkin, RN, Vickki Hearing, BA, ACSM CEP, Exercise Physiologist;Other  Joellyn Rued, BS EP   Supervising physician immediately available to respond to emergencies See telemetry face sheet for immediately available ER MD   Medication changes reported     Yes   Fall or balance concerns reported    No   Tobacco Cessation No Change   Warm-up and Cool-down Performed on first and last piece of equipment   Resistance Training Performed Yes   VAD Patient? No     Pain Assessment   Currently in Pain? No/denies   Multiple Pain Sites No         History  Smoking Status  . Former Smoker  . Types: Pipe  . Quit date: 1982  Smokeless Tobacco  . Never Used    Goals Met:  Independence with exercise equipment Exercise tolerated well No report of cardiac concerns or symptoms Strength training completed today  Goals Unmet:  Not Applicable  Comments: Pt able to follow exercise prescription today without complaint.  Will continue to monitor for progression.    Dr. Emily Filbert is Medical Director for [place] and [facility].

## 2016-10-05 NOTE — Progress Notes (Signed)
Cardiac Individual Treatment Plan  Patient Details  Name: Shawn Elliott MRN: [id number] Date of Birth: Dec 23, 1955 Referring Provider:     Cardiac Rehab from 09/27/2016 in [facility] Cardiac and Pulmonary Rehab  Referring Provider  Henke      Initial Encounter Date:    Cardiac Rehab from 09/27/2016 in [facility] Cardiac and Pulmonary Rehab  Date  09/27/16  Referring Provider  Henke      Visit Diagnosis: S/P CABG x 4  ST elevation myocardial infarction (STEMI), unspecified artery ([place])  Status post coronary artery stent placement  Patient's Home Medications on Admission:  Current Outpatient Prescriptions:  .  albuterol (PROVENTIL HFA) 108 (90 Base) MCG/ACT inhaler, Inhale 2 puffs into the lungs every 4 (four) hours as needed for wheezing or shortness of breath. (Patient not taking: Reported on 09/27/2016), Disp: 1 Inhaler, Rfl: 0 .  amiodarone (PACERONE) 200 MG tablet, Take by mouth., Disp: , Rfl:  .  apixaban (ELIQUIS) 5 MG TABS tablet, Take by mouth., Disp: , Rfl:  .  aspirin EC 81 MG tablet, Take 81 mg by mouth daily., Disp: , Rfl:  .  atorvastatin (LIPITOR) 80 MG tablet, Take 1 tablet (80 mg total) by mouth daily at 6 PM. (Patient taking differently: Take 80 mg by mouth at bedtime. ), Disp: 90 tablet, Rfl: 3 .  ibuprofen (ADVIL,MOTRIN) 200 MG tablet, Take 400 mg by mouth every 6 (six) hours as needed (pain)., Disp: , Rfl:  .  isosorbide mononitrate (IMDUR) 30 MG 24 hr tablet, Take 1 tablet (30 mg total) by mouth daily., Disp: 90 tablet, Rfl: 3 .  lisinopril (PRINIVIL,ZESTRIL) 2.5 MG tablet, Take 1 tablet (2.5 mg total) by mouth daily., Disp: 90 tablet, Rfl: 3 .  metoprolol tartrate (LOPRESSOR) 25 MG tablet, Take 0.5 tablets (12.5 mg total) by mouth 2 (two) times daily., Disp: 90 tablet, Rfl: 3 .  nitroGLYCERIN (NITROSTAT) 0.4 MG SL tablet, Place 1 tablet (0.4 mg total) under the tongue every 5 (five) minutes as needed for chest pain. (Patient not taking: Reported on 09/27/2016), Disp: 25  tablet, Rfl: 2 .  omeprazole (PRILOSEC) 20 MG capsule, Take 20 mg by mouth daily., Disp: , Rfl:  .  predniSONE (DELTASONE) 20 MG tablet, Take 2 tablets (40 mg total) by mouth daily. (Patient not taking: Reported on 09/27/2016), Disp: 8 tablet, Rfl: 0 .  ticagrelor (BRILINTA) 90 MG TABS tablet, Take 1 tablet (90 mg total) by mouth 2 (two) times daily., Disp: 180 tablet, Rfl: 3  Past Medical History: Past Medical History:  Diagnosis Date  . Coronary artery disease   . GERD (gastroesophageal reflux disease)   . High cholesterol   . History of kidney stones    "passed them" (01/17/2016)  . Hypertension   . PONV (postoperative nausea and vomiting)    "when I woke up from my hernia I was nauseated"  . STEMI (ST elevation myocardial infarction) ([place]) 01/12/2016    Tobacco Use: History  Smoking Status  . Former Smoker  . Types: Pipe  . Quit date: 1982  Smokeless Tobacco  . Never Used    Labs: Recent Review Flowsheet Data    Labs for ITP Cardiac and Pulmonary Rehab Latest Ref Rng & Units 01/12/2016 03/17/2016 04/20/2016   Cholestrol 100 - 199 mg/dL 266(H) 97 111   LDLCALC 0 - 99 mg/dL 182(H) 50 56   HDL >39 mg/dL 47 25(L) 34(L)   Trlycerides 0 - 149 mg/dL 183(H) 112 107   Hemoglobin A1c 4.8 - 5.6 %  5.4 - -   TCO2 0 - 100 mmol/L 21 - -       Exercise Target Goals:    Exercise Program Goal: Individual exercise prescription set with THRR, safety & activity barriers. Participant demonstrates ability to understand and report RPE using BORG scale, to self-measure pulse accurately, and to acknowledge the importance of the exercise prescription.  Exercise Prescription Goal: Starting with aerobic activity 30 plus minutes a day, 3 days per week for initial exercise prescription. Provide home exercise prescription and guidelines that participant acknowledges understanding prior to discharge.  Activity Barriers & Risk Stratification:     Activity Barriers & Cardiac Risk Stratification -  09/27/16 1426      Activity Barriers & Cardiac Risk Stratification   Activity Barriers Shortness of Breath  Still gets short of breath when bending over or with certain activities   Cardiac Risk Stratification High      6 Minute Walk:     6 Minute Walk    Row Name 09/27/16 1422         6 Minute Walk   Distance 1300 feet     Walk Time 6 minutes     # of Rest Breaks 0     MPH 2.46     METS 3.42     RPE 9     Perceived Dyspnea  1     VO2 Peak 11.99     Symptoms No     Resting HR 56 bpm     Resting BP 126/78     Resting Oxygen Saturation  97 %     Exercise Oxygen Saturation  during 6 min walk 97 %     Max Ex. HR 97 bpm     Max Ex. BP 138/76     2 Minute Post BP 134/70        Oxygen Initial Assessment:   Oxygen Re-Evaluation:   Oxygen Discharge (Final Oxygen Re-Evaluation):   Initial Exercise Prescription:     Initial Exercise Prescription - 09/27/16 1400      Date of Initial Exercise RX and Referring Provider   Date 09/27/16   Referring Provider Henke     Treadmill   MPH 2.4   Grade 1.5   Minutes 15   METs 3.33     Recumbant Bike   Level 5   RPM 60   Watts 45   Minutes 15   METs 3.35     REL-XR   Level 3   Speed 50   Minutes 15   METs 3.3     T5 Nustep   Level 3   SPM 80   Minutes 15   METs 3.3     Prescription Details   Frequency (times per week) 3   Duration Progress to 45 minutes of aerobic exercise without signs/symptoms of physical distress     Intensity   THRR 40-80% of Max Heartrate 97-139   Ratings of Perceived Exertion 11-13   Perceived Dyspnea 0-4     Resistance Training   Training Prescription Yes   Weight 3   Reps 10-15      Perform Capillary Blood Glucose checks as needed.  Exercise Prescription Changes:     Exercise Prescription Changes    Row Name 09/27/16 1400 10/05/16 0900           Response to Exercise   Blood Pressure (Admit) 126/78  -      Blood Pressure (Exercise) 138/76  -  Blood  Pressure (Exit) 134/70  -      Heart Rate (Admit) 66 bpm  -      Heart Rate (Exercise) 97 bpm  -      Heart Rate (Exit) 63 bpm  -      Oxygen Saturation (Admit) 97 %  -      Oxygen Saturation (Exit) 97 %  -      Rating of Perceived Exertion (Exercise) 9  -        Home Exercise Plan   Plans to continue exercise at  - Home (comment)  walking      Frequency  - Add 3 additional days to program exercise sessions.  already walks 30 min per day most days      Initial Home Exercises Provided  - 10/05/16         Exercise Comments:     Exercise Comments    Row Name 10/01/16 0818           Exercise Comments First full day of exercise!  Patient was oriented to gym and equipment including functions, settings, policies, and procedures.  Patient's individual exercise prescription and treatment plan were reviewed.  All starting workloads were established based on the results of the 6 minute walk test done at initial orientation visit.  The plan for exercise progression was also introduced and progression will be customized based on patient's performance and goals.          Exercise Goals and Review:     Exercise Goals    Row Name 09/27/16 1422             Exercise Goals   Increase Physical Activity Yes       Intervention Provide advice, education, support and counseling about physical activity/exercise needs.;Develop an individualized exercise prescription for aerobic and resistive training based on initial evaluation findings, risk stratification, comorbidities and participant's personal goals.       Expected Outcomes Achievement of increased cardiorespiratory fitness and enhanced flexibility, muscular endurance and strength shown through measurements of functional capacity and personal statement of participant.       Increase Strength and Stamina Yes       Intervention Provide advice, education, support and counseling about physical activity/exercise needs.;Develop an individualized  exercise prescription for aerobic and resistive training based on initial evaluation findings, risk stratification, comorbidities and participant's personal goals.       Expected Outcomes Achievement of increased cardiorespiratory fitness and enhanced flexibility, muscular endurance and strength shown through measurements of functional capacity and personal statement of participant.       Able to understand and use rate of perceived exertion (RPE) scale Yes       Intervention Provide education and explanation on how to use RPE scale       Expected Outcomes Short Term: Able to use RPE daily in rehab to express subjective intensity level;Long Term:  Able to use RPE to guide intensity level when exercising independently       Able to understand and use Dyspnea scale Yes       Intervention Provide education and explanation on how to use Dyspnea scale       Expected Outcomes Short Term: Able to use Dyspnea scale daily in rehab to express subjective sense of shortness of breath during exertion;Long Term: Able to use Dyspnea scale to guide intensity level when exercising independently       Knowledge and understanding of Target Heart Rate Range (THRR) Yes  Intervention Provide education and explanation of THRR including how the numbers were predicted and where they are located for reference       Expected Outcomes Short Term: Able to state/look up THRR;Long Term: Able to use THRR to govern intensity when exercising independently;Short Term: Able to use daily as guideline for intensity in rehab       Able to check pulse independently Yes       Intervention Provide education and demonstration on how to check pulse in carotid and radial arteries.;Review the importance of being able to check your own pulse for safety during independent exercise       Expected Outcomes Short Term: Able to explain why pulse checking is important during independent exercise;Long Term: Able to check pulse independently and  accurately       Understanding of Exercise Prescription Yes       Intervention Provide education, explanation, and written materials on patient's individual exercise prescription       Expected Outcomes Short Term: Able to explain program exercise prescription;Long Term: Able to explain home exercise prescription to exercise independently          Exercise Goals Re-Evaluation :     Exercise Goals Re-Evaluation    Row Name 10/01/16 0818 10/05/16 0913 10/05/16 0914         Exercise Goal Re-Evaluation   Exercise Goals Review Able to understand and use rate of perceived exertion (RPE) scale;Knowledge and understanding of Target Heart Rate Range (THRR);Understanding of Exercise Prescription Increase Physical Activity;Increase Strength and Stamina;Able to understand and use rate of perceived exertion (RPE) scale;Knowledge and understanding of Target Heart Rate Range (THRR);Able to check pulse independently;Understanding of Exercise Prescription Increase Physical Activity;Increase Strength and Stamina;Able to understand and use rate of perceived exertion (RPE) scale;Knowledge and understanding of Target Heart Rate Range (THRR);Able to check pulse independently;Understanding of Exercise Prescription     Comments Reviewed RPE scale, THR and program prescription with pt today.  Pt voiced understanding and was given a copy of goals to take home.   - Reviewed home exercise with pt today.  Pt plans to walk for exercise.  Reviewed THR, pulse, RPE, sign and symptoms, NTG use, and when to call 911 or MD.  Also discussed weather considerations and indoor options.  Pt voiced understanding.     Expected Outcomes Short: Use RPE daily to regulate intensity.  Long: Follow program prescription in THR.  - Short - Ed will continue walking on days he doesn't attend HT.  He will monitor RPE when walking.  Long - Ed will maintain exercise on his own        Discharge Exercise Prescription (Final Exercise Prescription  Changes):     Exercise Prescription Changes - 10/05/16 0900      Home Exercise Plan   Plans to continue exercise at Home (comment)  walking   Frequency Add 3 additional days to program exercise sessions.  already walks 30 min per day most days   Initial Home Exercises Provided 10/05/16      Nutrition:  Target Goals: Understanding of nutrition guidelines, daily intake of sodium <1570m, cholesterol <2027m calories 30% from fat and 7% or less from saturated fats, daily to have 5 or more servings of fruits and vegetables.  Biometrics:     Pre Biometrics - 09/27/16 1413      Pre Biometrics   Height _0  (1.854 m)   Weight 222 lb 14.4 oz (101.1 kg)   Waist Circumference 42 inches  Hip Circumference 42.5 inches   Waist to Hip Ratio 0.99 %   BMI (Calculated) 29.41   Single Leg Stand 16.78 seconds       Nutrition Therapy Plan and Nutrition Goals:   Nutrition Discharge: Rate Your Plate Scores:     Nutrition Assessments - 09/27/16 1413      MEDFICTS Scores   Pre Score 24      Nutrition Goals Re-Evaluation:   Nutrition Goals Discharge (Final Nutrition Goals Re-Evaluation):   Psychosocial: Target Goals: Acknowledge presence or absence of significant depression and/or stress, maximize coping skills, provide positive support system. Participant is able to verbalize types and ability to use techniques and skills needed for reducing stress and depression.   Initial Review & Psychosocial Screening:     Initial Psych Review & Screening - 09/27/16 1417      Initial Review   Current issues with Current Sleep Concerns  Ed reports having trouble staying asleep. What he is currently doing is staying up super late until he can't hold his eyes open then he is actually being able to sleep until [place]? Yes  Wife, family, friends      Screening Interventions   Interventions Yes;Encouraged to exercise;Program counselor consult    Expected Outcomes Short Term goal: Utilizing psychosocial counselor, staff and physician to assist with identification of specific Stressors or current issues interfering with healing process. Setting desired goal for each stressor or current issue identified.;Long Term Goal: Stressors or current issues are controlled or eliminated.;Short Term goal: Identification and review with participant of any Quality of Life or Depression concerns found by scoring the questionnaire.;Long Term goal: The participant improves quality of Life and PHQ9 Scores as seen by post scores and/or verbalization of changes      Quality of Life Scores:      Quality of Life - 09/27/16 1419      Quality of Life Scores   Health/Function Pre 20.86 %   Socioeconomic Pre 20.63 %   Psych/Spiritual Pre 20.71 %   Family Pre 21 %   GLOBAL Pre 20.79 %      PHQ-9: Recent Review Flowsheet Data    Depression screen [facility] 2/9 09/27/2016 02/06/2016   Decreased Interest 0 0   Down, Depressed, Hopeless 0 0   PHQ - 2 Score 0 0   Altered sleeping 1 0   Tired, decreased energy 1 0   Change in appetite 0 0   Feeling bad or failure about yourself  0 0   Trouble concentrating 0 0   Moving slowly or fidgety/restless 0 0   Suicidal thoughts 0 0   PHQ-9 Score 2 0   Difficult doing work/chores Not difficult at all Not difficult at all     Interpretation of Total Score  Total Score Depression Severity:  1-4 = Minimal depression, 5-9 = Mild depression, 10-14 = Moderate depression, 15-19 = Moderately severe depression, 20-27 = Severe depression   Psychosocial Evaluation and Intervention:     Psychosocial Evaluation - 10/01/16 0908      Psychosocial Evaluation & Interventions   Comments Mr Croft returned today after (7) 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  Counselor met with Ed for initial psychosocial evaluation for this course of Cardiac Rehab.  He is a 61 year old who has a strong support system with a spouse; lots  of family; friends and Ed is actively involved in his local church.  He sleeps well and has a good appetite; although he has lost 52 lbs. since his first heart attack in January of this year.  Ed denies a history of depresion or anxiety or any current symptoms.  He reports being in a positive mood most of the time now that he can drive and engage in more normal activities.  He has some additional stress being out of work and on [place] and his health.  Ed has goals to increase his stamina and strength while in this program to be able to return to work in November.  Staff will follow with Ed throughout the course of this program.    Expected Outcomes Ed will benefit from consistent exercise to achieve his stated goals.  The educational and psychoeducational components will be helpful to learn and manage his disease better.     Continue Psychosocial Services  Follow up required by staff      Psychosocial Re-Evaluation:     Psychosocial Re-Evaluation    [place] Name 10/05/16 [phone] 10/05/16 0954           Psychosocial Re-Evaluation   Comments Ed says he feels so much better now that he does not have afib. He states he is not depressed and not anxious.   -      Expected Outcomes  - Ed does have a good support system. with his wife and family      Continue Psychosocial Services   - No Follow up required         Psychosocial Discharge (Final Psychosocial Re-Evaluation):     Psychosocial Re-Evaluation - 10/05/16 0954      Psychosocial Re-Evaluation   Expected Outcomes Ed does have a good support system. with his wife and family   Continue Psychosocial Services  No Follow up required      Vocational Rehabilitation: Provide vocational rehab assistance to qualifying candidates.   Vocational Rehab Evaluation & Intervention:     Vocational Rehab - 09/27/16 1424      Initial Vocational Rehab Evaluation & Intervention   Assessment shows need for Vocational Rehabilitation No       Education: Education Goals: Education classes will be provided on a variety of topics geared toward better understanding of heart health and risk factor modification. Participant will state understanding/return demonstration of topics presented as noted by education test scores.  Learning Barriers/Preferences:     Learning Barriers/Preferences - 09/27/16 1420      Learning Barriers/Preferences   Learning Barriers None   Learning Preferences None      Education Topics: General Nutrition Guidelines/Fats and Fiber: -Group instruction provided by verbal, written material, models and posters to present the general guidelines for heart healthy nutrition. Gives an explanation and review of dietary fats and fiber.   Controlling Sodium/Reading Food Labels: -Group verbal and written material supporting the discussion of sodium use in heart healthy nutrition. Review and explanation with models, verbal and written materials for utilization of the food label.   Exercise Physiology & Risk Factors: - Group verbal and written instruction with models to review the exercise physiology of the cardiovascular system and associated critical values. Details cardiovascular disease risk factors and the goals associated with each risk factor.   Aerobic Exercise & Resistance Training: - Gives group verbal and written discussion on the health impact of inactivity. On the components of aerobic and resistive training programs and the benefits of this training and how to safely progress through these programs.   Flexibility, Balance, General  Exercise Guidelines: - Provides group verbal and written instruction on the benefits of flexibility and balance training programs. Provides general exercise guidelines with specific guidelines to those with heart or lung disease. Demonstration and skill practice provided.   Stress Management: - Provides group verbal and written instruction about the health risks of  elevated stress, cause of high stress, and healthy ways to reduce stress.   Cardiac Rehab from 02/29/2016 in [facility] Cardiac and Pulmonary Rehab  Date  02/15/16  Educator  [facility]  Instruction Review Code (retired)  2- meets goals/outcomes      Depression: - Provides group verbal and written instruction on the correlation between heart/lung disease and depressed mood, treatment options, and the stigmas associated with seeking treatment.   Anatomy & Physiology of the Heart: - Group verbal and written instruction and models provide basic cardiac anatomy and physiology, with the coronary electrical and arterial systems. Review of: AMI, Angina, Valve disease, Heart Failure, Cardiac Arrhythmia, Pacemakers, and the ICD.   Cardiac Procedures: - Group verbal and written instruction to review commonly prescribed medications for heart disease. Reviews the medication, class of the drug, and side effects. Includes the steps to properly store meds and maintain the prescription regimen. (beta blockers and nitrates)   Cardiac Rehab from 10/01/2016 in [facility] Cardiac and Pulmonary Rehab  Date  10/01/16  Educator  CE  Instruction Review Code  1- Verbalizes Understanding      Cardiac Medications I: - Group verbal and written instruction to review commonly prescribed medications for heart disease. Reviews the medication, class of the drug, and side effects. Includes the steps to properly store meds and maintain the prescription regimen.   Cardiac Rehab from 02/29/2016 in [facility] Cardiac and Pulmonary Rehab  Date  02/27/16 [02/29/16 Second Part]  Educator  CE  Instruction Review Code (retired)  2- meets goals/outcomes      Cardiac Medications II: -Group verbal and written instruction to review commonly prescribed medications for heart disease. Reviews the medication, class of the drug, and side effects. (all other drug classes)    Go Sex-Intimacy & Heart Disease, Get SMART - Goal Setting: - Group verbal and  written instruction through game format to discuss heart disease and the return to sexual intimacy. Provides group verbal and written material to discuss and apply goal setting through the application of the S.M.A.R.T. Method.   Cardiac Rehab from 10/01/2016 in [facility] Cardiac and Pulmonary Rehab  Date  10/01/16  Educator  CE  Instruction Review Code  1- Verbalizes Understanding      Other Matters of the Heart: - Provides group verbal, written materials and models to describe Heart Failure, Angina, Valve Disease, Peripheral Artery Disease, and Diabetes in the realm of heart disease. Includes description of the disease process and treatment options available to the cardiac patient.   Exercise & Equipment Safety: - Individual verbal instruction and demonstration of equipment use and safety with use of the equipment.   Cardiac Rehab from 10/01/2016 in [facility] Cardiac and Pulmonary Rehab  Date  09/27/16  Educator  [facility]  Instruction Review Code  1- Verbalizes Understanding      Infection Prevention: - Provides verbal and written material to individual with discussion of infection control including proper hand washing and proper equipment cleaning during exercise session.   Cardiac Rehab from 10/01/2016 in [facility] Cardiac and Pulmonary Rehab  Date  09/27/16  Educator  [facility]  Instruction Review Code  1- Verbalizes Understanding      Falls Prevention: - Provides  verbal and written material to individual with discussion of falls prevention and safety.   Cardiac Rehab from 10/01/2016 in [facility] Cardiac and Pulmonary Rehab  Date  09/27/16  Educator  [facility]  Instruction Review Code  1- Verbalizes Understanding      Diabetes: - Individual verbal and written instruction to review signs/symptoms of diabetes, desired ranges of glucose level fasting, after meals and with exercise. Acknowledge that pre and post exercise glucose checks will be done for 3 sessions at entry of program.   Other: -Provides group and  verbal instruction on various topics (see comments)    Knowledge Questionnaire Score:     Knowledge Questionnaire Score - 09/27/16 1421      Knowledge Questionnaire Score   Pre Score 27/28  Correct answers reviewed with Ed      Core Components/Risk Factors/Patient Goals at Admission:     Personal Goals and Risk Factors at Admission - 09/27/16 1403      Core Components/Risk Factors/Patient Goals on Admission   Heart Failure Yes   Intervention Provide a combined exercise and nutrition program that is supplemented with education, support and counseling about heart failure. Directed toward relieving symptoms such as shortness of breath, decreased exercise tolerance, and extremity edema.   Expected Outcomes Improve functional capacity of life;Short term: Attendance in program 2-3 days a week with increased exercise capacity. Reported lower sodium intake. Reported increased fruit and vegetable intake. Reports medication compliance.;Short term: Daily weights obtained and reported for increase. Utilizing diuretic protocols set by physician.;Long term: Adoption of self-care skills and reduction of barriers for early signs and symptoms recognition and intervention leading to self-care maintenance.   Hypertension Yes   Intervention Provide education on lifestyle modifcations including regular physical activity/exercise, weight management, moderate sodium restriction and increased consumption of fresh fruit, vegetables, and low fat dairy, alcohol moderation, and smoking cessation.;Monitor prescription use compliance.   Expected Outcomes Short Term: Continued assessment and intervention until BP is < 140/3m HG in hypertensive participants. < 130/846mHG in hypertensive participants with diabetes, heart failure or chronic kidney disease.;Long Term: Maintenance of blood pressure at goal levels.   Lipids Yes   Intervention Provide education and support for participant on nutrition & aerobic/resistive  exercise along with prescribed medications to achieve LDL <7097mHDL >66m70m Expected Outcomes Short Term: Participant states understanding of desired cholesterol values and is compliant with medications prescribed. Participant is following exercise prescription and nutrition guidelines.;Long Term: Cholesterol controlled with medications as prescribed, with individualized exercise RX and with personalized nutrition plan. Value goals: LDL < 70mg39mL > 40 mg.      Core Components/Risk Factors/Patient Goals Review:    Core Components/Risk Factors/Patient Goals at Discharge (Final Review):    ITP Comments:     ITP Comments    Row Name 09/27/16 1350 10/05/16 0944         ITP Comments Med Review completed. Initial ITP created. Diagnosis can be found in [facility] 8/28 Ed reports that his MD started him on a heart monitor for history of afib. Ed reports that he has not felt any of his afib lately. Cardiac Rehab monitor today showed Sinus Rhythm and Sinus Tachycardia only. Ed reported his furosemide, Amiodarone, Aspirin but to cont on Brilanta an Eliquis. 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 It is still sore rarely at times but nothing like it was.         Comments:

## 2016-10-08 ENCOUNTER — Encounter: Payer: Managed Care, Other (non HMO) | Attending: Internal Medicine | Admitting: *Deleted

## 2016-10-08 DIAGNOSIS — I509 Heart failure, unspecified: Secondary | ICD-10-CM | POA: Insufficient documentation

## 2016-10-08 DIAGNOSIS — Z951 Presence of aortocoronary bypass graft: Secondary | ICD-10-CM | POA: Insufficient documentation

## 2016-10-08 DIAGNOSIS — Z48812 Encounter for surgical aftercare following surgery on the circulatory system: Secondary | ICD-10-CM | POA: Insufficient documentation

## 2016-10-08 NOTE — Progress Notes (Signed)
Daily Session Note  Patient Details  Name: DEMON VOLANTE MRN: [id number] Date of Birth: 15-Nov-1955 Referring Provider:     Cardiac Rehab from 09/27/2016 in [facility] Cardiac and Pulmonary Rehab  Referring Provider  [place]      Encounter Date: 10/08/2016  Check In:     Session Check In - 10/08/16 0756      Check-In   Location [facility]-Cardiac & Pulmonary Rehab   Staff Present Alberteen Sam, MA, ACSM RCEP, Exercise Physiologist;Kelly Amedeo Plenty, BS, ACSM CEP, Exercise Physiologist;Meredith Sherryll Burger, RN BSN   Supervising physician immediately available to respond to emergencies See telemetry face sheet for immediately available ER MD   Medication changes reported     No   Fall or balance concerns reported    No   Warm-up and Cool-down Performed on first and last piece of equipment   Resistance Training Performed Yes   VAD Patient? No     Pain Assessment   Currently in Pain? No/denies   Multiple Pain Sites No         History  Smoking Status  . Former Smoker  . Types: Pipe  . Quit date: 1982  Smokeless Tobacco  . Never Used    Goals Met:  Independence with exercise equipment Exercise tolerated well No report of cardiac concerns or symptoms Strength training completed today  Goals Unmet:  Not Applicable  Comments: Pt able to follow exercise prescription today without complaint.  Will continue to monitor for progression.    Dr. Emily Filbert is Medical Director for [place] and [facility].

## 2016-10-10 ENCOUNTER — Encounter: Payer: Managed Care, Other (non HMO) | Admitting: *Deleted

## 2016-10-10 DIAGNOSIS — Z48812 Encounter for surgical aftercare following surgery on the circulatory system: Secondary | ICD-10-CM | POA: Diagnosis not present

## 2016-10-10 DIAGNOSIS — Z951 Presence of aortocoronary bypass graft: Secondary | ICD-10-CM

## 2016-10-10 NOTE — Progress Notes (Signed)
Daily Session Note  Patient Details  Name: Shawn Elliott MRN: [id number] Date of Birth: Jul 11, 1955 Referring Provider:     Cardiac Rehab from 09/27/2016 in [facility] Cardiac and Pulmonary Rehab  Referring Provider  [place]      Encounter Date: 10/10/2016  Check In:     Session Check In - 10/10/16 0756      Check-In   Location [facility]-Cardiac & Pulmonary Rehab   Staff Present Alberteen Sam, MA, ACSM RCEP, Exercise Physiologist;Mary Kellie Shropshire, RN, BSN, Lauretta Grill RCP,RRT,BSRT   Supervising physician immediately available to respond to emergencies See telemetry face sheet for immediately available ER MD   Medication changes reported     No   Fall or balance concerns reported    No   Warm-up and Cool-down Performed on first and last piece of equipment   Resistance Training Performed Yes   VAD Patient? No     Pain Assessment   Currently in Pain? No/denies   Multiple Pain Sites No         History  Smoking Status  . Former Smoker  . Types: Pipe  . Quit date: 1982  Smokeless Tobacco  . Never Used    Goals Met:  Independence with exercise equipment Exercise tolerated well No report of cardiac concerns or symptoms Strength training completed today  Goals Unmet:  Not Applicable  Comments: Pt able to follow exercise prescription today without complaint.  Will continue to monitor for progression.    Dr. Emily Filbert is Medical Director for [place] and [facility].

## 2016-10-12 DIAGNOSIS — I213 ST elevation (STEMI) myocardial infarction of unspecified site: Secondary | ICD-10-CM

## 2016-10-12 DIAGNOSIS — Z951 Presence of aortocoronary bypass graft: Secondary | ICD-10-CM

## 2016-10-12 DIAGNOSIS — Z48812 Encounter for surgical aftercare following surgery on the circulatory system: Secondary | ICD-10-CM | POA: Diagnosis not present

## 2016-10-12 DIAGNOSIS — Z955 Presence of coronary angioplasty implant and graft: Secondary | ICD-10-CM

## 2016-10-12 NOTE — Progress Notes (Signed)
Daily Session Note  Patient Details  Name: Shawn Elliott MRN: [id number] Date of Birth: [birth year] Referring Provider:     Cardiac Rehab from 09/27/2016 in [facility] Rehab  Referring Provider  [place]      Encounter Date: 10/12/2016  Check In:     Session Check In - 10/12/16 0825      Check-In   Location [facility]-Cardiac & Pulmonary Rehab   Staff Present Gerlene Burdock, RN, Vickki Hearing, BA, ACSM CEP, Exercise Physiologist;Jessica Luan Pulling, MA, ACSM RCEP, Exercise Physiologist   Supervising physician immediately available to respond to emergencies See telemetry face sheet for immediately available ER MD   Medication changes reported     No   Fall or balance concerns reported    No   Warm-up and Cool-down Performed on first and last piece of equipment   Resistance Training Performed Yes   VAD Patient? No     Pain Assessment   Currently in Pain? No/denies         History  Smoking Status  . Former Smoker  . Types: Pipe  . Quit date: 1982  Smokeless Tobacco  . Never Used    Goals Met:  Independence with exercise equipment Exercise tolerated well No report of cardiac concerns or symptoms Strength training completed today  Goals Unmet:  Not Applicable  Comments: Pt able to follow exercise prescription today without complaint.  Will continue to monitor for progression.    Dr. Emily Filbert is Medical Director for [place] [facility].

## 2016-10-15 ENCOUNTER — Encounter: Payer: Managed Care, Other (non HMO) | Admitting: *Deleted

## 2016-10-15 DIAGNOSIS — I213 ST elevation (STEMI) myocardial infarction of unspecified site: Secondary | ICD-10-CM

## 2016-10-15 DIAGNOSIS — Z48812 Encounter for surgical aftercare following surgery on the circulatory system: Secondary | ICD-10-CM | POA: Diagnosis not present

## 2016-10-15 DIAGNOSIS — Z955 Presence of coronary angioplasty implant and graft: Secondary | ICD-10-CM

## 2016-10-15 DIAGNOSIS — Z951 Presence of aortocoronary bypass graft: Secondary | ICD-10-CM

## 2016-10-15 NOTE — Progress Notes (Signed)
Daily Session Note  Patient Details  Name: OBED SAMEK MRN: [id number] Date of Birth: [birth year] Referring Provider:     Cardiac Rehab from 09/27/2016 in [facility] Cardiac and Pulmonary Rehab  Referring Provider  Henke      Encounter Date: 10/15/2016  Check In:     Session Check In - 10/15/16 0746      Check-In   Location [facility]-Cardiac & Pulmonary Rehab   Staff Present Earlean Shawl, BS, ACSM CEP, Exercise Physiologist;Carroll Enterkin, RN, Levie Heritage, MA, ACSM RCEP, Exercise Physiologist   Supervising physician immediately available to respond to emergencies See telemetry face sheet for immediately available ER MD   Medication changes reported     No   Fall or balance concerns reported    No   Tobacco Cessation No Change   Warm-up and Cool-down Performed on first and last piece of equipment   Resistance Training Performed Yes   VAD Patient? No     Pain Assessment   Currently in Pain? No/denies   Multiple Pain Sites No         History  Smoking Status  . Former Smoker  . Types: Pipe  . Quit date: 1982  Smokeless Tobacco  . Never Used    Goals Met:  Independence with exercise equipment Exercise tolerated well No report of cardiac concerns or symptoms Strength training completed today  Goals Unmet:  Not Applicable  Comments: Pt able to follow exercise prescription today without complaint.  Will continue to monitor for progression.    Dr. Emily Filbert is Medical Director for [place] and [facility].

## 2016-10-17 ENCOUNTER — Encounter: Payer: Self-pay | Admitting: *Deleted

## 2016-10-17 DIAGNOSIS — Z951 Presence of aortocoronary bypass graft: Secondary | ICD-10-CM

## 2016-10-17 DIAGNOSIS — Z48812 Encounter for surgical aftercare following surgery on the circulatory system: Secondary | ICD-10-CM | POA: Diagnosis not present

## 2016-10-17 NOTE — Progress Notes (Signed)
Cardiac Individual Treatment Plan  Patient Details  Name: Shawn Elliott MRN: [id number] Date of Birth: [birth year] Referring Provider:     Cardiac Rehab from 09/27/2016 in [facility] Cardiac and Pulmonary Rehab  Referring Provider  Henke      Initial Encounter Date:    Cardiac Rehab from 09/27/2016 in [facility] Cardiac and Pulmonary Rehab  Date  09/27/16  Referring Provider  Henke      Visit Diagnosis: No diagnosis found.  Patient's Home Medications on Admission:  Current Outpatient Prescriptions:  .  albuterol (PROVENTIL HFA) 108 (90 Base) MCG/ACT inhaler, Inhale 2 puffs into the lungs every 4 (four) hours as needed for wheezing or shortness of breath. (Patient not taking: Reported on 09/27/2016), Disp: 1 Inhaler, Rfl: 0 .  amiodarone (PACERONE) 200 MG tablet, Take by mouth., Disp: , Rfl:  .  apixaban (ELIQUIS) 5 MG TABS tablet, Take by mouth., Disp: , Rfl:  .  aspirin EC 81 MG tablet, Take 81 mg by mouth daily., Disp: , Rfl:  .  atorvastatin (LIPITOR) 80 MG tablet, Take 1 tablet (80 mg total) by mouth daily at 6 PM. (Patient taking differently: Take 80 mg by mouth at bedtime. ), Disp: 90 tablet, Rfl: 3 .  ibuprofen (ADVIL,MOTRIN) 200 MG tablet, Take 400 mg by mouth every 6 (six) hours as needed (pain)., Disp: , Rfl:  .  isosorbide mononitrate (IMDUR) 30 MG 24 hr tablet, Take 1 tablet (30 mg total) by mouth daily., Disp: 90 tablet, Rfl: 3 .  lisinopril (PRINIVIL,ZESTRIL) 2.5 MG tablet, Take 1 tablet (2.5 mg total) by mouth daily., Disp: 90 tablet, Rfl: 3 .  metoprolol tartrate (LOPRESSOR) 25 MG tablet, Take 0.5 tablets (12.5 mg total) by mouth 2 (two) times daily., Disp: 90 tablet, Rfl: 3 .  nitroGLYCERIN (NITROSTAT) 0.4 MG SL tablet, Place 1 tablet (0.4 mg total) under the tongue every 5 (five) minutes as needed for chest pain. (Patient not taking: Reported on 09/27/2016), Disp: 25 tablet, Rfl: 2 .  omeprazole (PRILOSEC) 20 MG capsule, Take 20 mg by mouth daily., Disp: , Rfl:  .  predniSONE  (DELTASONE) 20 MG tablet, Take 2 tablets (40 mg total) by mouth daily. (Patient not taking: Reported on 09/27/2016), Disp: 8 tablet, Rfl: 0 .  ticagrelor (BRILINTA) 90 MG TABS tablet, Take 1 tablet (90 mg total) by mouth 2 (two) times daily., Disp: 180 tablet, Rfl: 3  Past Medical History: Past Medical History:  Diagnosis Date  . Coronary artery disease   . GERD (gastroesophageal reflux disease)   . High cholesterol   . History of kidney stones    "passed them" (01/17/2016)  . Hypertension   . PONV (postoperative nausea and vomiting)    "when I woke up from my hernia I was nauseated"  . STEMI (ST elevation myocardial infarction) ([place]) 01/12/2016    Tobacco Use: History  Smoking Status  . Former Smoker  . Types: Pipe  . Quit date: 1982  Smokeless Tobacco  . Never Used    Labs: Recent Review Flowsheet Data    Labs for ITP Cardiac and Pulmonary Rehab Latest Ref Rng & Units 01/12/2016 03/17/2016 04/20/2016   Cholestrol 100 - 199 mg/dL 266(H) 97 111   LDLCALC 0 - 99 mg/dL 182(H) 50 56   HDL >39 mg/dL 47 25(L) 34(L)   Trlycerides 0 - 149 mg/dL 183(H) 112 107   Hemoglobin A1c 4.8 - 5.6 % 5.4 - -   TCO2 0 - 100 mmol/L 21 - -  Exercise Target Goals:    Exercise Program Goal: Individual exercise prescription set with THRR, safety & activity barriers. Participant demonstrates ability to understand and report RPE using BORG scale, to self-measure pulse accurately, and to acknowledge the importance of the exercise prescription.  Exercise Prescription Goal: Starting with aerobic activity 30 plus minutes a day, 3 days per week for initial exercise prescription. Provide home exercise prescription and guidelines that participant acknowledges understanding prior to discharge.  Activity Barriers & Risk Stratification:     Activity Barriers & Cardiac Risk Stratification - 09/27/16 1426      Activity Barriers & Cardiac Risk Stratification   Activity Barriers Shortness of Breath   Still gets short of breath when bending over or with certain activities   Cardiac Risk Stratification High      6 Minute Walk:     6 Minute Walk    Row Name 09/27/16 1422         6 Minute Walk   Distance 1300 feet     Walk Time 6 minutes     # of Rest Breaks 0     MPH 2.46     METS 3.42     RPE 9     Perceived Dyspnea  1     VO2 Peak 11.99     Symptoms No     Resting HR 56 bpm     Resting BP 126/78     Resting Oxygen Saturation  97 %     Exercise Oxygen Saturation  during 6 min walk 97 %     Max Ex. HR 97 bpm     Max Ex. BP 138/76     2 Minute Post BP 134/70        Oxygen Initial Assessment:   Oxygen Re-Evaluation:   Oxygen Discharge (Final Oxygen Re-Evaluation):   Initial Exercise Prescription:     Initial Exercise Prescription - 09/27/16 1400      Date of Initial Exercise RX and Referring Provider   Date 09/27/16   Referring Provider Henke     Treadmill   MPH 2.4   Grade 1.5   Minutes 15   METs 3.33     Recumbant Bike   Level 5   RPM 60   Watts 45   Minutes 15   METs 3.35     REL-XR   Level 3   Speed 50   Minutes 15   METs 3.3     T5 Nustep   Level 3   SPM 80   Minutes 15   METs 3.3     Prescription Details   Frequency (times per week) 3   Duration Progress to 45 minutes of aerobic exercise without signs/symptoms of physical distress     Intensity   THRR 40-80% of Max Heartrate 97-139   Ratings of Perceived Exertion 11-13   Perceived Dyspnea 0-4     Resistance Training   Training Prescription Yes   Weight 3   Reps 10-15      Perform Capillary Blood Glucose checks as needed.  Exercise Prescription Changes:     Exercise Prescription Changes    Row Name 09/27/16 1400 10/05/16 0900 10/10/16 1500         Response to Exercise   Blood Pressure (Admit) 126/78  - 126/64     Blood Pressure (Exercise) 138/76  - 126/66     Blood Pressure (Exit) 134/70  - 110/68     Heart Rate (Admit) 66 bpm  -  57 bpm     Heart Rate  (Exercise) 97 bpm  - 91 bpm     Heart Rate (Exit) 63 bpm  - 81 bpm     Oxygen Saturation (Admit) 97 %  -  -     Oxygen Saturation (Exit) 97 %  -  -     Rating of Perceived Exertion (Exercise) 9  - 12     Symptoms  -  - none     Duration  -  - Continue with 45 min of aerobic exercise without signs/symptoms of physical distress.     Intensity  -  - THRR unchanged       Progression   Progression  -  - Continue to progress workloads to maintain intensity without signs/symptoms of physical distress.     Average METs  -  - 3.08       Resistance Training   Training Prescription  -  - Yes     Weight  -  - 3     Reps  -  - 10-15       Interval Training   Interval Training  -  - No       Treadmill   MPH  -  - 2.4     Grade  -  - 1.5     Minutes  -  - 15     METs  -  - 3.33       Recumbant Bike   Level  -  - 6     Watts  -  - 46     Minutes  -  - 15     METs  -  - 3.42       T5 Nustep   Level  -  - 3     Minutes  -  - 15     METs  -  - 2.5       Home Exercise Plan   Plans to continue exercise at  - Home (comment)  walking Home (comment)  walking     Frequency  - Add 3 additional days to program exercise sessions.  already walks 30 min per day most days Add 3 additional days to program exercise sessions.  already walks 30 min per day most days     Initial Home Exercises Provided  - 10/05/16 10/05/16        Exercise Comments:     Exercise Comments    Row Name 10/01/16 0818           Exercise Comments First full day of exercise!  Patient was oriented to gym and equipment including functions, settings, policies, and procedures.  Patient's individual exercise prescription and treatment plan were reviewed.  All starting workloads were established based on the results of the 6 minute walk test done at initial orientation visit.  The plan for exercise progression was also introduced and progression will be customized based on patient's performance and goals.           Exercise Goals and Review:     Exercise Goals    Row Name 09/27/16 1422             Exercise Goals   Increase Physical Activity Yes       Intervention Provide advice, education, support and counseling about physical activity/exercise needs.;Develop an individualized exercise prescription for aerobic and resistive training based on initial evaluation findings, risk stratification, comorbidities and participant's personal goals.  Expected Outcomes Achievement of increased cardiorespiratory fitness and enhanced flexibility, muscular endurance and strength shown through measurements of functional capacity and personal statement of participant.       Increase Strength and Stamina Yes       Intervention Provide advice, education, support and counseling about physical activity/exercise needs.;Develop an individualized exercise prescription for aerobic and resistive training based on initial evaluation findings, risk stratification, comorbidities and participant's personal goals.       Expected Outcomes Achievement of increased cardiorespiratory fitness and enhanced flexibility, muscular endurance and strength shown through measurements of functional capacity and personal statement of participant.       Able to understand and use rate of perceived exertion (RPE) scale Yes       Intervention Provide education and explanation on how to use RPE scale       Expected Outcomes Short Term: Able to use RPE daily in rehab to express subjective intensity level;Long Term:  Able to use RPE to guide intensity level when exercising independently       Able to understand and use Dyspnea scale Yes       Intervention Provide education and explanation on how to use Dyspnea scale       Expected Outcomes Short Term: Able to use Dyspnea scale daily in rehab to express subjective sense of shortness of breath during exertion;Long Term: Able to use Dyspnea scale to guide intensity level when exercising  independently       Knowledge and understanding of Target Heart Rate Range (THRR) Yes       Intervention Provide education and explanation of THRR including how the numbers were predicted and where they are located for reference       Expected Outcomes Short Term: Able to state/look up THRR;Long Term: Able to use THRR to govern intensity when exercising independently;Short Term: Able to use daily as guideline for intensity in rehab       Able to check pulse independently Yes       Intervention Provide education and demonstration on how to check pulse in carotid and radial arteries.;Review the importance of being able to check your own pulse for safety during independent exercise       Expected Outcomes Short Term: Able to explain why pulse checking is important during independent exercise;Long Term: Able to check pulse independently and accurately       Understanding of Exercise Prescription Yes       Intervention Provide education, explanation, and written materials on patient's individual exercise prescription       Expected Outcomes Short Term: Able to explain program exercise prescription;Long Term: Able to explain home exercise prescription to exercise independently          Exercise Goals Re-Evaluation :     Exercise Goals Re-Evaluation    Row Name 10/01/16 0818 10/05/16 0913 10/05/16 0914 10/10/16 1540       Exercise Goal Re-Evaluation   Exercise Goals Review Able to understand and use rate of perceived exertion (RPE) scale;Knowledge and understanding of Target Heart Rate Range (THRR);Understanding of Exercise Prescription Increase Physical Activity;Increase Strength and Stamina;Able to understand and use rate of perceived exertion (RPE) scale;Knowledge and understanding of Target Heart Rate Range (THRR);Able to check pulse independently;Understanding of Exercise Prescription Increase Physical Activity;Increase Strength and Stamina;Able to understand and use rate of perceived exertion  (RPE) scale;Knowledge and understanding of Target Heart Rate Range (THRR);Able to check pulse independently;Understanding of Exercise Prescription Increase Physical Activity;Increase Strength and Stamina  Comments Reviewed RPE scale, THR and program prescription with pt today.  Pt voiced understanding and was given a copy of goals to take home.   - Reviewed home exercise with pt today.  Pt plans to walk for exercise.  Reviewed THR, pulse, RPE, sign and symptoms, NTG use, and when to call 911 or MD.  Also discussed weather considerations and indoor options.  Pt voiced understanding. Ed is off to a good start in rehab. He has been able to push himself.  He is already up to 46W on the recumbent bike.  We will continue to monitor his progression.     Expected Outcomes Short: Use RPE daily to regulate intensity.  Long: Follow program prescription in THR.  - Short - Ed will continue walking on days he doesn't attend HT.  He will monitor RPE when walking.  Long - Ed will maintain exercise on his own Short: Discuss adding in interval training for exercise.  Long: Continue to exercise independently.        Discharge Exercise Prescription (Final Exercise Prescription Changes):     Exercise Prescription Changes - 10/10/16 1500      Response to Exercise   Blood Pressure (Admit) 126/64   Blood Pressure (Exercise) 126/66   Blood Pressure (Exit) 110/68   Heart Rate (Admit) 57 bpm   Heart Rate (Exercise) 91 bpm   Heart Rate (Exit) 81 bpm   Rating of Perceived Exertion (Exercise) 12   Symptoms none   Duration Continue with 45 min of aerobic exercise without signs/symptoms of physical distress.   Intensity THRR unchanged     Progression   Progression Continue to progress workloads to maintain intensity without signs/symptoms of physical distress.   Average METs 3.08     Resistance Training   Training Prescription Yes   Weight 3   Reps 10-15     Interval Training   Interval Training No      Treadmill   MPH 2.4   Grade 1.5   Minutes 15   METs 3.33     Recumbant Bike   Level 6   Watts 46   Minutes 15   METs 3.42     T5 Nustep   Level 3   Minutes 15   METs 2.5     Home Exercise Plan   Plans to continue exercise at Home (comment)  walking   Frequency Add 3 additional days to program exercise sessions.  already walks 30 min per day most days   Initial Home Exercises Provided 10/05/16      Nutrition:  Target Goals: Understanding of nutrition guidelines, daily intake of sodium '1500mg'$ , cholesterol '200mg'$ , calories 30% from fat and 7% or less from saturated fats, daily to have 5 or more servings of fruits and vegetables.  Biometrics:     Pre Biometrics - 09/27/16 1413      Pre Biometrics   Height '6\' 1"'$  (1.854 m)   Weight 222 lb 14.4 oz (101.1 kg)   Waist Circumference 42 inches   Hip Circumference 42.5 inches   Waist to Hip Ratio 0.99 %   BMI (Calculated) 29.41   Single Leg Stand 16.78 seconds       Nutrition Therapy Plan and Nutrition Goals:   Nutrition Discharge: Rate Your Plate Scores:     Nutrition Assessments - 09/27/16 1413      MEDFICTS Scores   Pre Score 24      Nutrition Goals Re-Evaluation:   Nutrition Goals Discharge (Final Nutrition Goals  Re-Evaluation):   Psychosocial: Target Goals: Acknowledge presence or absence of significant depression and/or stress, maximize coping skills, provide positive support system. Participant is able to verbalize types and ability to use techniques and skills needed for reducing stress and depression.   Initial Review & Psychosocial Screening:     Initial Psych Review & Screening - 09/27/16 1417      Initial Review   Current issues with Current Sleep Concerns  Ed reports having trouble staying asleep. What he is currently doing is staying up super late until he can't hold his eyes open then he is actually being able to sleep until monring     Family Dynamics   Good Support System? Yes   Wife, family, friends      Screening Interventions   Interventions Yes;Encouraged to exercise;Program counselor consult   Expected Outcomes Short Term goal: Utilizing psychosocial counselor, staff and physician to assist with identification of specific Stressors or current issues interfering with healing process. Setting desired goal for each stressor or current issue identified.;Long Term Goal: Stressors or current issues are controlled or eliminated.;Short Term goal: Identification and review with participant of any Quality of Life or Depression concerns found by scoring the questionnaire.;Long Term goal: The participant improves quality of Life and PHQ9 Scores as seen by post scores and/or verbalization of changes      Quality of Life Scores:      Quality of Life - 09/27/16 1419      Quality of Life Scores   Health/Function Pre 20.86 %   Socioeconomic Pre 20.63 %   Psych/Spiritual Pre 20.71 %   Family Pre 21 %   GLOBAL Pre 20.79 %      PHQ-9: Recent Review Flowsheet Data    Depression screen [facility] 2/9 09/27/2016 02/06/2016   Decreased Interest 0 0   Down, Depressed, Hopeless 0 0   PHQ - 2 Score 0 0   Altered sleeping 1 0   Tired, decreased energy 1 0   Change in appetite 0 0   Feeling bad or failure about yourself  0 0   Trouble concentrating 0 0   Moving slowly or fidgety/restless 0 0   Suicidal thoughts 0 0   PHQ-9 Score 2 0   Difficult doing work/chores Not difficult at all Not difficult at all     Interpretation of Total Score  Total Score Depression Severity:  1-4 = Minimal depression, 5-9 = Mild depression, 10-14 = Moderate depression, 15-19 = Moderately severe depression, 20-27 = Severe depression   Psychosocial Evaluation and Intervention:     Psychosocial Evaluation - 10/01/16 0908      Psychosocial Evaluation & Interventions   Comments Mr Matters returned today after (7) 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  Counselor met with Ed for initial  psychosocial evaluation for this course of Cardiac Rehab.  He is a 61 year old who has a strong support system with a spouse; lots of family; friends and Ed is actively involved in his local church.  He sleeps well and has a good appetite; although he has lost 52 lbs. since his first heart attack in January of this year.  Ed denies a history of depresion or anxiety or any current symptoms.  He reports being in a positive mood most of the time now that he can drive and engage in more normal activities.  He has some additional stress being out of work and on ST disability and his health.  Ed has  goals to increase his stamina and strength while in this program to be able to return to work in November.  Staff will follow with Ed throughout the course of this program.    Expected Outcomes Ed will benefit from consistent exercise to achieve his stated goals.  The educational and psychoeducational components will be helpful to learn and manage his disease better.     Continue Psychosocial Services  Follow up required by staff      Psychosocial Re-Evaluation:     Psychosocial Re-Evaluation    [place] Name 10/05/16 [phone] 10/05/16 0954           Psychosocial Re-Evaluation   Comments Ed says he feels so much better now that he does not have afib. He states he is not depressed and not anxious.   -      Expected Outcomes  - Ed does have a good support system. with his wife and family      Continue Psychosocial Services   - No Follow up required         Psychosocial Discharge (Final Psychosocial Re-Evaluation):     Psychosocial Re-Evaluation - 10/05/16 0954      Psychosocial Re-Evaluation   Expected Outcomes Ed does have a good support system. with his wife and family   Continue Psychosocial Services  No Follow up required      Vocational Rehabilitation: Provide vocational rehab assistance to qualifying candidates.   Vocational Rehab Evaluation & Intervention:     Vocational Rehab - 09/27/16 1424       Initial Vocational Rehab Evaluation & Intervention   Assessment shows need for Vocational Rehabilitation No      Education: Education Goals: Education classes will be provided on a variety of topics geared toward better understanding of heart health and risk factor modification. Participant will state understanding/return demonstration of topics presented as noted by education test scores.  Learning Barriers/Preferences:     Learning Barriers/Preferences - 09/27/16 1420      Learning Barriers/Preferences   Learning Barriers None   Learning Preferences None      Education Topics: General Nutrition Guidelines/Fats and Fiber: -Group instruction provided by verbal, written material, models and posters to present the general guidelines for heart healthy nutrition. Gives an explanation and review of dietary fats and fiber.   Controlling Sodium/Reading Food Labels: -Group verbal and written material supporting the discussion of sodium use in heart healthy nutrition. Review and explanation with models, verbal and written materials for utilization of the food label.   Exercise Physiology & Risk Factors: - Group verbal and written instruction with models to review the exercise physiology of the cardiovascular system and associated critical values. Details cardiovascular disease risk factors and the goals associated with each risk factor.   Aerobic Exercise & Resistance Training: - Gives group verbal and written discussion on the health impact of inactivity. On the components of aerobic and resistive training programs and the benefits of this training and how to safely progress through these programs.   Flexibility, Balance, General Exercise Guidelines: - Provides group verbal and written instruction on the benefits of flexibility and balance training programs. Provides general exercise guidelines with specific guidelines to those with heart or lung disease. Demonstration and skill  practice provided.   Stress Management: - Provides group verbal and written instruction about the health risks of elevated stress, cause of high stress, and healthy ways to reduce stress.   Cardiac Rehab from 02/29/2016 in [facility] Cardiac and Pulmonary Rehab  Date  02/15/16  Educator  [place]  Instruction Review Code (retired)  2- meets goals/outcomes      Depression: - Provides group verbal and written instruction on the correlation between heart/lung disease and depressed mood, treatment options, and the stigmas associated with seeking treatment.   Anatomy & Physiology of the Heart: - Group verbal and written instruction and models provide basic cardiac anatomy and physiology, with the coronary electrical and arterial systems. Review of: AMI, Angina, Valve disease, Heart Failure, Cardiac Arrhythmia, Pacemakers, and the ICD.   Cardiac Procedures: - Group verbal and written instruction to review commonly prescribed medications for heart disease. Reviews the medication, class of the drug, and side effects. Includes the steps to properly store meds and maintain the prescription regimen. (beta blockers and nitrates)   Cardiac Rehab from 10/10/2016 in [facility] Cardiac and Pulmonary Rehab  Date  10/01/16  Educator  [facility]  Instruction Review Code  1- Verbalizes Understanding      Cardiac Medications I: - Group verbal and written instruction to review commonly prescribed medications for heart disease. Reviews the medication, class of the drug, and side effects. Includes the steps to properly store meds and maintain the prescription regimen.   Cardiac Rehab from 10/10/2016 in [facility] Cardiac and Pulmonary Rehab  Date  10/08/16  Educator  [facility]  Instruction Review Code  1- Verbalizes Understanding      Cardiac Medications II: -Group verbal and written instruction to review commonly prescribed medications for heart disease. Reviews the medication, class of the drug, and side effects. (all other drug  classes)    Go Sex-Intimacy & Heart Disease, Get SMART - Goal Setting: - Group verbal and written instruction through game format to discuss heart disease and the return to sexual intimacy. Provides group verbal and written material to discuss and apply goal setting through the application of the S.M.A.R.T. Method.   Cardiac Rehab from 10/10/2016 in [facility] Cardiac and Pulmonary Rehab  Date  10/01/16  Educator  CE  Instruction Review Code  1- Verbalizes Understanding      Other Matters of the Heart: - Provides group verbal, written materials and models to describe Heart Failure, Angina, Valve Disease, Peripheral Artery Disease, and Diabetes in the realm of heart disease. Includes description of the disease process and treatment options available to the cardiac patient.   Exercise & Equipment Safety: - Individual verbal instruction and demonstration of equipment use and safety with use of the equipment.   Cardiac Rehab from 10/10/2016 in [facility] Cardiac and Pulmonary Rehab  Date  09/27/16  Educator  [facility]  Instruction Review Code  1- Verbalizes Understanding      Infection Prevention: - Provides verbal and written material to individual with discussion of infection control including proper hand washing and proper equipment cleaning during exercise session.   Cardiac Rehab from 10/10/2016 in [facility] Cardiac and Pulmonary Rehab  Date  09/27/16  Educator  [facility]  Instruction Review Code  1- Verbalizes Understanding      Falls Prevention: - Provides verbal and written material to individual with discussion of falls prevention and safety.   Cardiac Rehab from 10/10/2016 in [facility] Cardiac and Pulmonary Rehab  Date  09/27/16  Educator  [facility]  Instruction Review Code  1- Verbalizes Understanding      Diabetes: - Individual verbal and written instruction to review signs/symptoms of diabetes, desired ranges of glucose level fasting, after meals and with exercise. Acknowledge that pre and post exercise  glucose checks will be done for 3 sessions at  entry of program.   Other: -Provides group and verbal instruction on various topics (see comments)    Knowledge Questionnaire Score:     Knowledge Questionnaire Score - 09/27/16 1421      Knowledge Questionnaire Score   Pre Score 27/28  Correct answers reviewed with Ed      Core Components/Risk Factors/Patient Goals at Admission:     Personal Goals and Risk Factors at Admission - 09/27/16 1403      Core Components/Risk Factors/Patient Goals on Admission   Heart Failure Yes   Intervention Provide a combined exercise and nutrition program that is supplemented with education, support and counseling about heart failure. Directed toward relieving symptoms such as shortness of breath, decreased exercise tolerance, and extremity edema.   Expected Outcomes Improve functional capacity of life;Short term: Attendance in program 2-3 days a week with increased exercise capacity. Reported lower sodium intake. Reported increased fruit and vegetable intake. Reports medication compliance.;Short term: Daily weights obtained and reported for increase. Utilizing diuretic protocols set by physician.;Long term: Adoption of self-care skills and reduction of barriers for early signs and symptoms recognition and intervention leading to self-care maintenance.   Hypertension Yes   Intervention Provide education on lifestyle modifcations including regular physical activity/exercise, weight management, moderate sodium restriction and increased consumption of fresh fruit, vegetables, and low fat dairy, alcohol moderation, and smoking cessation.;Monitor prescription use compliance.   Expected Outcomes Short Term: Continued assessment and intervention until BP is < 140/37m HG in hypertensive participants. < 130/875mHG in hypertensive participants with diabetes, heart failure or chronic kidney disease.;Long Term: Maintenance of blood pressure at goal levels.   Lipids Yes    Intervention Provide education and support for participant on nutrition & aerobic/resistive exercise along with prescribed medications to achieve LDL '70mg'$ , HDL >'40mg'$ .   Expected Outcomes Short Term: Participant states understanding of desired cholesterol values and is compliant with medications prescribed. Participant is following exercise prescription and nutrition guidelines.;Long Term: Cholesterol controlled with medications as prescribed, with individualized exercise RX and with personalized nutrition plan. Value goals: LDL < '70mg'$ , HDL > 40 mg.      Core Components/Risk Factors/Patient Goals Review:    Core Components/Risk Factors/Patient Goals at Discharge (Final Review):    ITP Comments:     ITP Comments    Row Name 09/27/16 1350 10/05/16 0944 10/17/16 0547       ITP Comments Med Review completed. Initial ITP created. [place] [facility] Ed reports that his MD started him on a heart monitor for history of afib. Ed reports that he has not felt any of his afib lately. Cardiac Rehab monitor today showed Sinus Rhythm and Sinus Tachycardia only. Ed reported his furosemide, Amiodarone, Aspirin but to cont on Brilanta an Eliquis. 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 It is still sore rarely at times but nothing like it was. 30 Day Review continue with ITP unless directed changes per Medical Director Review.         Comments:

## 2016-10-17 NOTE — Progress Notes (Signed)
Daily Session Note  Patient Details  Name: DORON SHAKE MRN: [id number] Date of Birth: Mar 14, 1955 Referring Provider:     Cardiac Rehab from 09/27/2016 in [facility] Cardiac and Pulmonary Rehab  Referring Provider  Henke      Encounter Date: 10/17/2016  Check In:     Session Check In - 10/17/16 0757      Check-In   Location [facility]-Cardiac & Pulmonary Rehab   Staff Present Nyoka Cowden, RN, BSN, Willette Pa, MA, ACSM RCEP, Exercise Physiologist;Miray Mancino Flavia Shipper   Supervising physician immediately available to respond to emergencies See telemetry face sheet for immediately available ER MD   Medication changes reported     No   Fall or balance concerns reported    No   Warm-up and Cool-down Performed on first and last piece of equipment   Resistance Training Performed Yes   VAD Patient? No     Pain Assessment   Currently in Pain? No/denies   Multiple Pain Sites No         History  Smoking Status  . Former Smoker  . Types: Pipe  . Quit date: 1982  Smokeless Tobacco  . Never Used    Goals Met:  Independence with exercise equipment Exercise tolerated well No report of cardiac concerns or symptoms Strength training completed today  Goals Unmet:  Not Applicable  Comments: Pt able to follow exercise prescription today without complaint.  Will continue to monitor for progression.   Dr. Emily Filbert is Medical Director for [place] and [facility].

## 2016-10-19 ENCOUNTER — Telehealth: Payer: Self-pay | Admitting: *Deleted

## 2016-10-19 ENCOUNTER — Encounter: Payer: Self-pay | Admitting: *Deleted
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
## 2016-10-22 ENCOUNTER — Encounter: Payer: Managed Care, Other (non HMO) | Admitting: *Deleted

## 2016-10-22 DIAGNOSIS — I213 ST elevation (STEMI) myocardial infarction of unspecified site: Secondary | ICD-10-CM

## 2016-10-22 DIAGNOSIS — Z955 Presence of coronary angioplasty implant and graft: Secondary | ICD-10-CM

## 2016-10-22 DIAGNOSIS — Z951 Presence of aortocoronary bypass graft: Secondary | ICD-10-CM

## 2016-10-22 DIAGNOSIS — Z48812 Encounter for surgical aftercare following surgery on the circulatory system: Secondary | ICD-10-CM | POA: Diagnosis not present

## 2016-10-22 NOTE — Progress Notes (Signed)
Daily Session Note  Patient Details  Name: Shawn Elliott MRN: [id number] Date of Birth: 04/25/1955 Referring Provider:     Cardiac Rehab from 09/27/2016 in [facility] Cardiac and Pulmonary Rehab  Referring Provider  Henke      Encounter Date: 10/22/2016  Check In:     Session Check In - 10/22/16 0814      Check-In   Location [facility]-Cardiac & Pulmonary Rehab   Staff Present Kelly Hayes, BS, ACSM CEP, Exercise Physiologist;Laureen Brown, BS, RRT, Respiratory Therapist;Carroll Enterkin, RN, BSN   Supervising physician immediately available to respond to emergencies See telemetry face sheet for immediately available ER MD   Medication changes reported     No   Fall or balance concerns reported    No   Warm-up and Cool-down Performed on first and last piece of equipment   Resistance Training Performed Yes   VAD Patient? No     Pain Assessment   Currently in Pain? No/denies   Multiple Pain Sites No         History  Smoking Status  . Former Smoker  . Types: Pipe  . Quit date: 1982  Smokeless Tobacco  . Never Used    Goals Met:  Independence with exercise equipment Exercise tolerated well No report of cardiac concerns or symptoms Strength training completed today  Goals Unmet:  Not Applicable  Comments: Pt able to follow exercise prescription today without complaint.  Will continue to monitor for progression.    Dr. Mark Miller is Medical Director for [facility] and [facility]. 

## 2016-10-24 DIAGNOSIS — Z955 Presence of coronary angioplasty implant and graft: Secondary | ICD-10-CM

## 2016-10-24 DIAGNOSIS — Z48812 Encounter for surgical aftercare following surgery on the circulatory system: Secondary | ICD-10-CM | POA: Diagnosis not present

## 2016-10-24 DIAGNOSIS — I213 ST elevation (STEMI) myocardial infarction of unspecified site: Secondary | ICD-10-CM

## 2016-10-24 DIAGNOSIS — Z951 Presence of aortocoronary bypass graft: Secondary | ICD-10-CM

## 2016-10-24 NOTE — Progress Notes (Signed)
Daily Session Note  Patient Details  Name: Shawn Elliott MRN: [id number] Date of Birth: [birth year] Referring Provider:     Cardiac Rehab from 09/27/2016 in [facility] Cardiac and Pulmonary Rehab  Referring Provider  Henke      Encounter Date: 10/24/2016  Check In:     Session Check In - 10/24/16 1623      Check-In   Location [facility]-Cardiac & Pulmonary Rehab   Staff Present Gerlene Burdock, RN, Vickki Hearing, BA, ACSM CEP, Exercise Physiologist;Other  [facility] physician immediately available to respond to emergencies See telemetry face sheet for immediately available ER MD   Medication changes reported     No   Fall or balance concerns reported    No   Warm-up and Cool-down Performed on first and last piece of equipment   Resistance Training Performed Yes   VAD Patient? No     Pain Assessment   Currently in Pain? No/denies           Exercise Prescription Changes - 10/24/16 1400      Response to Exercise   Blood Pressure (Admit) 108/66   Blood Pressure (Exercise) 138/76   Blood Pressure (Exit) 104/70   Heart Rate (Admit) 60 bpm   Heart Rate (Exercise) 78 bpm   Heart Rate (Exit) 72 bpm   Rating of Perceived Exertion (Exercise) 12   Symptoms none   Duration Continue with 45 min of aerobic exercise without signs/symptoms of physical distress.   Intensity THRR unchanged     Progression   Progression Continue to progress workloads to maintain intensity without signs/symptoms of physical distress.   Average METs 2.35     Resistance Training   Training Prescription Yes   Weight 3   Reps 10-15     Interval Training   Interval Training No     NuStep   METs 2.5     T5 Nustep   Level 3   SPM 80   Minutes 15   METs 2.2     Home Exercise Plan   Plans to continue exercise at Home (comment)  walking   Frequency Add 3 additional days to program exercise sessions.  already walks 30 min per day most days   Initial Home Exercises Provided  10/05/16      History  Smoking Status  . Former Smoker  . Types: Pipe  . Quit date: 1982  Smokeless Tobacco  . Never Used    Goals Met:  Independence with exercise equipment Exercise tolerated well No report of cardiac concerns or symptoms Strength training completed today  Goals Unmet:  Not Applicable  Comments: Pt able to follow exercise prescription today without complaint.  Will continue to monitor for progression.    Dr. Emily Filbert is Medical Director for [place] and [facility].

## 2016-10-26 ENCOUNTER — Encounter: Payer: Self-pay | Admitting: *Deleted

## 2016-10-31 DIAGNOSIS — Z951 Presence of aortocoronary bypass graft: Secondary | ICD-10-CM

## 2016-10-31 DIAGNOSIS — Z48812 Encounter for surgical aftercare following surgery on the circulatory system: Secondary | ICD-10-CM | POA: Diagnosis not present

## 2016-10-31 NOTE — Progress Notes (Signed)
Daily Session Note  Patient Details  Name: JAYMES HANG MRN: [id number] Date of Birth: 1955/02/27 Referring Provider:     Cardiac Rehab from 09/27/2016 in [facility] Cardiac and Pulmonary Rehab  Referring Provider  Henke      Encounter Date: 10/31/2016  Check In:     Session Check In - 10/31/16 0731      Check-In   Location [facility]-Cardiac & Pulmonary Rehab   Staff Present Heath Lark, RN, BSN, CCRP;Jessica Luan Pulling, MA, ACSM RCEP, Exercise Physiologist;Birtha Hatler Flavia Shipper   Supervising physician immediately available to respond to emergencies See telemetry face sheet for immediately available ER MD   Medication changes reported     No   Fall or balance concerns reported    No   Warm-up and Cool-down Performed on first and last piece of equipment   Resistance Training Performed Yes   VAD Patient? No     Pain Assessment   Currently in Pain? No/denies   Multiple Pain Sites No         History  Smoking Status  . Former Smoker  . Types: Pipe  . Quit date: 1982  Smokeless Tobacco  . Never Used    Goals Met:  Independence with exercise equipment Exercise tolerated well No report of cardiac concerns or symptoms Strength training completed today  Goals Unmet:  Not Applicable  Comments: Pt able to follow exercise prescription today without complaint.  Will continue to monitor for progression.   Dr. Emily Filbert is Medical Director for [place] and [facility].

## 2016-11-02 ENCOUNTER — Encounter: Payer: Managed Care, Other (non HMO) | Admitting: *Deleted

## 2016-11-02 DIAGNOSIS — Z951 Presence of aortocoronary bypass graft: Secondary | ICD-10-CM

## 2016-11-02 DIAGNOSIS — Z48812 Encounter for surgical aftercare following surgery on the circulatory system: Secondary | ICD-10-CM | POA: Diagnosis not present

## 2016-11-02 NOTE — Progress Notes (Signed)
Daily Session Note  Patient Details  Name: Shawn Elliott MRN: [id number] Date of Birth: 08-28-1955 Referring Provider:     Cardiac Rehab from 09/27/2016 in [facility] Cardiac and Pulmonary Rehab  Referring Provider  Henke      Encounter Date: 11/02/2016  Check In:     Session Check In - 11/02/16 0800      Check-In   Location [facility]-Cardiac & Pulmonary Rehab   Staff Present Alberteen Sam, MA, ACSM RCEP, Exercise Physiologist;Amanda Oletta Darter, BA, ACSM CEP, Exercise Physiologist;Meredith Sherryll Burger, RN BSN   Supervising physician immediately available to respond to emergencies See telemetry face sheet for immediately available ER MD   Medication changes reported     Yes   Comments d/c'd elliquis and added '81mg'$  ASA, lisinopril increased to '5mg'$  daily, metroprolol changed to once a day   Fall or balance concerns reported    No   Warm-up and Cool-down Performed on first and last piece of equipment   Resistance Training Performed Yes   VAD Patient? No     Pain Assessment   Currently in Pain? No/denies         History  Smoking Status  . Former Smoker  . Types: Pipe  . Quit date: 1982  Smokeless Tobacco  . Never Used    Goals Met:  Independence with exercise equipment Exercise tolerated well Personal goals reviewed No report of cardiac concerns or symptoms Strength training completed today  Goals Unmet:  Not Applicable  Comments: Pt able to follow exercise prescription today without complaint.  Will continue to monitor for progression. See ITP for goal review   Dr. Emily Filbert is Medical Director for [place] and [facility].

## 2016-11-05 DIAGNOSIS — Z951 Presence of aortocoronary bypass graft: Secondary | ICD-10-CM

## 2016-11-05 DIAGNOSIS — Z955 Presence of coronary angioplasty implant and graft: Secondary | ICD-10-CM

## 2016-11-05 DIAGNOSIS — Z48812 Encounter for surgical aftercare following surgery on the circulatory system: Secondary | ICD-10-CM | POA: Diagnosis not present

## 2016-11-05 DIAGNOSIS — I213 ST elevation (STEMI) myocardial infarction of unspecified site: Secondary | ICD-10-CM

## 2016-11-05 NOTE — Progress Notes (Signed)
Daily Session Note  Patient Details  Name: Shawn Elliott MRN: [id number] Date of Birth: 07-Jun-1955 Referring Provider:     Cardiac Rehab from 09/27/2016 in [facility] Cardiac and Pulmonary Rehab  Referring Provider  Henke      Encounter Date: 11/05/2016  Check In:     Session Check In - 11/05/16 1736      Check-In   Location [facility]-Cardiac & Pulmonary Rehab   Staff Present Nada Maclachlan, BA, ACSM CEP, Exercise Physiologist;Kelly Amedeo Plenty, BS, ACSM CEP, Exercise Physiologist;Meredith Sherryll Burger, RN BSN   Supervising physician immediately available to respond to emergencies See telemetry face sheet for immediately available ER MD   Medication changes reported     No   Fall or balance concerns reported    No   Warm-up and Cool-down Performed on first and last piece of equipment   Resistance Training Performed Yes   VAD Patient? No     Pain Assessment   Currently in Pain? No/denies   Multiple Pain Sites No         History  Smoking Status  . Former Smoker  . Types: Pipe  . Quit date: 1982  Smokeless Tobacco  . Never Used    Goals Met:  Independence with exercise equipment Exercise tolerated well No report of cardiac concerns or symptoms Strength training completed today  Goals Unmet:  Not Applicable  Comments: Pt able to follow exercise prescription today without complaint.  Will continue to monitor for progression.    Dr. Emily Filbert is Medical Director for [place] and [facility].

## 2016-11-07 DIAGNOSIS — Z951 Presence of aortocoronary bypass graft: Secondary | ICD-10-CM

## 2016-11-07 DIAGNOSIS — Z48812 Encounter for surgical aftercare following surgery on the circulatory system: Secondary | ICD-10-CM | POA: Diagnosis not present

## 2016-11-07 NOTE — Progress Notes (Signed)
Daily Session Note  Patient Details  Name: YAKUB LODES MRN: [id number] Date of Birth: 17-Aug-1955 Referring Provider:     Cardiac Rehab from 09/27/2016 in [facility] Cardiac and Pulmonary Rehab  Referring Provider  Henke      Encounter Date: 11/07/2016  Check In:     Session Check In - 11/07/16 0757      Check-In   Location [facility]-Cardiac & Pulmonary Rehab   Staff Present Gerlene Burdock, RN, Levie Heritage, MA, ACSM RCEP, Exercise Physiologist;Jadarion Halbig Flavia Shipper   Supervising physician immediately available to respond to emergencies See telemetry face sheet for immediately available ER MD   Medication changes reported     No   Fall or balance concerns reported    No   Warm-up and Cool-down Performed on first and last piece of equipment   Resistance Training Performed Yes   VAD Patient? No     Pain Assessment   Currently in Pain? No/denies         History  Smoking Status  . Former Smoker  . Types: Pipe  . Quit date: 1982  Smokeless Tobacco  . Never Used    Goals Met:  Independence with exercise equipment Exercise tolerated well No report of cardiac concerns or symptoms Strength training completed today  Goals Unmet:  Not Applicable  Comments: Pt able to follow exercise prescription today without complaint.  Will continue to monitor for progression.   Dr. Emily Filbert is Medical Director for [place] and [facility].

## 2016-11-09 ENCOUNTER — Encounter: Payer: Managed Care, Other (non HMO) | Attending: Internal Medicine

## 2016-11-09 DIAGNOSIS — Z951 Presence of aortocoronary bypass graft: Secondary | ICD-10-CM | POA: Diagnosis not present

## 2016-11-09 DIAGNOSIS — I509 Heart failure, unspecified: Secondary | ICD-10-CM | POA: Insufficient documentation

## 2016-11-09 DIAGNOSIS — Z48812 Encounter for surgical aftercare following surgery on the circulatory system: Secondary | ICD-10-CM | POA: Insufficient documentation

## 2016-11-09 DIAGNOSIS — Z955 Presence of coronary angioplasty implant and graft: Secondary | ICD-10-CM

## 2016-11-09 DIAGNOSIS — I213 ST elevation (STEMI) myocardial infarction of unspecified site: Secondary | ICD-10-CM

## 2016-11-09 NOTE — Progress Notes (Signed)
Daily Session Note  Patient Details  Name: Shawn Elliott MRN: [id number] Date of Birth: 06-Jan-1956 Referring Provider:     Cardiac Rehab from 09/27/2016 in [facility] Cardiac and Pulmonary Rehab  Referring Provider  Henke      Encounter Date: 11/09/2016  Check In:     Session Check In - 11/09/16 0815      Check-In   Location [facility]-Cardiac & Pulmonary Rehab   Staff Present Heath Lark, RN, BSN, CCRP;Jessica Luan Pulling, MA, ACSM RCEP, Exercise Physiologist;Hibah Odonnell Oletta Darter, [place], ACSM CEP, Exercise Physiologist   Supervising physician immediately available to respond to emergencies See telemetry face sheet for immediately available ER MD   Medication changes reported     No   Fall or balance concerns reported    No   Warm-up and Cool-down Performed on first and last piece of equipment   Resistance Training Performed Yes   VAD Patient? No     Pain Assessment   Currently in Pain? No/denies   Multiple Pain Sites No         History  Smoking Status  . Former Smoker  . Types: Pipe  . Quit date: 1982  Smokeless Tobacco  . Never Used    Goals Met:  Independence with exercise equipment Exercise tolerated well No report of cardiac concerns or symptoms Strength training completed today  Goals Unmet:  Not Applicable  Comments: Pt able to follow exercise prescription today without complaint.  Will continue to monitor for progression.    Dr. Emily Filbert is Medical Director for [place] and [facility].

## 2016-11-14 ENCOUNTER — Encounter: Payer: Self-pay | Admitting: *Deleted

## 2016-11-14 DIAGNOSIS — I213 ST elevation (STEMI) myocardial infarction of unspecified site: Secondary | ICD-10-CM

## 2016-11-14 DIAGNOSIS — Z951 Presence of aortocoronary bypass graft: Secondary | ICD-10-CM
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
## 2016-11-19 ENCOUNTER — Encounter: Payer: Self-pay | Admitting: *Deleted

## 2016-11-19 ENCOUNTER — Telehealth: Payer: Self-pay | Admitting: *Deleted

## 2016-11-19 DIAGNOSIS — Z951 Presence of aortocoronary bypass graft: Secondary | ICD-10-CM

## 2016-11-19 NOTE — Progress Notes (Deleted)
Cardiac Individual Treatment Plan  Patient Details  Name: Shawn Elliott MRN: [id number] Date of Birth: 15-May-1955 Referring Provider:     Cardiac Rehab from 09/27/2016 in [facility] Cardiac and Pulmonary Rehab  Referring Provider  Henke      Initial Encounter Date:    Cardiac Rehab from 09/27/2016 in [facility] Cardiac and Pulmonary Rehab  Date  09/27/16  Referring Provider  Henke      Visit Diagnosis: S/P CABG x 4  Patient's Home Medications on Admission:  Current Outpatient Medications:  .  albuterol (PROVENTIL HFA) 108 (90 Base) MCG/ACT inhaler, Inhale 2 puffs into the lungs every 4 (four) hours as needed for wheezing or shortness of breath. (Patient not taking: Reported on 09/27/2016), Disp: 1 Inhaler, Rfl: 0 .  amiodarone (PACERONE) 200 MG tablet, Take by mouth., Disp: , Rfl:  .  apixaban (ELIQUIS) 5 MG TABS tablet, Take by mouth., Disp: , Rfl:  .  aspirin EC 81 MG tablet, Take 81 mg by mouth daily., Disp: , Rfl:  .  atorvastatin (LIPITOR) 80 MG tablet, Take 1 tablet (80 mg total) by mouth daily at 6 PM. (Patient taking differently: Take 80 mg by mouth at bedtime. ), Disp: 90 tablet, Rfl: 3 .  ibuprofen (ADVIL,MOTRIN) 200 MG tablet, Take 400 mg by mouth every 6 (six) hours as needed (pain)., Disp: , Rfl:  .  isosorbide mononitrate (IMDUR) 30 MG 24 hr tablet, Take 1 tablet (30 mg total) by mouth daily., Disp: 90 tablet, Rfl: 3 .  lisinopril (PRINIVIL,ZESTRIL) 2.5 MG tablet, Take 1 tablet (2.5 mg total) by mouth daily., Disp: 90 tablet, Rfl: 3 .  metoprolol tartrate (LOPRESSOR) 25 MG tablet, Take 0.5 tablets (12.5 mg total) by mouth 2 (two) times daily., Disp: 90 tablet, Rfl: 3 .  nitroGLYCERIN (NITROSTAT) 0.4 MG SL tablet, Place 1 tablet (0.4 mg total) under the tongue every 5 (five) minutes as needed for chest pain. (Patient not taking: Reported on 09/27/2016), Disp: 25 tablet, Rfl: 2 .  omeprazole (PRILOSEC) 20 MG capsule, Take 20 mg by mouth daily., Disp: , Rfl:  .  predniSONE (DELTASONE)  20 MG tablet, Take 2 tablets (40 mg total) by mouth daily. (Patient not taking: Reported on 09/27/2016), Disp: 8 tablet, Rfl: 0 .  ticagrelor (BRILINTA) 90 MG TABS tablet, Take 1 tablet (90 mg total) by mouth 2 (two) times daily., Disp: 180 tablet, Rfl: 3  Past Medical History: Past Medical History:  Diagnosis Date  . Coronary artery disease   . GERD (gastroesophageal reflux disease)   . High cholesterol   . History of kidney stones    "passed them" (01/17/2016)  . Hypertension   . PONV (postoperative nausea and vomiting)    "when I woke up from my hernia I was nauseated"  . STEMI (ST elevation myocardial infarction) ([place]) 01/12/2016    Tobacco Use: Social History   Tobacco Use  Smoking Status Former Smoker  . Types: Pipe  . Last attempt to quit: 1982  . Years since quitting: 36.8  Smokeless Tobacco Never Used    Labs: Recent Chemical engineer    Labs for ITP Cardiac and Pulmonary Rehab Latest Ref Rng & Units 01/12/2016 03/17/2016 04/20/2016   Cholestrol 100 - 199 mg/dL 266(H) 97 111   LDLCALC 0 - 99 mg/dL 182(H) 50 56   HDL >39 mg/dL 47 25(L) 34(L)   Trlycerides 0 - 149 mg/dL 183(H) 112 107   Hemoglobin A1c 4.8 - 5.6 % 5.4 - -   TCO2  0 - 100 mmol/L 21 - -       Exercise Target Goals:    Exercise Program Goal: Individual exercise prescription set with THRR, safety & activity barriers. Participant demonstrates ability to understand and report RPE using BORG scale, to self-measure pulse accurately, and to acknowledge the importance of the exercise prescription.  Exercise Prescription Goal: Starting with aerobic activity 30 plus minutes a day, 3 days per week for initial exercise prescription. Provide home exercise prescription and guidelines that participant acknowledges understanding prior to discharge.  Activity Barriers & Risk Stratification: Activity Barriers & Cardiac Risk Stratification - 09/27/16 1426      Activity Barriers & Cardiac Risk Stratification    Activity Barriers  Shortness of Breath Still gets short of breath when bending over or with certain activities   Still gets short of breath when bending over or with certain activities   Cardiac Risk Stratification  High       6 Minute Walk: 6 Minute Walk    Row Name 09/27/16 1422         6 Minute Walk   Distance  1300 feet     Walk Time  6 minutes     # of Rest Breaks  0     MPH  2.46     METS  3.42     RPE  9     Perceived Dyspnea   1     VO2 Peak  11.99     Symptoms  No     Resting HR  56 bpm     Resting BP  126/78     Resting Oxygen Saturation   97 %     Exercise Oxygen Saturation  during 6 min walk  97 %     Max Ex. HR  97 bpm     Max Ex. BP  138/76     2 Minute Post BP  134/70        Oxygen Initial Assessment: Oxygen Initial Assessment - 10/17/16 0612      Home Oxygen   Home Oxygen Device  None      Initial 6 min Walk   Oxygen Used  None      Program Oxygen Prescription   Program Oxygen Prescription  None       Oxygen Re-Evaluation: Oxygen Re-Evaluation    Row Name 10/17/16 0612             Home Oxygen   Sleep Oxygen Prescription  None       Home Exercise Oxygen Prescription  None       Home at Rest Exercise Oxygen Prescription  None          Oxygen Discharge (Final Oxygen Re-Evaluation): Oxygen Re-Evaluation - 10/17/16 0612      Home Oxygen   Sleep Oxygen Prescription  None    Home Exercise Oxygen Prescription  None    Home at Rest Exercise Oxygen Prescription  None       Initial Exercise Prescription: Initial Exercise Prescription - 09/27/16 1400      Date of Initial Exercise RX and Referring Provider   Date  09/27/16    Referring Provider  Henke      Treadmill   MPH  2.4    Grade  1.5    Minutes  15    METs  3.33      Recumbant Bike   Level  5    RPM  60    Watts  45  Minutes  15    METs  3.35      REL-XR   Level  3    Speed  50    Minutes  15    METs  3.3      T5 Nustep   Level  3    SPM  80    Minutes  15     METs  3.3      Prescription Details   Frequency (times per week)  3    Duration  Progress to 45 minutes of aerobic exercise without signs/symptoms of physical distress      Intensity   THRR 40-80% of Max Heartrate  97-139    Ratings of Perceived Exertion  11-13    Perceived Dyspnea  0-4      Resistance Training   Training Prescription  Yes    Weight  3    Reps  10-15       Perform Capillary Blood Glucose checks as needed.  Exercise Prescription Changes: Exercise Prescription Changes    Row Name 09/27/16 1400 10/05/16 0900 10/10/16 1500 10/24/16 1400 11/07/16 1400     Response to Exercise   Blood Pressure (Admit)  126/78  -  126/64  108/66  108/62   Blood Pressure (Exercise)  138/76  -  126/66  138/76  128/76   Blood Pressure (Exit)  134/70  -  110/68  104/70  126/66   Heart Rate (Admit)  66 bpm  -  57 bpm  60 bpm  62 bpm   Heart Rate (Exercise)  97 bpm  -  91 bpm  78 bpm  100 bpm   Heart Rate (Exit)  63 bpm  -  81 bpm  72 bpm  80 bpm   Oxygen Saturation (Admit)  97 %  -  -  -  -   Oxygen Saturation (Exit)  97 %  -  -  -  -   Rating of Perceived Exertion (Exercise)  9  -  _0 Symptoms  -  -  none  none  none   Duration  -  -  Continue with 45 min of aerobic exercise without signs/symptoms of physical distress.  Continue with 45 min of aerobic exercise without signs/symptoms of physical distress.  Continue with 45 min of aerobic exercise without signs/symptoms of physical distress.   Intensity  -  -  THRR unchanged  THRR unchanged  THRR unchanged     Progression   Progression  -  -  Continue to progress workloads to maintain intensity without signs/symptoms of physical distress.  Continue to progress workloads to maintain intensity without signs/symptoms of physical distress.  Continue to progress workloads to maintain intensity without signs/symptoms of physical distress.   Average METs  -  -  3.08  2.35  3.59     Resistance Training   Training Prescription  -   -  Yes  Yes  Yes   Weight  -  -  _1 lbs   Reps  -  -  10-15  10-15  10-15     Interval Training   Interval Training  -  -  No  No  No     Treadmill   MPH  -  -  2.4  -  3   Grade  -  -  1.5  -  3   Minutes  -  -  15  -  15   METs  -  -  3.33  -  4.52     Recumbant Bike   Level  -  -  6  -  6   Watts  -  -  46  -  46   Minutes  -  -  15  -  15   METs  -  -  3.42  -  3.42     NuStep   METs  -  -  -  2.5  -     T5 Nustep   Level  -  -  _0 SPM  -  -  -  80  -   Minutes  -  -  _1 METs  -  -  2.5  2.2  2.8     Home Exercise Plan   Plans to continue exercise at  -  Home (comment) walking  Home (comment) walking  Home (comment) walking  Home (comment) walking   Frequency  -  Add 3 additional days to program exercise sessions. already walks 30 min per day most days  Add 3 additional days to program exercise sessions. already walks 30 min per day most days  Add 3 additional days to program exercise sessions. already walks 30 min per day most days  Add 3 additional days to program exercise sessions. already walks 30 min per day most days   Initial Home Exercises Provided  -  10/05/16  10/05/16  10/05/16  10/05/16   Row Name 11/19/16 1200             Response to Exercise   Blood Pressure (Admit)  122/64       Blood Pressure (Exercise)  134/70       Blood Pressure (Exit)  126/64       Heart Rate (Admit)  58 bpm       Heart Rate (Exercise)  92 bpm       Heart Rate (Exit)  76 bpm       Rating of Perceived Exertion (Exercise)  14       Symptoms  none       Duration  Continue with 45 min of aerobic exercise without signs/symptoms of physical distress.       Intensity  THRR unchanged         Progression   Progression  Continue to progress workloads to maintain intensity without signs/symptoms of physical distress.       Average METs  4.44         Resistance Training   Training Prescription  Yes       Weight  4 lbs       Reps  10-15         Interval  Training   Interval Training  Yes       Equipment  NuStep;Recumbant Bike       Comments  27mn off 30 sec on         Treadmill   MPH  3       Grade  3       Minutes  15       METs  4.52         Recumbant Bike   Level  6       Watts  52       Minutes  15       METs  3.56  T5 Nustep   Level  3       Minutes  15       METs  5.2         Home Exercise Plan   Plans to continue exercise at  Home (comment) walking       Frequency  Add 3 additional days to program exercise sessions. already walks 30 min per day most days       Initial Home Exercises Provided  10/05/16          Exercise Comments: Exercise Comments    Row Name 10/01/16 0818           Exercise Comments  First full day of exercise!  Patient was oriented to gym and equipment including functions, settings, policies, and procedures.  Patient's individual exercise prescription and treatment plan were reviewed.  All starting workloads were established based on the results of the 6 minute walk test done at initial orientation visit.  The plan for exercise progression was also introduced and progression will be customized based on patient's performance and goals.          Exercise Goals and Review: Exercise Goals    Row Name 09/27/16 1422             Exercise Goals   Increase Physical Activity  Yes       Intervention  Provide advice, education, support and counseling about physical activity/exercise needs.;Develop an individualized exercise prescription for aerobic and resistive training based on initial evaluation findings, risk stratification, comorbidities and participant's personal goals.       Expected Outcomes  Achievement of increased cardiorespiratory fitness and enhanced flexibility, muscular endurance and strength shown through measurements of functional capacity and personal statement of participant.       Increase Strength and Stamina  Yes       Intervention  Provide advice, education, support and  counseling about physical activity/exercise needs.;Develop an individualized exercise prescription for aerobic and resistive training based on initial evaluation findings, risk stratification, comorbidities and participant's personal goals.       Expected Outcomes  Achievement of increased cardiorespiratory fitness and enhanced flexibility, muscular endurance and strength shown through measurements of functional capacity and personal statement of participant.       Able to understand and use rate of perceived exertion (RPE) scale  Yes       Intervention  Provide education and explanation on how to use RPE scale       Expected Outcomes  Short Term: Able to use RPE daily in rehab to express subjective intensity level;Long Term:  Able to use RPE to guide intensity level when exercising independently       Able to understand and use Dyspnea scale  Yes       Intervention  Provide education and explanation on how to use Dyspnea scale       Expected Outcomes  Short Term: Able to use Dyspnea scale daily in rehab to express subjective sense of shortness of breath during exertion;Long Term: Able to use Dyspnea scale to guide intensity level when exercising independently       Knowledge and understanding of Target Heart Rate Range (THRR)  Yes       Intervention  Provide education and explanation of THRR including how the numbers were predicted and where they are located for reference       Expected Outcomes  Short Term: Able to state/look up THRR;Long Term: Able to use THRR to govern intensity  when exercising independently;Short Term: Able to use daily as guideline for intensity in rehab       Able to check pulse independently  Yes       Intervention  Provide education and demonstration on how to check pulse in carotid and radial arteries.;Review the importance of being able to check your own pulse for safety during independent exercise       Expected Outcomes  Short Term: Able to explain why pulse checking is  important during independent exercise;Long Term: Able to check pulse independently and accurately       Understanding of Exercise Prescription  Yes       Intervention  Provide education, explanation, and written materials on patient's individual exercise prescription       Expected Outcomes  Short Term: Able to explain program exercise prescription;Long Term: Able to explain home exercise prescription to exercise independently          Exercise Goals Re-Evaluation : Exercise Goals Re-Evaluation    Row Name 10/01/16 0818 10/05/16 0913 10/05/16 0914 10/10/16 1540 10/24/16 1449     Exercise Goal Re-Evaluation   Exercise Goals Review  Able to understand and use rate of perceived exertion (RPE) scale;Knowledge and understanding of Target Heart Rate Range (THRR);Understanding of Exercise Prescription  Increase Physical Activity;Increase Strength and Stamina;Able to understand and use rate of perceived exertion (RPE) scale;Knowledge and understanding of Target Heart Rate Range (THRR);Able to check pulse independently;Understanding of Exercise Prescription  Increase Physical Activity;Increase Strength and Stamina;Able to understand and use rate of perceived exertion (RPE) scale;Knowledge and understanding of Target Heart Rate Range (THRR);Able to check pulse independently;Understanding of Exercise Prescription  Increase Physical Activity;Increase Strength and Stamina  Increase Physical Activity;Increase Strength and Stamina   Comments  Reviewed RPE scale, THR and program prescription with pt today.  Pt voiced understanding and was given a copy of goals to take home.   -  Reviewed home exercise with pt today.  Pt plans to walk for exercise.  Reviewed THR, pulse, RPE, sign and symptoms, NTG use, and when to call 911 or MD.  Also discussed weather considerations and indoor options.  Pt voiced understanding.  Ed is off to a good start in rehab. He has been able to push himself.  He is already up to 46W on the  recumbent bike.  We will continue to monitor his progression.   Ed continues to tolerate exercise well.  Staff will monitor progress.   Expected Outcomes  Short: Use RPE daily to regulate intensity.  Long: Follow program prescription in THR.  -  Short - Ed will continue walking on days he doesn't attend HT.  He will monitor RPE when walking.  Long - Ed will maintain exercise on his own  Short: Discuss adding in interval training for exercise.  Long: Continue to exercise independently.   Short - Ed will continue to exercise regularly on his own and in class.  Long - Ed will continue to increase workloads in class.   [place] Name 11/02/16 0756 11/07/16 1446 11/19/16 1258         Exercise Goal Re-Evaluation   Exercise Goals Review  Increase Physical Activity;Increase Strength and Stamina;Understanding of Exercise Prescription  -  Increase Physical Activity;Increase Strength and Stamina;Understanding of Exercise Prescription     Comments  Ed has been doing well in rehab.  He is exercising at home twice a day for a total of an hour walking on the road with a hill.  It has  been going well.  He feels that his strength and stamina are getting better.  He has also noticed that he is breathing better and able to do more at home.   He only has 28 sessions this go around.   -  Ed has been out sick and only attended once since last review.  We will try to get him back up to speed once he returns.     Expected Outcomes  Short: Add in intervals to workouts.  Long: Continue to exercise independently on off days.   -  Short: Return to rehab regularly.  Long: Continue to exercise to increase strength and stamina        Discharge Exercise Prescription (Final Exercise Prescription Changes): Exercise Prescription Changes - 11/19/16 1200      Response to Exercise   Blood Pressure (Admit)  122/64    Blood Pressure (Exercise)  134/70    Blood Pressure (Exit)  126/64    Heart Rate (Admit)  58 bpm    Heart Rate (Exercise)   92 bpm    Heart Rate (Exit)  76 bpm    Rating of Perceived Exertion (Exercise)  14    Symptoms  none    Duration  Continue with 45 min of aerobic exercise without signs/symptoms of physical distress.    Intensity  THRR unchanged      Progression   Progression  Continue to progress workloads to maintain intensity without signs/symptoms of physical distress.    Average METs  4.44      Resistance Training   Training Prescription  Yes    Weight  4 lbs    Reps  10-15      Interval Training   Interval Training  Yes    Equipment  NuStep;Recumbant Bike    Comments  44mn off 30 sec on      Treadmill   MPH  3    Grade  3    Minutes  15    METs  4.52      Recumbant Bike   Level  6    Watts  52    Minutes  15    METs  3.56      T5 Nustep   Level  3    Minutes  15    METs  5.2      Home Exercise Plan   Plans to continue exercise at  Home (comment) walking   walking   Frequency  Add 3 additional days to program exercise sessions. already walks 30 min per day most days   already walks 30 min per day most days   Initial Home Exercises Provided  10/05/16       Nutrition:  Target Goals: Understanding of nutrition guidelines, daily intake of sodium <15057m cholesterol <20062mcalories 30% from fat and 7% or less from saturated fats, daily to have 5 or more servings of fruits and vegetables.  Biometrics: Pre Biometrics - 09/27/16 1413      Pre Biometrics   Height  6' 1" (1.854 m)    Weight  222 lb 14.4 oz (101.1 kg)    Waist Circumference  42 inches    Hip Circumference  42.5 inches    Waist to Hip Ratio  0.99 %    BMI (Calculated)  29.41    Single Leg Stand  16.78 seconds        Nutrition Therapy Plan and Nutrition Goals: Nutrition Therapy & Goals - 11/02/16 [id number]  Nutrition Therapy   RD appointment defered  Yes       Nutrition Discharge: Rate Your Plate Scores: Nutrition Assessments - 09/27/16 1413      MEDFICTS Scores   Pre Score  24       Nutrition  Goals Re-Evaluation: Nutrition Goals Re-Evaluation    Row Name 11/02/16 0807             Goals   Nutrition Goal  Follow heart healthy diet       Comment  Continues to decline nutrition meeting.  He uses his apple watch to track activity levels and tries to make healthy choices.   He is staying on track with his weight.        Expected Outcome  Short: Continue to work on weight control.  Long: Follow heart healthy diet          Nutrition Goals Discharge (Final Nutrition Goals Re-Evaluation): Nutrition Goals Re-Evaluation - 11/02/16 0807      Goals   Nutrition Goal  Follow heart healthy diet    Comment  Continues to decline nutrition meeting.  He uses his apple watch to track activity levels and tries to make healthy choices.   He is staying on track with his weight.     Expected Outcome  Short: Continue to work on weight control.  Long: Follow heart healthy diet       Psychosocial: Target Goals: Acknowledge presence or absence of significant depression and/or stress, maximize coping skills, provide positive support system. Participant is able to verbalize types and ability to use techniques and skills needed for reducing stress and depression.   Initial Review & Psychosocial Screening: Initial Psych Review & Screening - 09/27/16 1417      Initial Review   Current issues with  Current Sleep Concerns Ed reports having trouble staying asleep. What he is currently doing is staying up super late until he can't hold his eyes open then he is actually being able to sleep until [place]   Ed reports having trouble staying asleep. What he is currently doing is staying up super late until he can't hold his eyes open then he is actually being able to sleep until [place]?  Yes Wife, family, friends    Wife, family, friends      Screening Interventions   Interventions  Yes;Encouraged to exercise;Program counselor consult    Expected Outcomes  Short  Term goal: Utilizing psychosocial counselor, staff and physician to assist with identification of specific Stressors or current issues interfering with healing process. Setting desired goal for each stressor or current issue identified.;Long Term Goal: Stressors or current issues are controlled or eliminated.;Short Term goal: Identification and review with participant of any Quality of Life or Depression concerns found by scoring the questionnaire.;Long Term goal: The participant improves quality of Life and PHQ9 Scores as seen by post scores and/or verbalization of changes       Quality of Life Scores:  Quality of Life - 09/27/16 1419      Quality of Life Scores   Health/Function Pre  20.86 %    Socioeconomic Pre  20.63 %    Psych/Spiritual Pre  20.71 %    Family Pre  21 %    GLOBAL Pre  20.79 %       PHQ-9: Recent Review Flowsheet Data    Depression screen [facility] 2/9 09/27/2016 02/06/2016   Decreased Interest 0 0  Down, Depressed, Hopeless 0 0   PHQ - 2 Score 0 0   Altered sleeping 1 0   Tired, decreased energy 1 0   Change in appetite 0 0   Feeling bad or failure about yourself  0 0   Trouble concentrating 0 0   Moving slowly or fidgety/restless 0 0   Suicidal thoughts 0 0   PHQ-9 Score 2 0   Difficult doing work/chores Not difficult at all Not difficult at all     Interpretation of Total Score  Total Score Depression Severity:  1-4 = Minimal depression, 5-9 = Mild depression, 10-14 = Moderate depression, 15-19 = Moderately severe depression, 20-27 = Severe depression   Psychosocial Evaluation and Intervention: Psychosocial Evaluation - 10/01/16 0908      Psychosocial Evaluation & Interventions   Comments  Mr Armato returned today after (7) 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  Counselor met with Ed for initial psychosocial evaluation for this course of Cardiac Rehab.  He is a 61 year old who has a strong support system with a spouse; lots of family; friends and Ed  is actively involved in his local church.  He sleeps well and has a good appetite; although he has lost 52 lbs. since his first heart attack in January of this year.  Ed denies a history of depresion or anxiety or any current symptoms.  He reports being in a positive mood most of the time now that he can drive and engage in more normal activities.  He has some additional stress being out of work and on [place] and his health.  Ed has goals to increase his stamina and strength while in this program to be able to return to work in November.  Staff will follow with Ed throughout the course of this program.     Expected Outcomes  Ed will benefit from consistent exercise to achieve his stated goals.  The educational and psychoeducational components will be helpful to learn and manage his disease better.      Continue Psychosocial Services   Follow up required by staff       Psychosocial Re-Evaluation: Psychosocial Re-Evaluation    Row Name 10/05/16 [phone] 10/05/16 0954 10/19/16 0641 10/26/16 0734 11/02/16 0808     Psychosocial Re-Evaluation   Current issues with  -  -  Current Stress Concerns  -  Current Stress Concerns   Comments  Ed says he feels so much better now that he does not have afib. He states he is not depressed and not anxious.   -  Ed called to say he is sorry that he can not attend Cardiac Rehab today but due to the [facility] he has a tree down in his front yard and he does not have electric power currently.   Ed came to the Cardiac Rehab gym today to ask [place] about the blood in his urine that started last night. Ed is on 2 blood thinners which he did not take this am. 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 He had some afib after his surgery so that is why he is on blood thinner took. I asked him to call his doctors office and speak to the nurse about the blood in his urine  Ed returns to work on Nov 5.  Mentally he is not ready, but  hopes physically that he with stand 8 hours a day  on his feet.  He has started to have some med changes which seem to be working. He continues to sleep well, he is no longer snoring as much.  He has a good support system with his wife.  The weight loss and activity are really helping him feel better overall.  This round, he knew what to expect and was excited about seeing the same staff and knowing what to do and what is expected.   Expected Outcomes  -  Ed does have a good support system. with his wife and family  -  -  Short: Continue to attend his 28 sessions and return to work.  Long: Cope with stress of returning to work.    Interventions  -  -  Encouraged to attend Cardiac Rehabilitation for the exercise  -  Encouraged to attend Cardiac Rehabilitation for the exercise;Stress management education   Continue Psychosocial Services   -  No Follow up required  Follow up required by staff  -  Follow up required by staff     Initial Review   Source of Stress Concerns  -  -  -  -  Chronic Illness;Occupation      Psychosocial Discharge (Final Psychosocial Re-Evaluation): Psychosocial Re-Evaluation - 11/02/16 0808      Psychosocial Re-Evaluation   Current issues with  Current Stress Concerns    Comments  Ed returns to work on Nov 5.  Mentally he is not ready, but hopes physically that he with stand 8 hours a day on his feet.  He has started to have some med changes which seem to be working. He continues to sleep well, he is no longer snoring as much.  He has a good support system with his wife.  The weight loss and activity are really helping him feel better overall.  This round, he knew what to expect and was excited about seeing the same staff and knowing what to do and what is expected.    Expected Outcomes  Short: Continue to attend his 28 sessions and return to work.  Long: Cope with stress of returning to work.     Interventions  Encouraged to attend Cardiac Rehabilitation for the exercise;Stress  management education    Continue Psychosocial Services   Follow up required by staff      Initial Review   Source of Stress Concerns  Chronic Illness;Occupation       Vocational Rehabilitation: Provide vocational rehab assistance to qualifying candidates.   Vocational Rehab Evaluation & Intervention: Vocational Rehab - 09/27/16 1424      Initial Vocational Rehab Evaluation & Intervention   Assessment shows need for Vocational Rehabilitation  No       Education: Education Goals: Education classes will be provided on a variety of topics geared toward better understanding of heart health and risk factor modification. Participant will state understanding/return demonstration of topics presented as noted by education test scores.  Learning Barriers/Preferences: Learning Barriers/Preferences - 09/27/16 1420      Learning Barriers/Preferences   Learning Barriers  None    Learning Preferences  None       Education Topics: General Nutrition Guidelines/Fats and Fiber: -Group instruction provided by verbal, written material, models and posters to present the general guidelines for heart healthy nutrition. Gives an explanation and review of dietary fats and fiber.   Controlling Sodium/Reading Food Labels: -Group verbal and written material supporting the discussion of sodium use in heart healthy nutrition. Review and explanation with models, verbal and  written materials for utilization of the food label.   Exercise Physiology & Risk Factors: - Group verbal and written instruction with models to review the exercise physiology of the cardiovascular system and associated critical values. Details cardiovascular disease risk factors and the goals associated with each risk factor.   Cardiac Rehab from 11/07/2016 in [facility] Cardiac and Pulmonary Rehab  Date  11/05/16  Educator  [facility]  Instruction Review Code  5- Refused Teaching      Aerobic Exercise & Resistance Training: - Gives group  verbal and written discussion on the health impact of inactivity. On the components of aerobic and resistive training programs and the benefits of this training and how to safely progress through these programs.   Cardiac Rehab from 11/07/2016 in [facility] Cardiac and Pulmonary Rehab  Date  11/07/16  Educator  [facility]  Instruction Review Code  1- Verbalizes Understanding      Flexibility, Balance, General Exercise Guidelines: - Provides group verbal and written instruction on the benefits of flexibility and balance training programs. Provides general exercise guidelines with specific guidelines to those with heart or lung disease. Demonstration and skill practice provided.   Stress Management: - Provides group verbal and written instruction about the health risks of elevated stress, cause of high stress, and healthy ways to reduce stress.   Cardiac Rehab from 02/29/2016 in [facility] Cardiac and Pulmonary Rehab  Date  02/15/16  Educator  [facility]  Instruction Review Code (retired)  2- meets goals/outcomes      Depression: - Provides group verbal and written instruction on the correlation between heart/lung disease and depressed mood, treatment options, and the stigmas associated with seeking treatment.   Cardiac Rehab from 11/07/2016 in [facility] Cardiac and Pulmonary Rehab  Date  10/24/16  Educator  [facility]  Instruction Review Code  5- Refused Teaching      Anatomy & Physiology of the Heart: - Group verbal and written instruction and models provide basic cardiac anatomy and physiology, with the coronary electrical and arterial systems. Review of: AMI, Angina, Valve disease, Heart Failure, Cardiac Arrhythmia, Pacemakers, and the ICD.   Cardiac Procedures: - Group verbal and written instruction to review commonly prescribed medications for heart disease. Reviews the medication, class of the drug, and side effects. Includes the steps to properly store meds and maintain the prescription regimen. (beta blockers and  nitrates)   Cardiac Rehab from 11/07/2016 in [facility] Cardiac and Pulmonary Rehab  Date  10/01/16  Educator  CE  Instruction Review Code  1- Verbalizes Understanding      Cardiac Medications I: - Group verbal and written instruction to review commonly prescribed medications for heart disease. Reviews the medication, class of the drug, and side effects. Includes the steps to properly store meds and maintain the prescription regimen.   Cardiac Rehab from 11/07/2016 in [facility] Cardiac and Pulmonary Rehab  Date  10/08/16  Educator  [facility]  Instruction Review Code  1- Verbalizes Understanding      Cardiac Medications II: -Group verbal and written instruction to review commonly prescribed medications for heart disease. Reviews the medication, class of the drug, and side effects. (all other drug classes)    Go Sex-Intimacy & Heart Disease, Get SMART - Goal Setting: - Group verbal and written instruction through game format to discuss heart disease and the return to sexual intimacy. Provides group verbal and written material to discuss and apply goal setting through the application of the S.M.A.R.T. Method.   Cardiac Rehab from 11/07/2016 in [facility] Cardiac and  Pulmonary Rehab  Date  10/01/16  Educator  CE  Instruction Review Code  1- Verbalizes Understanding      Other Matters of the Heart: - Provides group verbal, written materials and models to describe Heart Failure, Angina, Valve Disease, Peripheral Artery Disease, and Diabetes in the realm of heart disease. Includes description of the disease process and treatment options available to the cardiac patient.   Exercise & Equipment Safety: - Individual verbal instruction and demonstration of equipment use and safety with use of the equipment.   Cardiac Rehab from 11/07/2016 in [facility] Cardiac and Pulmonary Rehab  Date  09/27/16  Educator  [facility]  Instruction Review Code  1- Verbalizes Understanding      Infection Prevention: - Provides verbal and  written material to individual with discussion of infection control including proper hand washing and proper equipment cleaning during exercise session.   Cardiac Rehab from 11/07/2016 in [facility] Cardiac and Pulmonary Rehab  Date  09/27/16  Educator  [facility]  Instruction Review Code  1- Verbalizes Understanding      Falls Prevention: - Provides verbal and written material to individual with discussion of falls prevention and safety.   Cardiac Rehab from 11/07/2016 in [facility] Cardiac and Pulmonary Rehab  Date  09/27/16  Educator  [facility]  Instruction Review Code  1- Verbalizes Understanding      Diabetes: - Individual verbal and written instruction to review signs/symptoms of diabetes, desired ranges of glucose level fasting, after meals and with exercise. Acknowledge that pre and post exercise glucose checks will be done for 3 sessions at entry of program.   Other: -Provides group and verbal instruction on various topics (see comments)    Knowledge Questionnaire Score: Knowledge Questionnaire Score - 09/27/16 1421      Knowledge Questionnaire Score   Pre Score  27/28 Correct answers reviewed with Ed   Correct answers reviewed with Ed      Core Components/Risk Factors/Patient Goals at Admission: Personal Goals and Risk Factors at Admission - 09/27/16 1403      Core Components/Risk Factors/Patient Goals on Admission   Heart Failure  Yes    Intervention  Provide a combined exercise and nutrition program that is supplemented with education, support and counseling about heart failure. Directed toward relieving symptoms such as shortness of breath, decreased exercise tolerance, and extremity edema.    Expected Outcomes  Improve functional capacity of life;Short term: Attendance in program 2-3 days a week with increased exercise capacity. Reported lower sodium intake. Reported increased fruit and vegetable intake. Reports medication compliance.;Short term: Daily weights obtained and reported for  increase. Utilizing diuretic protocols set by physician.;Long term: Adoption of self-care skills and reduction of barriers for early signs and symptoms recognition and intervention leading to self-care maintenance.    Hypertension  Yes    Intervention  Provide education on lifestyle modifcations including regular physical activity/exercise, weight management, moderate sodium restriction and increased consumption of fresh fruit, vegetables, and low fat dairy, alcohol moderation, and smoking cessation.;Monitor prescription use compliance.    Expected Outcomes  Short Term: Continued assessment and intervention until BP is < 140/87m HG in hypertensive participants. < 130/817mHG in hypertensive participants with diabetes, heart failure or chronic kidney disease.;Long Term: Maintenance of blood pressure at goal levels.    Lipids  Yes    Intervention  Provide education and support for participant on nutrition & aerobic/resistive exercise along with prescribed medications to achieve LDL <7041mHDL >65m2m  Expected Outcomes  Short Term:  Participant states understanding of desired cholesterol values and is compliant with medications prescribed. Participant is following exercise prescription and nutrition guidelines.;Long Term: Cholesterol controlled with medications as prescribed, with individualized exercise RX and with personalized nutrition plan. Value goals: LDL < 66m, HDL > 40 mg.       Core Components/Risk Factors/Patient Goals Review:  Goals and Risk Factor Review    Row Name 10/17/16 0269410/26/18 0801           Core Components/Risk Factors/Patient Goals Review   Personal Goals Review  Weight Management/Obesity;Hypertension;Lipids  Weight Management/Obesity;Hypertension;Lipids      Review  Hypertension lipids and weight are stable.  Ed's weight is up only a few pounds and now he is trying to hold steady.  His blood pressure has been good.  Ed does not check his pressure as much since he is here  three times a week.  He had some medication changes and all seems to be well now.  He got his heart monitor off yesterday and was able to d/c his ellipuis.      Expected Outcomes  Cont heart healthy living   Short: Finish his 28 visits.  Long: Continue to work on risk factor modifications.          Core Components/Risk Factors/Patient Goals at Discharge (Final Review):  Goals and Risk Factor Review - 11/02/16 0801      Core Components/Risk Factors/Patient Goals Review   Personal Goals Review  Weight Management/Obesity;Hypertension;Lipids    Review  Ed's weight is up only a few pounds and now he is trying to hold steady.  His blood pressure has been good.  Ed does not check his pressure as much since he is here three times a week.  He had some medication changes and all seems to be well now.  He got his heart monitor off yesterday and was able to d/c his ellipuis.    Expected Outcomes  Short: Finish his 28 visits.  Long: Continue to work on risk factor modifications.        ITP Comments: ITP Comments    Row Name 09/27/16 1350 10/05/16 0944 10/17/16 0547 10/19/16 0641 10/26/16 0731   ITP Comments  Med Review completed. Initial ITP created. [facility]  Ed reports that his MD started him on a heart monitor for history of afib. Ed reports that he has not felt any of his afib lately. Cardiac Rehab monitor today showed Sinus Rhythm and Sinus Tachycardia only. Ed reported his furosemide, Amiodarone, Aspirin but to cont on Brilanta an Eliquis. 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 It is still sore rarely at times but nothing like it was.  30 Day Review continue with ITP unless directed changes per Medical Director Review.   Ed called to say he is sorry that he can not attend Cardiac Rehab today but [place] [place] and he does not have electric power currently.   Ed came to the  Cardiac Rehab gym today to ask [place] the blood in his urine that started last night. Ed is on 2 blood thinners which he did not take this am. 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 He had some afib after his surgery so that is why he is on blood thinner  took. I asked him to call his doctors office and speak to the nurse about the blood in his urine.    [place] Name 11/14/16 0617 11/19/16 1258         ITP Comments  30 day review. Continue with ITP unless directed changes per Medical Director review.   Called to check on status of return.   He has been out sick, it may have the flu as symptoms were similar.  He hopes to return on Wednesday.          Comments: Discharge ITP

## 2016-11-19 NOTE — Progress Notes (Deleted)
Discharge Progress Report  Patient Details  Name: Shawn Elliott MRN: [id number] Date of Birth: 08/08/1955 Referring Provider:     Cardiac Rehab from 09/27/2016 in [facility] Cardiac and Pulmonary Rehab  Referring Provider  Henke       Number of Visits: 34/36  Reason for Discharge:  Patient reached a stable level of exercise. Patient independent in their exercise. Patient has met program and personal goals.  Smoking History:  Social History   Tobacco Use  Smoking Status Former Smoker  . Types: Pipe  . Last attempt to quit: 1982  . Years since quitting: 36.8  Smokeless Tobacco Never Used    Diagnosis:  S/P CABG x 4  ADL UCSD:   Initial Exercise Prescription: Initial Exercise Prescription - 09/27/16 1400      Date of Initial Exercise RX and Referring Provider   Date  09/27/16    Referring Provider  Henke      Treadmill   MPH  2.4    Grade  1.5    Minutes  15    METs  3.33      Recumbant Bike   Level  5    RPM  60    Watts  45    Minutes  15    METs  3.35      REL-XR   Level  3    Speed  50    Minutes  15    METs  3.3      T5 Nustep   Level  3    SPM  80    Minutes  15    METs  3.3      Prescription Details   Frequency (times per week)  3    Duration  Progress to 45 minutes of aerobic exercise without signs/symptoms of physical distress      Intensity   THRR 40-80% of Max Heartrate  97-139    Ratings of Perceived Exertion  11-13    Perceived Dyspnea  0-4      Resistance Training   Training Prescription  Yes    Weight  3    Reps  10-15       Discharge Exercise Prescription (Final Exercise Prescription Changes): Exercise Prescription Changes - 11/19/16 1200      Response to Exercise   Blood Pressure (Admit)  122/64    Blood Pressure (Exercise)  134/70    Blood Pressure (Exit)  126/64    Heart Rate (Admit)  58 bpm    Heart Rate (Exercise)  92 bpm    Heart Rate (Exit)  76 bpm    Rating of Perceived Exertion (Exercise)  14    Symptoms   none    Duration  Continue with 45 min of aerobic exercise without signs/symptoms of physical distress.    Intensity  THRR unchanged      Progression   Progression  Continue to progress workloads to maintain intensity without signs/symptoms of physical distress.    Average METs  4.44      Resistance Training   Training Prescription  Yes    Weight  4 lbs    Reps  10-15      Interval Training   Interval Training  Yes    Equipment  NuStep;Recumbant Bike    Comments  33mn off 30 sec on      Treadmill   MPH  3    Grade  3    Minutes  15    METs  4.52      Recumbant Bike   Level  6    Watts  52    Minutes  15    METs  3.56      T5 Nustep   Level  3    Minutes  15    METs  5.2      Home Exercise Plan   Plans to continue exercise at  Home (comment) walking   walking   Frequency  Add 3 additional days to program exercise sessions. already walks 30 min per day most days   already walks 30 min per day most days   Initial Home Exercises Provided  10/05/16       Functional Capacity: 6 Minute Walk    Row Name 09/27/16 1422         6 Minute Walk   Distance  1300 feet     Walk Time  6 minutes     # of Rest Breaks  0     MPH  2.46     METS  3.42     RPE  9     Perceived Dyspnea   1     VO2 Peak  11.99     Symptoms  No     Resting HR  56 bpm     Resting BP  126/78     Resting Oxygen Saturation   97 %     Exercise Oxygen Saturation  during 6 min walk  97 %     Max Ex. HR  97 bpm     Max Ex. BP  138/76     2 Minute Post BP  134/70        Psychological, QOL, Others - Outcomes: PHQ 2/9: Depression screen [facility] 2/9 09/27/2016 02/06/2016  Decreased Interest 0 0  Down, Depressed, Hopeless 0 0  PHQ - 2 Score 0 0  Altered sleeping 1 0  Tired, decreased energy 1 0  Change in appetite 0 0  Feeling bad or failure about yourself  0 0  Trouble concentrating 0 0  Moving slowly or fidgety/restless 0 0  Suicidal thoughts 0 0  PHQ-9 Score 2 0  Difficult doing work/chores  Not difficult at all Not difficult at all    Quality of Life: Quality of Life - 09/27/16 1419      Quality of Life Scores   Health/Function Pre  20.86 %    Socioeconomic Pre  20.63 %    Psych/Spiritual Pre  20.71 %    Family Pre  21 %    GLOBAL Pre  20.79 %       Personal Goals: Goals established at orientation with interventions provided to work toward goal. Personal Goals and Risk Factors at Admission - 09/27/16 1403      Core Components/Risk Factors/Patient Goals on Admission   Heart Failure  Yes    Intervention  Provide a combined exercise and nutrition program that is supplemented with education, support and counseling about heart failure. Directed toward relieving symptoms such as shortness of breath, decreased exercise tolerance, and extremity edema.    Expected Outcomes  Improve functional capacity of life;Short term: Attendance in program 2-3 days a week with increased exercise capacity. Reported lower sodium intake. Reported increased fruit and vegetable intake. Reports medication compliance.;Short term: Daily weights obtained and reported for increase. Utilizing diuretic protocols set by physician.;Long term: Adoption of self-care skills and reduction of barriers for early signs and symptoms recognition and intervention leading to self-care  maintenance.    Hypertension  Yes    Intervention  Provide education on lifestyle modifcations including regular physical activity/exercise, weight management, moderate sodium restriction and increased consumption of fresh fruit, vegetables, and low fat dairy, alcohol moderation, and smoking cessation.;Monitor prescription use compliance.    Expected Outcomes  Short Term: Continued assessment and intervention until BP is < 140/57m HG in hypertensive participants. < 130/869mHG in hypertensive participants with diabetes, heart failure or chronic kidney disease.;Long Term: Maintenance of blood pressure at goal levels.    Lipids  Yes     Intervention  Provide education and support for participant on nutrition & aerobic/resistive exercise along with prescribed medications to achieve LDL <7053mHDL >33m36m  Expected Outcomes  Short Term: Participant states understanding of desired cholesterol values and is compliant with medications prescribed. Participant is following exercise prescription and nutrition guidelines.;Long Term: Cholesterol controlled with medications as prescribed, with individualized exercise RX and with personalized nutrition plan. Value goals: LDL < 70mg66mL > 40 mg.        Personal Goals Discharge: Goals and Risk Factor Review    Row Name 10/17/16 0613 06306/18 0801           Core Components/Risk Factors/Patient Goals Review   Personal Goals Review  Weight Management/Obesity;Hypertension;Lipids  Weight Management/Obesity;Hypertension;Lipids      Review  Hypertension lipids and weight are stable.  Ed's weight is up only a few pounds and now he is trying to hold steady.  His blood pressure has been good.  Ed does not check his pressure as much since he is here three times a week.  He had some medication changes and all seems to be well now.  He got his heart monitor off yesterday and was able to d/c his ellipuis.      Expected Outcomes  Cont heart healthy living   Short: Finish his 28 visits.  Long: Continue to work on risk factor modifications.          Exercise Goals and Review: Exercise Goals    Row Name 09/27/16 1422             Exercise Goals   Increase Physical Activity  Yes       Intervention  Provide advice, education, support and counseling about physical activity/exercise needs.;Develop an individualized exercise prescription for aerobic and resistive training based on initial evaluation findings, risk stratification, comorbidities and participant's personal goals.       Expected Outcomes  Achievement of increased cardiorespiratory fitness and enhanced flexibility, muscular endurance and  strength shown through measurements of functional capacity and personal statement of participant.       Increase Strength and Stamina  Yes       Intervention  Provide advice, education, support and counseling about physical activity/exercise needs.;Develop an individualized exercise prescription for aerobic and resistive training based on initial evaluation findings, risk stratification, comorbidities and participant's personal goals.       Expected Outcomes  Achievement of increased cardiorespiratory fitness and enhanced flexibility, muscular endurance and strength shown through measurements of functional capacity and personal statement of participant.       Able to understand and use rate of perceived exertion (RPE) scale  Yes       Intervention  Provide education and explanation on how to use RPE scale       Expected Outcomes  Short Term: Able to use RPE daily in rehab to express subjective intensity level;Long Term:  Able to use RPE to  guide intensity level when exercising independently       Able to understand and use Dyspnea scale  Yes       Intervention  Provide education and explanation on how to use Dyspnea scale       Expected Outcomes  Short Term: Able to use Dyspnea scale daily in rehab to express subjective sense of shortness of breath during exertion;Long Term: Able to use Dyspnea scale to guide intensity level when exercising independently       Knowledge and understanding of Target Heart Rate Range (THRR)  Yes       Intervention  Provide education and explanation of THRR including how the numbers were predicted and where they are located for reference       Expected Outcomes  Short Term: Able to state/look up THRR;Long Term: Able to use THRR to govern intensity when exercising independently;Short Term: Able to use daily as guideline for intensity in rehab       Able to check pulse independently  Yes       Intervention  Provide education and demonstration on how to check pulse in carotid  and radial arteries.;Review the importance of being able to check your own pulse for safety during independent exercise       Expected Outcomes  Short Term: Able to explain why pulse checking is important during independent exercise;Long Term: Able to check pulse independently and accurately       Understanding of Exercise Prescription  Yes       Intervention  Provide education, explanation, and written materials on patient's individual exercise prescription       Expected Outcomes  Short Term: Able to explain program exercise prescription;Long Term: Able to explain home exercise prescription to exercise independently          Nutrition & Weight - Outcomes: Pre Biometrics - 09/27/16 1413      Pre Biometrics   Height  _0  (1.854 m)    Weight  222 lb 14.4 oz (101.1 kg)    Waist Circumference  42 inches    Hip Circumference  42.5 inches    Waist to Hip Ratio  0.99 %    BMI (Calculated)  29.41    Single Leg Stand  16.78 seconds        Nutrition: Nutrition Therapy & Goals - 11/02/16 0807      Nutrition Therapy   RD appointment defered  Yes       Nutrition Discharge: Nutrition Assessments - 09/27/16 1413      MEDFICTS Scores   Pre Score  24       Education Questionnaire Score: Knowledge Questionnaire Score - 09/27/16 1421      Knowledge Questionnaire Score   Pre Score  27/28 Correct answers reviewed with Ed   Correct answers reviewed with Ed      Goals reviewed with patient; copy given to patient.

## 2016-11-19 NOTE — Telephone Encounter (Signed)
Called to check on status of return.   He has been out sick, it may have the flu as symptoms were similar.  He hopes to return on Wednesday.

## 2016-11-21 DIAGNOSIS — Z951 Presence of aortocoronary bypass graft: Secondary | ICD-10-CM

## 2016-11-21 DIAGNOSIS — Z48812 Encounter for surgical aftercare following surgery on the circulatory system: Secondary | ICD-10-CM | POA: Diagnosis not present

## 2016-11-21 NOTE — Progress Notes (Signed)
Daily Session Note  Patient Details  Name: Shawn Elliott MRN: [id number] Date of Birth: 23-Jun-1955 Referring Provider:     Cardiac Rehab from 09/27/2016 in [facility] Cardiac and Pulmonary Rehab  Referring Provider  Henke      Encounter Date: 11/21/2016  Check In: Session Check In - 11/21/16 0827      Check-In   Location  [facility]-Cardiac & Pulmonary Rehab    Staff Present  Darel Hong, RN BSN;Yuvonne Lanahan Darrin Nipper, [place], ACSM RCEP, Exercise Physiologist    Supervising physician immediately available to respond to emergencies  See telemetry face sheet for immediately available ER MD    Medication changes reported      No    Fall or balance concerns reported     No    Warm-up and Cool-down  Performed on first and last piece of equipment    Resistance Training Performed  Yes    VAD Patient?  No      Pain Assessment   Currently in Pain?  No/denies          Social History   Tobacco Use  Smoking Status Former Smoker  . Types: Pipe  . Last attempt to quit: 1982  . Years since quitting: 36.8  Smokeless Tobacco Never Used    Goals Met:  Independence with exercise equipment Exercise tolerated well No report of cardiac concerns or symptoms Strength training completed today  Goals Unmet:  Not Applicable  Comments: Pt able to follow exercise prescription today without complaint.  Will continue to monitor for progression.   Dr. Emily Filbert is Medical Director for [facility].

## 2016-11-26 ENCOUNTER — Encounter: Payer: Managed Care, Other (non HMO) | Admitting: *Deleted

## 2016-11-26 DIAGNOSIS — Z951 Presence of aortocoronary bypass graft: Secondary | ICD-10-CM

## 2016-11-26 DIAGNOSIS — Z48812 Encounter for surgical aftercare following surgery on the circulatory system: Secondary | ICD-10-CM | POA: Diagnosis not present

## 2016-11-26 NOTE — Progress Notes (Signed)
Daily Session Note  Patient Details  Name: Shawn Elliott MRN: [id number] Date of Birth: [birth year] Referring Provider:     Cardiac Rehab from 09/27/2016 in [facility] Cardiac and Pulmonary Rehab  Referring Provider  Henke      Encounter Date: 11/26/2016  Check In: Session Check In - 11/26/16 0815      Check-In   Location  [facility]-Cardiac & Pulmonary Rehab    Staff Present  Earlean Shawl, BS, ACSM CEP, Exercise Physiologist;Carroll Enterkin, RN, Levie Heritage, MA, ACSM RCEP, Exercise Physiologist    Supervising physician immediately available to respond to emergencies  See telemetry face sheet for immediately available ER MD    Medication changes reported      No    Fall or balance concerns reported     No    Warm-up and Cool-down  Performed on first and last piece of equipment    Resistance Training Performed  Yes    VAD Patient?  No      Pain Assessment   Currently in Pain?  No/denies    Multiple Pain Sites  No          Social History   Tobacco Use  Smoking Status Former Smoker  . Types: Pipe  . Last attempt to quit: 1982  . Years since quitting: 36.9  Smokeless Tobacco Never Used    Goals Met:  Independence with exercise equipment Exercise tolerated well No report of cardiac concerns or symptoms Strength training completed today  Goals Unmet:  Not Applicable  Comments: Pt able to follow exercise prescription today without complaint.  Will continue to monitor for progression. Brilliant Name 09/27/16 1422 11/26/16 0815       6 Minute Walk   Phase  -  Discharge    Distance  1300 feet  1712 feet    Distance % Change  -  31.7 %    Distance Feet Change  -  412 ft    Walk Time  6 minutes  6 minutes    # of Rest Breaks  0  0    MPH  2.46  3.24    METS  3.42  4.26    RPE  9  13    Perceived Dyspnea   1  -    VO2 Peak  11.99  14.9    Symptoms  No  Yes (comment)    Comments  -  shins burning at end of test    Resting HR  56 bpm  58 bpm    Resting  BP  126/78  118/72    Resting Oxygen Saturation   97 %  -    Exercise Oxygen Saturation  during 6 min walk  97 %  99 %    Max Ex. HR  97 bpm  107 bpm    Max Ex. BP  138/76  142/72    2 Minute Post BP  134/70  -         Dr. Emily Filbert is Medical Director for [facility].

## 2016-11-28 DIAGNOSIS — Z48812 Encounter for surgical aftercare following surgery on the circulatory system: Secondary | ICD-10-CM | POA: Diagnosis not present

## 2016-11-28 DIAGNOSIS — Z951 Presence of aortocoronary bypass graft: Secondary | ICD-10-CM

## 2016-11-28 NOTE — Progress Notes (Signed)
Daily Session Note  Patient Details  Name: Shawn Elliott MRN: [id number] Date of Birth: 09/26/1955 Referring Provider:     Cardiac Rehab from 09/27/2016 in [facility] Cardiac and Pulmonary Rehab  Referring Provider  Henke      Encounter Date: 11/28/2016  Check In: Session Check In - 11/28/16 0726      Check-In   Location  [facility]-Cardiac & Pulmonary Rehab    Staff Present  Justin Mend Lorre Nick, [place], ACSM RCEP, Exercise Physiologist;Susanne Bice, RN, BSN, [facility]    Supervising physician immediately available to respond to emergencies  See telemetry face sheet for immediately available ER MD    Medication changes reported      No    Fall or balance concerns reported     No    Warm-up and Cool-down  Performed on first and last piece of equipment    Resistance Training Performed  Yes    VAD Patient?  No      Pain Assessment   Currently in Pain?  No/denies          Social History   Tobacco Use  Smoking Status Former Smoker  . Types: Pipe  . Last attempt to quit: 1982  . Years since quitting: 36.9  Smokeless Tobacco Never Used    Goals Met:  Independence with exercise equipment Exercise tolerated well No report of cardiac concerns or symptoms Strength training completed today  Goals Unmet:  Not Applicable  Comments: Pt able to follow exercise prescription today without complaint.  Will continue to monitor for progression.   Dr. Emily Filbert is Medical Director for [facility].

## 2016-12-07 ENCOUNTER — Telehealth: Payer: Self-pay | Admitting: *Deleted

## 2016-12-07 ENCOUNTER — Encounter: Payer: Self-pay | Admitting: *Deleted

## 2016-12-07 DIAGNOSIS — Z951 Presence of aortocoronary bypass graft: Secondary | ICD-10-CM

## 2016-12-07 NOTE — Patient Instructions (Signed)
Discharge Instructions  Patient Details  Name: Shawn Elliott MRN: [id number] Date of Birth: 06/18/1955 Referring Provider:  No ref. provider found   Number of Visits: 20/25  Reason for Discharge:  Patient reached a stable level of exercise. Patient independent in their exercise. Patient has met program and personal goals.  Smoking History:  Social History   Tobacco Use  Smoking Status Former Smoker  . Types: Pipe  . Last attempt to quit: 1982  . Years since quitting: 36.9  Smokeless Tobacco Never Used    Diagnosis:  S/P CABG x 4  Initial Exercise Prescription: Initial Exercise Prescription - 09/27/16 1400      Date of Initial Exercise RX and Referring Provider   Date  09/27/16    Referring Provider  Henke      Treadmill   MPH  2.4    Grade  1.5    Minutes  15    METs  3.33      Recumbant Bike   Level  5    RPM  60    Watts  45    Minutes  15    METs  3.35      REL-XR   Level  3    Speed  50    Minutes  15    METs  3.3      T5 Nustep   Level  3    SPM  80    Minutes  15    METs  3.3      Prescription Details   Frequency (times per week)  3    Duration  Progress to 45 minutes of aerobic exercise without signs/symptoms of physical distress      Intensity   THRR 40-80% of Max Heartrate  97-139    Ratings of Perceived Exertion  11-13    Perceived Dyspnea  0-4      Resistance Training   Training Prescription  Yes    Weight  3    Reps  10-15       Discharge Exercise Prescription (Final Exercise Prescription Changes): Exercise Prescription Changes - 12/05/16 1500      Response to Exercise   Blood Pressure (Admit)  100/62    Blood Pressure (Exercise)  136/70    Blood Pressure (Exit)  122/60    Heart Rate (Admit)  72 bpm    Heart Rate (Exercise)  89 bpm    Heart Rate (Exit)  61 bpm    Rating of Perceived Exertion (Exercise)  13    Symptoms  none    Duration  Continue with 45 min of aerobic exercise without signs/symptoms of physical  distress.    Intensity  THRR unchanged      Progression   Progression  Continue to progress workloads to maintain intensity without signs/symptoms of physical distress.    Average METs  4.18      Resistance Training   Training Prescription  Yes    Weight  6 lbs    Reps  10-15      Interval Training   Interval Training  Yes    Equipment  NuStep;Recumbant Bike    Comments  81mn off 30 sec on      Treadmill   MPH  3    Grade  3    Minutes  15    METs  4.54      Recumbant Bike   Level  6    Watts  46    Minutes  15    METs  5.4      T5 Nustep   Level  3    Minutes  15    METs  2.6      Home Exercise Plan   Plans to continue exercise at  Home (comment) walking    Frequency  Add 3 additional days to program exercise sessions. already walks 30 min per day most days    Initial Home Exercises Provided  10/05/16       Functional Capacity: 6 Minute Walk    Row Name 09/27/16 1422 11/26/16 0815       6 Minute Walk   Phase  -  Discharge    Distance  1300 feet  1712 feet    Distance % Change  -  31.7 %    Distance Feet Change  -  412 ft    Walk Time  6 minutes  6 minutes    # of Rest Breaks  0  0    MPH  2.46  3.24    METS  3.42  4.26    RPE  9  13    Perceived Dyspnea   1  -    VO2 Peak  11.99  14.9    Symptoms  No  Yes (comment)    Comments  -  shins burning at end of test    Resting HR  56 bpm  58 bpm    Resting BP  126/78  118/72    Resting Oxygen Saturation   97 %  -    Exercise Oxygen Saturation  during 6 min walk  97 %  99 %    Max Ex. HR  97 bpm  107 bpm    Max Ex. BP  138/76  142/72    2 Minute Post BP  134/70  -       Quality of Life: Quality of Life - 09/27/16 1419      Quality of Life Scores   Health/Function Pre  20.86 %    Socioeconomic Pre  20.63 %    Psych/Spiritual Pre  20.71 %    Family Pre  21 %    GLOBAL Pre  20.79 %       Personal Goals: Goals established at orientation with interventions provided to work toward goal. Personal  Goals and Risk Factors at Admission - 09/27/16 1403      Core Components/Risk Factors/Patient Goals on Admission   Heart Failure  Yes    Intervention  Provide a combined exercise and nutrition program that is supplemented with education, support and counseling about heart failure. Directed toward relieving symptoms such as shortness of breath, decreased exercise tolerance, and extremity edema.    Expected Outcomes  Improve functional capacity of life;Short term: Attendance in program 2-3 days a week with increased exercise capacity. Reported lower sodium intake. Reported increased fruit and vegetable intake. Reports medication compliance.;Short term: Daily weights obtained and reported for increase. Utilizing diuretic protocols set by physician.;Long term: Adoption of self-care skills and reduction of barriers for early signs and symptoms recognition and intervention leading to self-care maintenance.    Hypertension  Yes    Intervention  Provide education on lifestyle modifcations including regular physical activity/exercise, weight management, moderate sodium restriction and increased consumption of fresh fruit, vegetables, and low fat dairy, alcohol moderation, and smoking cessation.;Monitor prescription use compliance.    Expected Outcomes  Short Term: Continued assessment and intervention until BP is < 140/39m HG in hypertensive  participants. < 130/94m HG in hypertensive participants with diabetes, heart failure or chronic kidney disease.;Long Term: Maintenance of blood pressure at goal levels.    Lipids  Yes    Intervention  Provide education and support for participant on nutrition & aerobic/resistive exercise along with prescribed medications to achieve LDL <771m HDL >4036m   Expected Outcomes  Short Term: Participant states understanding of desired cholesterol values and is compliant with medications prescribed. Participant is following exercise prescription and nutrition guidelines.;Long  Term: Cholesterol controlled with medications as prescribed, with individualized exercise RX and with personalized nutrition plan. Value goals: LDL < 82m58mDL > 40 mg.        Personal Goals Discharge: Goals and Risk Factor Review - 11/02/16 0801      Core Components/Risk Factors/Patient Goals Review   Personal Goals Review  Weight Management/Obesity;Hypertension;Lipids    Review  Ed's weight is up only a few pounds and now he is trying to hold steady.  His blood pressure has been good.  Ed does not check his pressure as much since he is here three times a week.  He had some medication changes and all seems to be well now.  He got his heart monitor off yesterday and was able to d/c his ellipuis.    Expected Outcomes  Short: Finish his 28 visits.  Long: Continue to work on risk factor modifications.        Exercise Goals and Review: Exercise Goals    Row Name 09/27/16 1422             Exercise Goals   Increase Physical Activity  Yes       Intervention  Provide advice, education, support and counseling about physical activity/exercise needs.;Develop an individualized exercise prescription for aerobic and resistive training based on initial evaluation findings, risk stratification, comorbidities and participant's personal goals.       Expected Outcomes  Achievement of increased cardiorespiratory fitness and enhanced flexibility, muscular endurance and strength shown through measurements of functional capacity and personal statement of participant.       Increase Strength and Stamina  Yes       Intervention  Provide advice, education, support and counseling about physical activity/exercise needs.;Develop an individualized exercise prescription for aerobic and resistive training based on initial evaluation findings, risk stratification, comorbidities and participant's personal goals.       Expected Outcomes  Achievement of increased cardiorespiratory fitness and enhanced flexibility, muscular  endurance and strength shown through measurements of functional capacity and personal statement of participant.       Able to understand and use rate of perceived exertion (RPE) scale  Yes       Intervention  Provide education and explanation on how to use RPE scale       Expected Outcomes  Short Term: Able to use RPE daily in rehab to express subjective intensity level;Long Term:  Able to use RPE to guide intensity level when exercising independently       Able to understand and use Dyspnea scale  Yes       Intervention  Provide education and explanation on how to use Dyspnea scale       Expected Outcomes  Short Term: Able to use Dyspnea scale daily in rehab to express subjective sense of shortness of breath during exertion;Long Term: Able to use Dyspnea scale to guide intensity level when exercising independently       Knowledge and understanding of Target Heart Rate Range (THRR)  Yes  Intervention  Provide education and explanation of THRR including how the numbers were predicted and where they are located for reference       Expected Outcomes  Short Term: Able to state/look up THRR;Long Term: Able to use THRR to govern intensity when exercising independently;Short Term: Able to use daily as guideline for intensity in rehab       Able to check pulse independently  Yes       Intervention  Provide education and demonstration on how to check pulse in carotid and radial arteries.;Review the importance of being able to check your own pulse for safety during independent exercise       Expected Outcomes  Short Term: Able to explain why pulse checking is important during independent exercise;Long Term: Able to check pulse independently and accurately       Understanding of Exercise Prescription  Yes       Intervention  Provide education, explanation, and written materials on patient's individual exercise prescription       Expected Outcomes  Short Term: Able to explain program exercise  prescription;Long Term: Able to explain home exercise prescription to exercise independently          Nutrition & Weight - Outcomes: Pre Biometrics - 09/27/16 1413      Pre Biometrics   Height  _0  (1.854 m)    Weight  222 lb 14.4 oz (101.1 kg)    Waist Circumference  42 inches    Hip Circumference  42.5 inches    Waist to Hip Ratio  0.99 %    BMI (Calculated)  29.41    Single Leg Stand  16.78 seconds        Nutrition: Nutrition Therapy & Goals - 11/02/16 0807      Nutrition Therapy   RD appointment defered  Yes       Nutrition Discharge: Nutrition Assessments - 09/27/16 1413      MEDFICTS Scores   Pre Score  24       Education Questionnaire Score: Knowledge Questionnaire Score - 09/27/16 1421      Knowledge Questionnaire Score   Pre Score  27/28 Correct answers reviewed with Ed       Goals reviewed with patient; copy given to patient.

## 2016-12-07 NOTE — Telephone Encounter (Signed)
Returned call to check on patient. Wednesday Shawn Elliott left a message about not attending due to insurance issues and filing claims.  He still did not come to class today still.  So called to check on him.  They are still resolving insurance claims. At this point, he has done his walk test and his paperwork.  He is going to drop off his paperwork on Monday and will graduate early.

## 2016-12-10 ENCOUNTER — Encounter: Payer: Self-pay | Admitting: *Deleted

## 2016-12-10 NOTE — Progress Notes (Signed)
Cardiac Individual Treatment Plan  Patient Details  Name: Shawn Elliott MRN: [id number] Date of Birth: Apr 10, 1963 Referring Provider:     Cardiac Rehab from 09/28/2023 in [facility] Cardiac and Pulmonary Rehab  Referring Provider  Henke      Initial Encounter Date:    Cardiac Rehab from 09/28/2023 in [facility] Cardiac and Pulmonary Rehab  Date  09/27/16  Referring Provider  Henke      Visit Diagnosis: No diagnosis found.  Patient's Home Medications on Admission:  Current Outpatient Medications:  .  albuterol (PROVENTIL HFA) 108 (90 Base) MCG/ACT inhaler, Inhale 2 puffs into the lungs every 4 (four) hours as needed for wheezing or shortness of breath. (Patient not taking: Reported on 09/27/2016), Disp: 1 Inhaler, Rfl: 0 .  amiodarone (PACERONE) 200 MG tablet, Take by mouth., Disp: , Rfl:  .  apixaban (ELIQUIS) 5 MG TABS tablet, Take by mouth., Disp: , Rfl:  .  aspirin EC 81 MG tablet, Take 81 mg by mouth daily., Disp: , Rfl:  .  atorvastatin (LIPITOR) 80 MG tablet, Take 1 tablet (80 mg total) by mouth daily at 6 PM. (Patient taking differently: Take 80 mg by mouth at bedtime. ), Disp: 90 tablet, Rfl: 3 .  ibuprofen (ADVIL,MOTRIN) 200 MG tablet, Take 400 mg by mouth every 6 (six) hours as needed (pain)., Disp: , Rfl:  .  isosorbide mononitrate (IMDUR) 30 MG 24 hr tablet, Take 1 tablet (30 mg total) by mouth daily., Disp: 90 tablet, Rfl: 3 .  lisinopril (PRINIVIL,ZESTRIL) 2.5 MG tablet, Take 1 tablet (2.5 mg total) by mouth daily., Disp: 90 tablet, Rfl: 3 .  metoprolol tartrate (LOPRESSOR) 25 MG tablet, Take 0.5 tablets (12.5 mg total) by mouth 2 (two) times daily., Disp: 90 tablet, Rfl: 3 .  nitroGLYCERIN (NITROSTAT) 0.4 MG SL tablet, Place 1 tablet (0.4 mg total) under the tongue every 5 (five) minutes as needed for chest pain. (Patient not taking: Reported on 09/27/2016), Disp: 25 tablet, Rfl: 2 .  omeprazole (PRILOSEC) 20 MG capsule, Take 20 mg by mouth daily., Disp: , Rfl:  .  predniSONE  (DELTASONE) 20 MG tablet, Take 2 tablets (40 mg total) by mouth daily. (Patient not taking: Reported on 09/27/2016), Disp: 8 tablet, Rfl: 0 .  ticagrelor (BRILINTA) 90 MG TABS tablet, Take 1 tablet (90 mg total) by mouth 2 (two) times daily., Disp: 180 tablet, Rfl: 3  Past Medical History: Past Medical History:  Diagnosis Date  . Coronary artery disease   . GERD (gastroesophageal reflux disease)   . High cholesterol   . History of kidney stones    "passed them" (01/17/2016)  . Hypertension   . PONV (postoperative nausea and vomiting)    "when I woke up from my hernia I was nauseated"  . STEMI (ST elevation myocardial infarction) ([place]) 01/12/2016    Tobacco Use: Social History   Tobacco Use  Smoking Status Former Smoker  . Types: Pipe  . Last attempt to quit: 1982  . Years since quitting: 36.9  Smokeless Tobacco Never Used    Labs: Recent Chemical engineer    Labs for ITP Cardiac and Pulmonary Rehab Latest Ref Rng & Units 01/12/2016 03/17/2016 04/20/2016   Cholestrol 100 - 199 mg/dL 266(H) 97 111   LDLCALC 0 - 99 mg/dL 182(H) 50 56   HDL >39 mg/dL 47 25(L) 34(L)   Trlycerides 0 - 149 mg/dL 183(H) 112 107   Hemoglobin A1c 4.8 - 5.6 % 5.4 - -   TCO2 0 -  100 mmol/L 21 - -       Exercise Target Goals:    Exercise Program Goal: Individual exercise prescription set with THRR, safety & activity barriers. Participant demonstrates ability to understand and report RPE using BORG scale, to self-measure pulse accurately, and to acknowledge the importance of the exercise prescription.  Exercise Prescription Goal: Starting with aerobic activity 30 plus minutes a day, 3 days per week for initial exercise prescription. Provide home exercise prescription and guidelines that participant acknowledges understanding prior to discharge.  Activity Barriers & Risk Stratification: Activity Barriers & Cardiac Risk Stratification - 09/27/16 1426      Activity Barriers & Cardiac Risk  Stratification   Activity Barriers  Shortness of Breath Still gets short of breath when bending over or with certain activities    Cardiac Risk Stratification  High       6 Minute Walk: 6 Minute Walk    Row Name 09/27/16 1422 11/26/16 0815       6 Minute Walk   Phase  -  Discharge    Distance  1300 feet  1712 feet    Distance % Change  -  31.7 %    Distance Feet Change  -  412 ft    Walk Time  6 minutes  6 minutes    # of Rest Breaks  0  0    MPH  2.46  3.24    METS  3.42  4.26    RPE  9  13    Perceived Dyspnea   1  -    VO2 Peak  11.99  14.9    Symptoms  No  Yes (comment)    Comments  -  shins burning at end of test    Resting HR  56 bpm  58 bpm    Resting BP  126/78  118/72    Resting Oxygen Saturation   97 %  -    Exercise Oxygen Saturation  during 6 min walk  97 %  99 %    Max Ex. HR  97 bpm  107 bpm    Max Ex. BP  138/76  142/72    2 Minute Post BP  134/70  -       Oxygen Initial Assessment: Oxygen Initial Assessment - 10/17/16 0612      Home Oxygen   Home Oxygen Device  None      Initial 6 min Walk   Oxygen Used  None      Program Oxygen Prescription   Program Oxygen Prescription  None       Oxygen Re-Evaluation: Oxygen Re-Evaluation    Row Name 10/17/16 0612             Home Oxygen   Sleep Oxygen Prescription  None       Home Exercise Oxygen Prescription  None       Home at Rest Exercise Oxygen Prescription  None          Oxygen Discharge (Final Oxygen Re-Evaluation): Oxygen Re-Evaluation - 10/17/16 0612      Home Oxygen   Sleep Oxygen Prescription  None    Home Exercise Oxygen Prescription  None    Home at Rest Exercise Oxygen Prescription  None       Initial Exercise Prescription: Initial Exercise Prescription - 09/27/16 1400      Date of Initial Exercise RX and Referring Provider   Date  09/27/16    Referring Provider  Saunders Glance  Treadmill   MPH  2.4    Grade  1.5    Minutes  15    METs  3.33      Recumbant Bike    Level  5    RPM  60    Watts  45    Minutes  15    METs  3.35      REL-XR   Level  3    Speed  50    Minutes  15    METs  3.3      T5 Nustep   Level  3    SPM  80    Minutes  15    METs  3.3      Prescription Details   Frequency (times per week)  3    Duration  Progress to 45 minutes of aerobic exercise without signs/symptoms of physical distress      Intensity   THRR 40-80% of Max Heartrate  97-139    Ratings of Perceived Exertion  11-13    Perceived Dyspnea  0-4      Resistance Training   Training Prescription  Yes    Weight  3    Reps  10-15       Perform Capillary Blood Glucose checks as needed.  Exercise Prescription Changes: Exercise Prescription Changes    Row Name 09/27/16 1400 10/05/16 0900 10/10/16 1500 10/24/16 1400 11/07/16 1400     Response to Exercise   Blood Pressure (Admit)  126/78  -  126/64  108/66  108/62   Blood Pressure (Exercise)  138/76  -  126/66  138/76  128/76   Blood Pressure (Exit)  134/70  -  110/68  104/70  126/66   Heart Rate (Admit)  66 bpm  -  57 bpm  60 bpm  62 bpm   Heart Rate (Exercise)  97 bpm  -  91 bpm  78 bpm  100 bpm   Heart Rate (Exit)  63 bpm  -  81 bpm  72 bpm  80 bpm   Oxygen Saturation (Admit)  97 %  -  -  -  -   Oxygen Saturation (Exit)  97 %  -  -  -  -   Rating of Perceived Exertion (Exercise)  9  -  _0 Symptoms  -  -  none  none  none   Duration  -  -  Continue with 45 min of aerobic exercise without signs/symptoms of physical distress.  Continue with 45 min of aerobic exercise without signs/symptoms of physical distress.  Continue with 45 min of aerobic exercise without signs/symptoms of physical distress.   Intensity  -  -  THRR unchanged  THRR unchanged  THRR unchanged     Progression   Progression  -  -  Continue to progress workloads to maintain intensity without signs/symptoms of physical distress.  Continue to progress workloads to maintain intensity without signs/symptoms of physical distress.   Continue to progress workloads to maintain intensity without signs/symptoms of physical distress.   Average METs  -  -  3.08  2.35  3.59     Resistance Training   Training Prescription  -  -  Yes  Yes  Yes   Weight  -  -  _1 lbs   Reps  -  -  10-15  10-15  10-15     Interval Training   Interval Training  -  -  No  No  No     Treadmill   MPH  -  -  2.4  -  3   Grade  -  -  1.5  -  3   Minutes  -  -  15  -  15   METs  -  -  3.33  -  4.52     Recumbant Bike   Level  -  -  6  -  6   Watts  -  -  46  -  46   Minutes  -  -  15  -  15   METs  -  -  3.42  -  3.42     NuStep   METs  -  -  -  2.5  -     T5 Nustep   Level  -  -  _0 SPM  -  -  -  80  -   Minutes  -  -  _1 METs  -  -  2.5  2.2  2.8     Home Exercise Plan   Plans to continue exercise at  -  Home (comment) walking  Home (comment) walking  Home (comment) walking  Home (comment) walking   Frequency  -  Add 3 additional days to program exercise sessions. already walks 30 min per day most days  Add 3 additional days to program exercise sessions. already walks 30 min per day most days  Add 3 additional days to program exercise sessions. already walks 30 min per day most days  Add 3 additional days to program exercise sessions. already walks 30 min per day most days   Initial Home Exercises Provided  -  10/05/16  10/05/16  10/05/16  10/05/16   Row Name 11/19/16 1200 12/05/16 1500           Response to Exercise   Blood Pressure (Admit)  122/64  100/62      Blood Pressure (Exercise)  134/70  136/70      Blood Pressure (Exit)  126/64  122/60      Heart Rate (Admit)  58 bpm  72 bpm      Heart Rate (Exercise)  92 bpm  89 bpm      Heart Rate (Exit)  76 bpm  61 bpm      Rating of Perceived Exertion (Exercise)  14  13      Symptoms  none  none      Duration  Continue with 45 min of aerobic exercise without signs/symptoms of physical distress.  Continue with 45 min of aerobic exercise without signs/symptoms  of physical distress.      Intensity  THRR unchanged  THRR unchanged        Progression   Progression  Continue to progress workloads to maintain intensity without signs/symptoms of physical distress.  Continue to progress workloads to maintain intensity without signs/symptoms of physical distress.      Average METs  4.44  4.18        Resistance Training   Training Prescription  Yes  Yes      Weight  4 lbs  6 lbs      Reps  10-15  10-15        Interval Training   Interval Training  Yes  Yes      Equipment  NuStep;Recumbant Bike  NuStep;Recumbant Bike  Comments  39mn off 30 sec on  226m off 30 sec on        Treadmill   MPH  3  3      Grade  3  3      Minutes  15  15      METs  4.52  4.54        Recumbant Bike   Level  6  6      Watts  52  46      Minutes  15  15      METs  3.56  5.4        T5 Nustep   Level  3  3      Minutes  15  15      METs  5.2  2.6        Home Exercise Plan   Plans to continue exercise at  Home (comment) walking  Home (comment) walking      Frequency  Add 3 additional days to program exercise sessions. already walks 30 min per day most days  Add 3 additional days to program exercise sessions. already walks 30 min per day most days      Initial Home Exercises Provided  10/05/16  10/05/16         Exercise Comments: Exercise Comments    Row Name 10/01/16 0818           Exercise Comments  First full day of exercise!  Patient was oriented to gym and equipment including functions, settings, policies, and procedures.  Patient's individual exercise prescription and treatment plan were reviewed.  All starting workloads were established based on the results of the 6 minute walk test done at initial orientation visit.  The plan for exercise progression was also introduced and progression will be customized based on patient's performance and goals.          Exercise Goals and Review: Exercise Goals    Row Name 09/27/16 1422             Exercise  Goals   Increase Physical Activity  Yes       Intervention  Provide advice, education, support and counseling about physical activity/exercise needs.;Develop an individualized exercise prescription for aerobic and resistive training based on initial evaluation findings, risk stratification, comorbidities and participant's personal goals.       Expected Outcomes  Achievement of increased cardiorespiratory fitness and enhanced flexibility, muscular endurance and strength shown through measurements of functional capacity and personal statement of participant.       Increase Strength and Stamina  Yes       Intervention  Provide advice, education, support and counseling about physical activity/exercise needs.;Develop an individualized exercise prescription for aerobic and resistive training based on initial evaluation findings, risk stratification, comorbidities and participant's personal goals.       Expected Outcomes  Achievement of increased cardiorespiratory fitness and enhanced flexibility, muscular endurance and strength shown through measurements of functional capacity and personal statement of participant.       Able to understand and use rate of perceived exertion (RPE) scale  Yes       Intervention  Provide education and explanation on how to use RPE scale       Expected Outcomes  Short Term: Able to use RPE daily in rehab to express subjective intensity level;Long Term:  Able to use RPE to guide intensity level when exercising independently       Able to understand  and use Dyspnea scale  Yes       Intervention  Provide education and explanation on how to use Dyspnea scale       Expected Outcomes  Short Term: Able to use Dyspnea scale daily in rehab to express subjective sense of shortness of breath during exertion;Long Term: Able to use Dyspnea scale to guide intensity level when exercising independently       Knowledge and understanding of Target Heart Rate Range (THRR)  Yes       Intervention   Provide education and explanation of THRR including how the numbers were predicted and where they are located for reference       Expected Outcomes  Short Term: Able to state/look up THRR;Long Term: Able to use THRR to govern intensity when exercising independently;Short Term: Able to use daily as guideline for intensity in rehab       Able to check pulse independently  Yes       Intervention  Provide education and demonstration on how to check pulse in carotid and radial arteries.;Review the importance of being able to check your own pulse for safety during independent exercise       Expected Outcomes  Short Term: Able to explain why pulse checking is important during independent exercise;Long Term: Able to check pulse independently and accurately       Understanding of Exercise Prescription  Yes       Intervention  Provide education, explanation, and written materials on patient's individual exercise prescription       Expected Outcomes  Short Term: Able to explain program exercise prescription;Long Term: Able to explain home exercise prescription to exercise independently          Exercise Goals Re-Evaluation : Exercise Goals Re-Evaluation    Row Name 10/01/16 0818 10/05/16 0913 10/05/16 0914 10/10/16 1540 10/24/16 1449     Exercise Goal Re-Evaluation   Exercise Goals Review  Able to understand and use rate of perceived exertion (RPE) scale;Knowledge and understanding of Target Heart Rate Range (THRR);Understanding of Exercise Prescription  Increase Physical Activity;Increase Strength and Stamina;Able to understand and use rate of perceived exertion (RPE) scale;Knowledge and understanding of Target Heart Rate Range (THRR);Able to check pulse independently;Understanding of Exercise Prescription  Increase Physical Activity;Increase Strength and Stamina;Able to understand and use rate of perceived exertion (RPE) scale;Knowledge and understanding of Target Heart Rate Range (THRR);Able to check pulse  independently;Understanding of Exercise Prescription  Increase Physical Activity;Increase Strength and Stamina  Increase Physical Activity;Increase Strength and Stamina   Comments  Reviewed RPE scale, THR and program prescription with pt today.  Pt voiced understanding and was given a copy of goals to take home.   -  Reviewed home exercise with pt today.  Pt plans to walk for exercise.  Reviewed THR, pulse, RPE, sign and symptoms, NTG use, and when to call 911 or MD.  Also discussed weather considerations and indoor options.  Pt voiced understanding.  Ed is off to a good start in rehab. He has been able to push himself.  He is already up to 46W on the recumbent bike.  We will continue to monitor his progression.   Ed continues to tolerate exercise well.  Staff will monitor progress.   Expected Outcomes  Short: Use RPE daily to regulate intensity.  Long: Follow program prescription in THR.  -  Short - Ed will continue walking on days he doesn't attend HT.  He will monitor RPE when walking.  Long - Ed  will maintain exercise on his own  Short: Discuss adding in interval training for exercise.  Long: Continue to exercise independently.   Short - Ed will continue to exercise regularly on his own and in class.  Long - Ed will continue to increase workloads in class.   [place] Name 11/02/16 0756 11/07/16 1446 11/19/16 1258 12/05/16 1501       Exercise Goal Re-Evaluation   Exercise Goals Review  Increase Physical Activity;Increase Strength and Stamina;Understanding of Exercise Prescription  -  Increase Physical Activity;Increase Strength and Stamina;Understanding of Exercise Prescription  Increase Physical Activity;Increase Strength and Stamina;Understanding of Exercise Prescription    Comments  Ed has been doing well in rehab.  He is exercising at home twice a day for a total of an hour walking on the road with a hill.  It has been going well.  He feels that his strength and stamina are getting better.  He has also  noticed that he is breathing better and able to do more at home.   He only has 28 sessions this go around.   -  Ed has been out sick and only attended once since last review.  We will try to get him back up to speed once he returns.  Ed returned to rehab for a few days.  He was able to return to his workloads without a problem. We will continue to monitor his progression.     Expected Outcomes  Short: Add in intervals to workouts.  Long: Continue to exercise independently on off days.   -  Short: Return to rehab regularly.  Long: Continue to exercise to increase strength and stamina  Short: Return to rehab regularly.  Long: Continue to exercise to increase strength and stamina       Discharge Exercise Prescription (Final Exercise Prescription Changes): Exercise Prescription Changes - 12/05/16 1500      Response to Exercise   Blood Pressure (Admit)  100/62    Blood Pressure (Exercise)  136/70    Blood Pressure (Exit)  122/60    Heart Rate (Admit)  72 bpm    Heart Rate (Exercise)  89 bpm    Heart Rate (Exit)  61 bpm    Rating of Perceived Exertion (Exercise)  13    Symptoms  none    Duration  Continue with 45 min of aerobic exercise without signs/symptoms of physical distress.    Intensity  THRR unchanged      Progression   Progression  Continue to progress workloads to maintain intensity without signs/symptoms of physical distress.    Average METs  4.18      Resistance Training   Training Prescription  Yes    Weight  6 lbs    Reps  10-15      Interval Training   Interval Training  Yes    Equipment  NuStep;Recumbant Bike    Comments  72mn off 30 sec on      Treadmill   MPH  3    Grade  3    Minutes  15    METs  4.54      Recumbant Bike   Level  6    Watts  46    Minutes  15    METs  5.4      T5 Nustep   Level  3    Minutes  15    METs  2.6      Home Exercise Plan   Plans to continue exercise at  Home (comment) walking    Frequency  Add 3 additional days to program  exercise sessions. already walks 30 min per day most days    Initial Home Exercises Provided  10/05/16       Nutrition:  Target Goals: Understanding of nutrition guidelines, daily intake of sodium <1535m, cholesterol <2026m calories 30% from fat and 7% or less from saturated fats, daily to have 5 or more servings of fruits and vegetables.  Biometrics: Pre Biometrics - 09/27/16 1413      Pre Biometrics   Height  _0  (1.854 m)    Weight  222 lb 14.4 oz (101.1 kg)    Waist Circumference  42 inches    Hip Circumference  42.5 inches    Waist to Hip Ratio  0.99 %    BMI (Calculated)  29.41    Single Leg Stand  16.78 seconds        Nutrition Therapy Plan and Nutrition Goals: Nutrition Therapy & Goals - 11/02/16 0807      Nutrition Therapy   RD appointment defered  Yes       Nutrition Discharge: Rate Your Plate Scores: Nutrition Assessments - 12/10/16 1258      MEDFICTS Scores   Pre Score  24    Post Score  32    Score Difference  8       Nutrition Goals Re-Evaluation: Nutrition Goals Re-Evaluation    Row Name 11/02/16 0807             Goals   Nutrition Goal  Follow heart healthy diet       Comment  Continues to decline nutrition meeting.  He uses his apple watch to track activity levels and tries to make healthy choices.   He is staying on track with his weight.        Expected Outcome  Short: Continue to work on weight control.  Long: Follow heart healthy diet          Nutrition Goals Discharge (Final Nutrition Goals Re-Evaluation): Nutrition Goals Re-Evaluation - 11/02/16 0807      Goals   Nutrition Goal  Follow heart healthy diet    Comment  Continues to decline nutrition meeting.  He uses his apple watch to track activity levels and tries to make healthy choices.   He is staying on track with his weight.     Expected Outcome  Short: Continue to work on weight control.  Long: Follow heart healthy diet       Psychosocial: Target Goals: Acknowledge  presence or absence of significant depression and/or stress, maximize coping skills, provide positive support system. Participant is able to verbalize types and ability to use techniques and skills needed for reducing stress and depression.   Initial Review & Psychosocial Screening: Initial Psych Review & Screening - 09/27/16 1417      Initial Review   Current issues with  Current Sleep Concerns Ed reports having trouble staying asleep. What he is currently doing is staying up super late until he can't hold his eyes open then he is actually being able to [place] Wife, family, friends       Screening Interventions   Interventions  Yes;Encouraged to exercise;Program counselor consult    Expected Outcomes  Short Term goal: Utilizing psychosocial counselor, staff and physician to assist with identification of specific Stressors or current issues interfering with healing process. Setting desired  goal for each stressor or current issue identified.;Long Term Goal: Stressors or current issues are controlled or eliminated.;Short Term goal: Identification and review with participant of any Quality of Life or Depression concerns found by scoring the questionnaire.;Long Term goal: The participant improves quality of Life and PHQ9 Scores as seen by post scores and/or verbalization of changes       Quality of Life Scores:  Quality of Life - 12/10/16 1259      Quality of Life Scores   Health/Function Pre  20.86 %    Health/Function Post  24.5 %    Health/Function % Change  17.45 %    Socioeconomic Pre  20.63 %    Socioeconomic Post  24.38 %    Socioeconomic % Change   18.18 %    Psych/Spiritual Pre  20.71 %    Psych/Spiritual Post  28.29 %    Psych/Spiritual % Change  36.6 %    Family Pre  21 %    Family Post  26.4 %    Family % Change  25.71 %    GLOBAL Pre  20.79 %    GLOBAL Post  25.5 %    GLOBAL % Change  22.66 %        PHQ-9: Recent Review Flowsheet Data    Depression screen [facility] 2/9 12/10/2016 09/27/2016 02/06/2016   Decreased Interest 0 0 0   Down, Depressed, Hopeless 0 0 0   PHQ - 2 Score 0 0 0   Altered sleeping 1 1 0   Tired, decreased energy 1 1 0   Change in appetite 1 0 0   Feeling bad or failure about yourself  0 0 0   Trouble concentrating 0 0 0   Moving slowly or fidgety/restless 0 0 0   Suicidal thoughts 0 0 0   PHQ-9 Score 3 2 0   Difficult doing work/chores Not difficult at all Not difficult at all Not difficult at all     Interpretation of Total Score  Total Score Depression Severity:  1-4 = Minimal depression, 5-9 = Mild depression, 10-14 = Moderate depression, 15-19 = Moderately severe depression, 20-27 = Severe depression   Psychosocial Evaluation and Intervention: Psychosocial Evaluation - 10/01/16 0908      Psychosocial Evaluation & Interventions   Comments  Mr Mannis returned today after (7) 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  Counselor met with Ed for initial psychosocial evaluation for this course of Cardiac Rehab.  He is a 61 year old who has a strong support system with a spouse; lots of family; friends and Ed is actively involved in his local church.  He sleeps well and has a good appetite; although he has lost 52 lbs. since his first heart attack in January of this year.  Ed denies a history of depresion or anxiety or any current symptoms.  He reports being in a positive mood most of the time now that he can drive and engage in more normal activities.  He has some additional stress being out of work and on [place] and his health.  Ed has goals to increase his stamina and strength while in this program to be able to return to work in November.  Staff will follow with Ed throughout the course of this program.     Expected Outcomes  Ed will benefit from consistent exercise to achieve his stated goals.  The educational and psychoeducational components will be  helpful to learn and manage  his disease better.      Continue Psychosocial Services   Follow up required by staff       Psychosocial Re-Evaluation: Psychosocial Re-Evaluation    Row Name 10/05/16 [phone] 10/05/16 0954 10/19/16 0641 10/26/16 0734 11/02/16 0808     Psychosocial Re-Evaluation   Current issues with  -  -  Current Stress Concerns  -  Current Stress Concerns   Comments  Ed says he feels so much better now that he does not have afib. He states he is not depressed and not anxious.   -  Ed called to say he is sorry that he can not attend Cardiac Rehab today but due to the [facility] he has a tree down in his front yard and he does not have electric power currently.   Ed came to the Cardiac Rehab gym today to ask [place] about the blood in his urine that started last night. Ed is on 2 blood thinners which he did not take this am. 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 He had some afib after his surgery so that is why he is on blood thinner took. I asked him to call his doctors office and speak to the nurse about the blood in his urine  Ed returns to work on Nov 5.  Mentally he is not ready, but hopes physically that he with stand 8 hours a day on his feet.  He has started to have some med changes which seem to be working. He continues to sleep well, he is no longer snoring as much.  He has a good support system with his wife.  The weight loss and activity are really helping him feel better overall.  This round, he knew what to expect and was excited about seeing the same staff and knowing what to do and what is expected.   Expected Outcomes  -  Ed does have a good support system. with his wife and family  -  -  Short: Continue to attend his 28 sessions and return to work.  Long: Cope with stress of returning to work.    Interventions  -  -  Encouraged to attend Cardiac Rehabilitation for the exercise  -  Encouraged to attend Cardiac Rehabilitation  for the exercise;Stress management education   Continue Psychosocial Services   -  No Follow up required  Follow up required by staff  -  Follow up required by staff     Initial Review   Source of Stress Concerns  -  -  -  -  Chronic Illness;Occupation      Psychosocial Discharge (Final Psychosocial Re-Evaluation): Psychosocial Re-Evaluation - 11/02/16 0808      Psychosocial Re-Evaluation   Current issues with  Current Stress Concerns    Comments  Ed returns to work on Nov 5.  Mentally he is not ready, but hopes physically that he with stand 8 hours a day on his feet.  He has started to have some med changes which seem to be working. He continues to sleep well, he is no longer snoring as much.  He has a good support system with his wife.  The weight loss and activity are really helping him feel better overall.  This round, he knew what to expect and was excited about seeing the same staff and knowing what to do and what is expected.    Expected Outcomes  Short: Continue  to attend his 28 sessions and return to work.  Long: Cope with stress of returning to work.     Interventions  Encouraged to attend Cardiac Rehabilitation for the exercise;Stress management education    Continue Psychosocial Services   Follow up required by staff      Initial Review   Source of Stress Concerns  Chronic Illness;Occupation       Vocational Rehabilitation: Provide vocational rehab assistance to qualifying candidates.   Vocational Rehab Evaluation & Intervention: Vocational Rehab - 09/27/16 1424      Initial Vocational Rehab Evaluation & Intervention   Assessment shows need for Vocational Rehabilitation  No       Education: Education Goals: Education classes will be provided on a variety of topics geared toward better understanding of heart health and risk factor modification. Participant will state understanding/return demonstration of topics presented as noted by education test scores.  Learning  Barriers/Preferences: Learning Barriers/Preferences - 09/27/16 1420      Learning Barriers/Preferences   Learning Barriers  None    Learning Preferences  None       Education Topics: General Nutrition Guidelines/Fats and Fiber: -Group instruction provided by verbal, written material, models and posters to present the general guidelines for heart healthy nutrition. Gives an explanation and review of dietary fats and fiber.   Controlling Sodium/Reading Food Labels: -Group verbal and written material supporting the discussion of sodium use in heart healthy nutrition. Review and explanation with models, verbal and written materials for utilization of the food label.   Exercise Physiology & Risk Factors: - Group verbal and written instruction with models to review the exercise physiology of the cardiovascular system and associated critical values. Details cardiovascular disease risk factors and the goals associated with each risk factor.   Cardiac Rehab from 11/26/2016 in [facility] Cardiac and Pulmonary Rehab  Date  11/05/16  Educator  [facility]  Instruction Review Code  5- Refused Teaching      Aerobic Exercise & Resistance Training: - Gives group verbal and written discussion on the health impact of inactivity. On the components of aerobic and resistive training programs and the benefits of this training and how to safely progress through these programs.   Cardiac Rehab from 11/26/2016 in [facility] Cardiac and Pulmonary Rehab  Date  11/07/16  Educator  [facility]  Instruction Review Code  1- Verbalizes Understanding      Flexibility, Balance, General Exercise Guidelines: - Provides group verbal and written instruction on the benefits of flexibility and balance training programs. Provides general exercise guidelines with specific guidelines to those with heart or lung disease. Demonstration and skill practice provided.   Stress Management: - Provides group verbal and written instruction about the health  risks of elevated stress, cause of high stress, and healthy ways to reduce stress.   Cardiac Rehab from 11/26/2016 in [facility] Cardiac and Pulmonary Rehab  Date  11/21/16  Educator  [facility]  Instruction Review Code  5- Refused Teaching      Depression: - Provides group verbal and written instruction on the correlation between heart/lung disease and depressed mood, treatment options, and the stigmas associated with seeking treatment.   Cardiac Rehab from 11/26/2016 in [facility] Cardiac and Pulmonary Rehab  Date  10/24/16  Educator  [facility]  Instruction Review Code  5- Refused Teaching      Anatomy & Physiology of the Heart: - Group verbal and written instruction and models provide basic cardiac anatomy and physiology, with the coronary electrical and arterial systems. Review of:  AMI, Angina, Valve disease, Heart Failure, Cardiac Arrhythmia, Pacemakers, and the ICD.   Cardiac Procedures: - Group verbal and written instruction to review commonly prescribed medications for heart disease. Reviews the medication, class of the drug, and side effects. Includes the steps to properly store meds and maintain the prescription regimen. (beta blockers and nitrates)   Cardiac Rehab from 11/26/2016 in [facility] Cardiac and Pulmonary Rehab  Date  11/26/16  Educator  [facility]  Instruction Review Code  5- Refused Teaching      Cardiac Medications I: - Group verbal and written instruction to review commonly prescribed medications for heart disease. Reviews the medication, class of the drug, and side effects. Includes the steps to properly store meds and maintain the prescription regimen.   Cardiac Rehab from 11/26/2016 in [facility] Cardiac and Pulmonary Rehab  Date  10/08/16  Educator  [facility]  Instruction Review Code  1- Verbalizes Understanding      Cardiac Medications II: -Group verbal and written instruction to review commonly prescribed medications for heart disease. Reviews the medication, class of the drug, and side effects.  (all other drug classes)    Go Sex-Intimacy & Heart Disease, Get SMART - Goal Setting: - Group verbal and written instruction through game format to discuss heart disease and the return to sexual intimacy. Provides group verbal and written material to discuss and apply goal setting through the application of the S.M.A.R.T. Method.   Cardiac Rehab from 11/26/2016 in [facility] Cardiac and Pulmonary Rehab  Date  11/26/16  Educator  CE  Instruction Review Code  5- Refused Teaching      Other Matters of the Heart: - Provides group verbal, written materials and models to describe Heart Failure, Angina, Valve Disease, Peripheral Artery Disease, and Diabetes in the realm of heart disease. Includes description of the disease process and treatment options available to the cardiac patient.   Exercise & Equipment Safety: - Individual verbal instruction and demonstration of equipment use and safety with use of the equipment.   Cardiac Rehab from 11/26/2016 in [facility] Cardiac and Pulmonary Rehab  Date  09/27/16  Educator  [facility]  Instruction Review Code  1- Verbalizes Understanding      Infection Prevention: - Provides verbal and written material to individual with discussion of infection control including proper hand washing and proper equipment cleaning during exercise session.   Cardiac Rehab from 11/26/2016 in [facility] Cardiac and Pulmonary Rehab  Date  09/27/16  Educator  [facility]  Instruction Review Code  1- Verbalizes Understanding      Falls Prevention: - Provides verbal and written material to individual with discussion of falls prevention and safety.   Cardiac Rehab from 11/26/2016 in [facility] Cardiac and Pulmonary Rehab  Date  09/27/16  Educator  [facility]  Instruction Review Code  1- Verbalizes Understanding      Diabetes: - Individual verbal and written instruction to review signs/symptoms of diabetes, desired ranges of glucose level fasting, after meals and with exercise. Acknowledge that pre and post  exercise glucose checks will be done for 3 sessions at entry of program.   Other: -Provides group and verbal instruction on various topics (see comments)    Knowledge Questionnaire Score: Knowledge Questionnaire Score - 12/10/16 1258      Knowledge Questionnaire Score   Pre Score  27/28    Post Score  27/28       Core Components/Risk Factors/Patient Goals at Admission: Personal Goals and Risk Factors at Admission - 09/27/16 1403  Core Components/Risk Factors/Patient Goals on Admission   Heart Failure  Yes    Intervention  Provide a combined exercise and nutrition program that is supplemented with education, support and counseling about heart failure. Directed toward relieving symptoms such as shortness of breath, decreased exercise tolerance, and extremity edema.    Expected Outcomes  Improve functional capacity of life;Short term: Attendance in program 2-3 days a week with increased exercise capacity. Reported lower sodium intake. Reported increased fruit and vegetable intake. Reports medication compliance.;Short term: Daily weights obtained and reported for increase. Utilizing diuretic protocols set by physician.;Long term: Adoption of self-care skills and reduction of barriers for early signs and symptoms recognition and intervention leading to self-care maintenance.    Hypertension  Yes    Intervention  Provide education on lifestyle modifcations including regular physical activity/exercise, weight management, moderate sodium restriction and increased consumption of fresh fruit, vegetables, and low fat dairy, alcohol moderation, and smoking cessation.;Monitor prescription use compliance.    Expected Outcomes  Short Term: Continued assessment and intervention until BP is < 140/60m HG in hypertensive participants. < 130/868mHG in hypertensive participants with diabetes, heart failure or chronic kidney disease.;Long Term: Maintenance of blood pressure at goal levels.    Lipids  Yes     Intervention  Provide education and support for participant on nutrition & aerobic/resistive exercise along with prescribed medications to achieve LDL <7063mHDL >48m64m  Expected Outcomes  Short Term: Participant states understanding of desired cholesterol values and is compliant with medications prescribed. Participant is following exercise prescription and nutrition guidelines.;Long Term: Cholesterol controlled with medications as prescribed, with individualized exercise RX and with personalized nutrition plan. Value goals: LDL < 70mg58mL > 40 mg.       Core Components/Risk Factors/Patient Goals Review:  Goals and Risk Factor Review    Row Name 10/17/16 0613 18296/18 0801           Core Components/Risk Factors/Patient Goals Review   Personal Goals Review  Weight Management/Obesity;Hypertension;Lipids  Weight Management/Obesity;Hypertension;Lipids      Review  Hypertension lipids and weight are stable.  Ed's weight is up only a few pounds and now he is trying to hold steady.  His blood pressure has been good.  Ed does not check his pressure as much since he is here three times a week.  He had some medication changes and all seems to be well now.  He got his heart monitor off yesterday and was able to d/c his ellipuis.      Expected Outcomes  Cont heart healthy living   Short: Finish his 28 visits.  Long: Continue to work on risk factor modifications.          Core Components/Risk Factors/Patient Goals at Discharge (Final Review):  Goals and Risk Factor Review - 11/02/16 0801      Core Components/Risk Factors/Patient Goals Review   Personal Goals Review  Weight Management/Obesity;Hypertension;Lipids    Review  Ed's weight is up only a few pounds and now he is trying to hold steady.  His blood pressure has been good.  Ed does not check his pressure as much since he is here three times a week.  He had some medication changes and all seems to be well now.  He got his heart monitor off  yesterday and was able to d/c his ellipuis.    Expected Outcomes  Short: Finish his 28 visits.  Long: Continue to work on risk factor modifications.  ITP Comments: ITP Comments    Row Name 09/27/16 1350 10/05/16 0944 10/17/16 0547 10/19/16 0641 10/26/16 0731   ITP Comments  Med Review completed. Initial ITP created. Diagnosis can be found in [place] Visit 8/28  Ed reports that his MD started him on a heart monitor for history of afib. Ed reports that he has not felt any of his afib lately. Cardiac Rehab monitor today showed Sinus Rhythm and Sinus Tachycardia only. Ed reported his furosemide, Amiodarone, Aspirin but to cont on Brilanta an Eliquis. 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 It is still sore rarely at times but nothing like it was.  30 Day Review continue with ITP unless directed changes per Medical Director Review.   Ed called to say he is sorry that he can not attend Cardiac Rehab today but due to the [facility] he has a tree down in his front yard and he does not have electric power currently.   Ed came to the Cardiac Rehab gym today to ask [place] about the blood in his urine that started last night. Ed is on 2 blood thinners which he did not take this am. 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 He had some afib after his surgery so that is why he is on blood thinner took. I asked him to call his doctors office and speak to the nurse about the blood in his urine.    [place] Name 11/14/16 0617 11/19/16 1258 12/05/16 1459 12/07/16 1110 12/10/16 1258   ITP Comments  30 day review. Continue with ITP unless directed changes per Medical Director review.   Called to check on status of return.   He has been out sick, it may have the flu as symptoms were similar.  He hopes to return on Wednesday.   Ed called and left a message this morning that he would not be able to attend due to a medical bill  that was not covered by his insurance that he is trying to take care of.   Returned call to check on patient. Wednesday Ed left a message about not attending due to insurance issues and filing claims.  He still did not come to class today still.  So called to check on him.  They are still resolving insurance claims. At this point, he has done his walk test and his paperwork.  He is going to drop off his paperwork on Monday and will graduate early.   Ed dropped off his homework.  He was discharged today and ITP sent to Dr. Sabra Heck and discharge summary send to PCP and Cardiologist.       Comments: Discharge ITP

## 2016-12-10 NOTE — Progress Notes (Signed)
Discharge Progress Report  Patient Details  Name: Shawn Elliott MRN: [id number] Date of Birth: [birth year] Referring Provider:     Cardiac Rehab from 09/27/2016 in [facility]  Referring Provider  Henke       Number of Visits: 22/28  Reason for Discharge:  Patient reached a stable level of exercise. Patient independent in their exercise. Patient has met program and personal goals. Early Exit:  Insurance  Smoking History:  Social History   Tobacco Use  Smoking Status Former Smoker  . Types: Pipe  . Last attempt to quit: 1982  . Years since quitting: 36.9  Smokeless Tobacco Never Used    Diagnosis:  No diagnosis found.  ADL UCSD:   Initial Exercise Prescription: Initial Exercise Prescription - 09/27/16 1400      Date of Initial Exercise RX and Referring Provider   Date  09/27/16    Referring Provider  Henke      Treadmill   MPH  2.4    Grade  1.5    Minutes  15    METs  3.33      Recumbant Bike   Level  5    RPM  60    Watts  45    Minutes  15    METs  3.35      REL-XR   Level  3    Speed  50    Minutes  15    METs  3.3      T5 Nustep   Level  3    SPM  80    Minutes  15    METs  3.3      Prescription Details   Frequency (times per week)  3    Duration  Progress to 45 minutes of aerobic exercise without signs/symptoms of physical distress      Intensity   THRR 40-80% of Max Heartrate  97-139    Ratings of Perceived Exertion  11-13    Perceived Dyspnea  0-4      Resistance Training   Training Prescription  Yes    Weight  3    Reps  10-15       Discharge Exercise Prescription (Final Exercise Prescription Changes): Exercise Prescription Changes - 12/05/16 1500      Response to Exercise   Blood Pressure (Admit)  100/62    Blood Pressure (Exercise)  136/70    Blood Pressure (Exit)  122/60    Heart Rate (Admit)  72 bpm    Heart Rate (Exercise)  89 bpm    Heart Rate (Exit)  61 bpm    Rating of Perceived Exertion  (Exercise)  13    Symptoms  none    Duration  Continue with 45 min of aerobic exercise without signs/symptoms of physical distress.    Intensity  THRR unchanged      Progression   Progression  Continue to progress workloads to maintain intensity without signs/symptoms of physical distress.    Average METs  4.18      Resistance Training   Training Prescription  Yes    Weight  6 lbs    Reps  10-15      Interval Training   Interval Training  Yes    Equipment  NuStep;Recumbant Bike    Comments  4mn off 30 sec on      Treadmill   MPH  3    Grade  3    Minutes  15  METs  4.54      Recumbant Bike   Level  6    Watts  46    Minutes  15    METs  5.4      T5 Nustep   Level  3    Minutes  15    METs  2.6      Home Exercise Plan   Plans to continue exercise at  Home (comment) walking    Frequency  Add 3 additional days to program exercise sessions. already walks 30 min per day most days    Initial Home Exercises Provided  10/05/16       Functional Capacity: 6 Minute Walk    Row Name 09/27/16 1422 11/26/16 0815       6 Minute Walk   Phase  -  Discharge    Distance  1300 feet  1712 feet    Distance % Change  -  31.7 %    Distance Feet Change  -  412 ft    Walk Time  6 minutes  6 minutes    # of Rest Breaks  0  0    MPH  2.46  3.24    METS  3.42  4.26    RPE  9  13    Perceived Dyspnea   1  -    VO2 Peak  11.99  14.9    Symptoms  No  Yes (comment)    Comments  -  shins burning at end of test    Resting HR  56 bpm  58 bpm    Resting BP  126/78  118/72    Resting Oxygen Saturation   97 %  -    Exercise Oxygen Saturation  during 6 min walk  97 %  99 %    Max Ex. HR  97 bpm  107 bpm    Max Ex. BP  138/76  142/72    2 Minute Post BP  134/70  -       Psychological, QOL, Others - Outcomes: PHQ 2/9: Depression screen [facility] 2/9 12/10/2016 09/27/2016 02/06/2016  Decreased Interest 0 0 0  Down, Depressed, Hopeless 0 0 0  PHQ - 2 Score 0 0 0  Altered sleeping 1 1 0   Tired, decreased energy 1 1 0  Change in appetite 1 0 0  Feeling bad or failure about yourself  0 0 0  Trouble concentrating 0 0 0  Moving slowly or fidgety/restless 0 0 0  Suicidal thoughts 0 0 0  PHQ-9 Score 3 2 0  Difficult doing work/chores Not difficult at all Not difficult at all Not difficult at all    Quality of Elliott: Quality of Elliott - 12/10/16 1259      Quality of Elliott Scores   Health/Function Pre  20.86 %    Health/Function Post  24.5 %    Health/Function % Change  17.45 %    Socioeconomic Pre  20.63 %    Socioeconomic Post  24.38 %    Socioeconomic % Change   18.18 %    Psych/Spiritual Pre  20.71 %    Psych/Spiritual Post  28.29 %    Psych/Spiritual % Change  36.6 %    Family Pre  21 %    Family Post  26.4 %    Family % Change  25.71 %    GLOBAL Pre  20.79 %    GLOBAL Post  25.5 %    GLOBAL %  Change  22.66 %       Personal Goals: Goals established at orientation with interventions provided to work toward goal. Personal Goals and Risk Factors at Admission - 09/27/16 1403      Core Components/Risk Factors/Patient Goals on Admission   Heart Failure  Yes    Intervention  Provide a combined exercise and nutrition program that is supplemented with education, support and counseling about heart failure. Directed toward relieving symptoms such as shortness of breath, decreased exercise tolerance, and extremity edema.    Expected Outcomes  Improve functional capacity of Elliott;Short term: Attendance in program 2-3 days a week with increased exercise capacity. Reported lower sodium intake. Reported increased fruit and vegetable intake. Reports medication compliance.;Short term: Daily weights obtained and reported for increase. Utilizing diuretic protocols set by physician.;Long term: Adoption of self-care skills and reduction of barriers for early signs and symptoms recognition and intervention leading to self-care maintenance.    Hypertension  Yes    Intervention  Provide  education on lifestyle modifcations including regular physical activity/exercise, weight management, moderate sodium restriction and increased consumption of fresh fruit, vegetables, and low fat dairy, alcohol moderation, and smoking cessation.;Monitor prescription use compliance.    Expected Outcomes  Short Term: Continued assessment and intervention until BP is < 140/82m HG in hypertensive participants. < 130/865mHG in hypertensive participants with diabetes, heart failure or chronic kidney disease.;Long Term: Maintenance of blood pressure at goal levels.    Lipids  Yes    Intervention  Provide education and support for participant on nutrition & aerobic/resistive exercise along with prescribed medications to achieve LDL <7082mHDL >21m55m  Expected Outcomes  Short Term: Participant states understanding of desired cholesterol values and is compliant with medications prescribed. Participant is following exercise prescription and nutrition guidelines.;Long Term: Cholesterol controlled with medications as prescribed, with individualized exercise RX and with personalized nutrition plan. Value goals: LDL < 70mg26mL > 40 mg.        Personal Goals Discharge: Goals and Risk Factor Review    Row Name 10/17/16 0613 [id number]           Core Components/Risk Factors/Patient Goals Review   Personal Goals Review  Weight Management/Obesity;Hypertension;Lipids  Weight Management/Obesity;Hypertension;Lipids      Review  Hypertension lipids and weight are stable.  Ed's weight is up only a few pounds and now he is trying to hold steady.  His blood pressure has been good.  Ed does not check his pressure as much since he is here three times a week.  He had some medication changes and all seems to be well now.  He got his heart monitor off yesterday and was able to d/c his ellipuis.      Expected Outcomes  Cont heart healthy living   Short: Finish his 28 visits.  Long: Continue to work on risk factor  modifications.          Exercise Goals and Review: Exercise Goals    Row Name 09/27/16 1422             Exercise Goals   Increase Physical Activity  Yes       Intervention  Provide advice, education, support and counseling about physical activity/exercise needs.;Develop an individualized exercise prescription for aerobic and resistive training based on initial evaluation findings, risk stratification, comorbidities and participant's personal goals.       Expected Outcomes  Achievement of increased cardiorespiratory fitness and enhanced flexibility, muscular endurance and strength shown through measurements  of functional capacity and personal statement of participant.       Increase Strength and Stamina  Yes       Intervention  Provide advice, education, support and counseling about physical activity/exercise needs.;Develop an individualized exercise prescription for aerobic and resistive training based on initial evaluation findings, risk stratification, comorbidities and participant's personal goals.       Expected Outcomes  Achievement of increased cardiorespiratory fitness and enhanced flexibility, muscular endurance and strength shown through measurements of functional capacity and personal statement of participant.       Able to understand and use rate of perceived exertion (RPE) scale  Yes       Intervention  Provide education and explanation on how to use RPE scale       Expected Outcomes  Short Term: Able to use RPE daily in rehab to express subjective intensity level;Long Term:  Able to use RPE to guide intensity level when exercising independently       Able to understand and use Dyspnea scale  Yes       Intervention  Provide education and explanation on how to use Dyspnea scale       Expected Outcomes  Short Term: Able to use Dyspnea scale daily in rehab to express subjective sense of shortness of breath during exertion;Long Term: Able to use Dyspnea scale to guide intensity level  when exercising independently       Knowledge and understanding of Target Heart Rate Range (THRR)  Yes       Intervention  Provide education and explanation of THRR including how the numbers were predicted and where they are located for reference       Expected Outcomes  Short Term: Able to state/look up THRR;Long Term: Able to use THRR to govern intensity when exercising independently;Short Term: Able to use daily as guideline for intensity in rehab       Able to check pulse independently  Yes       Intervention  Provide education and demonstration on how to check pulse in carotid and radial arteries.;Review the importance of being able to check your own pulse for safety during independent exercise       Expected Outcomes  Short Term: Able to explain why pulse checking is important during independent exercise;Long Term: Able to check pulse independently and accurately       Understanding of Exercise Prescription  Yes       Intervention  Provide education, explanation, and written materials on patient's individual exercise prescription       Expected Outcomes  Short Term: Able to explain program exercise prescription;Long Term: Able to explain home exercise prescription to exercise independently          Nutrition & Weight - Outcomes: Pre Biometrics - 09/27/16 1413      Pre Biometrics   Height  _0  (1.854 m)    Weight  222 lb 14.4 oz (101.1 kg)    Waist Circumference  42 inches    Hip Circumference  42.5 inches    Waist to Hip Ratio  0.99 %    BMI (Calculated)  29.41    Single Leg Stand  16.78 seconds        Nutrition: Nutrition Therapy & Goals - 11/02/16 0807      Nutrition Therapy   RD appointment defered  Yes       Nutrition Discharge: Nutrition Assessments - 12/10/16 1258      MEDFICTS Scores   Pre Score  24    Post Score  32    Score Difference  8       Education Questionnaire Score: Knowledge Questionnaire Score - 12/10/16 1258      Knowledge Questionnaire  Score   Pre Score  27/28    Post Score  27/28       Goals reviewed with patient; copy given to patient.

## 2016-12-12 ENCOUNTER — Encounter: Payer: Self-pay | Admitting: *Deleted

## 2017-05-29 ENCOUNTER — Emergency Department (HOSPITAL_COMMUNITY): Payer: Managed Care, Other (non HMO)

## 2017-05-29 ENCOUNTER — Observation Stay (HOSPITAL_COMMUNITY)
Admission: EM | Admit: 2017-05-29 | Discharge: 2017-05-29 | Disposition: A | Discharge status: Home / Self Care | Payer: Managed Care, Other (non HMO) | Attending: Cardiovascular Disease | Admitting: Cardiovascular Disease

## 2017-05-29 ENCOUNTER — Encounter (HOSPITAL_COMMUNITY): Payer: Self-pay | Admitting: Emergency Medicine

## 2017-05-29 DIAGNOSIS — R1013 Epigastric pain: Secondary | ICD-10-CM | POA: Diagnosis present

## 2017-05-29 DIAGNOSIS — I110 Hypertensive heart disease with heart failure: Secondary | ICD-10-CM | POA: Diagnosis not present

## 2017-05-29 DIAGNOSIS — R072 Precordial pain: Secondary | ICD-10-CM | POA: Diagnosis not present

## 2017-05-29 DIAGNOSIS — R079 Chest pain, unspecified: Principal | ICD-10-CM | POA: Diagnosis present

## 2017-05-29 DIAGNOSIS — E7800 Pure hypercholesterolemia, unspecified: Secondary | ICD-10-CM | POA: Insufficient documentation

## 2017-05-29 DIAGNOSIS — Z7982 Long term (current) use of aspirin: Secondary | ICD-10-CM | POA: Diagnosis not present

## 2017-05-29 DIAGNOSIS — I5043 Acute on chronic combined systolic (congestive) and diastolic (congestive) heart failure: Secondary | ICD-10-CM | POA: Diagnosis not present

## 2017-05-29 DIAGNOSIS — I252 Old myocardial infarction: Secondary | ICD-10-CM | POA: Diagnosis not present

## 2017-05-29 DIAGNOSIS — E785 Hyperlipidemia, unspecified: Secondary | ICD-10-CM | POA: Insufficient documentation

## 2017-05-29 DIAGNOSIS — Z79899 Other long term (current) drug therapy: Secondary | ICD-10-CM | POA: Diagnosis not present

## 2017-05-29 DIAGNOSIS — E669 Obesity, unspecified: Secondary | ICD-10-CM | POA: Insufficient documentation

## 2017-05-29 DIAGNOSIS — Z87891 Personal history of nicotine dependence: Secondary | ICD-10-CM | POA: Diagnosis not present

## 2017-05-29 DIAGNOSIS — I4891 Unspecified atrial fibrillation: Secondary | ICD-10-CM | POA: Insufficient documentation

## 2017-05-29 DIAGNOSIS — Z8249 Family history of ischemic heart disease and other diseases of the circulatory system: Secondary | ICD-10-CM | POA: Insufficient documentation

## 2017-05-29 DIAGNOSIS — K219 Gastro-esophageal reflux disease without esophagitis: Secondary | ICD-10-CM

## 2017-05-29 DIAGNOSIS — I25110 Atherosclerotic heart disease of native coronary artery with unstable angina pectoris: Secondary | ICD-10-CM | POA: Diagnosis not present

## 2017-05-29 DIAGNOSIS — Z6829 Body mass index (BMI) 29.0-29.9, adult: Secondary | ICD-10-CM | POA: Insufficient documentation

## 2017-05-29 DIAGNOSIS — I5042 Chronic combined systolic (congestive) and diastolic (congestive) heart failure: Secondary | ICD-10-CM

## 2017-05-29 DIAGNOSIS — Z955 Presence of coronary angioplasty implant and graft: Secondary | ICD-10-CM | POA: Insufficient documentation

## 2017-05-29 DIAGNOSIS — Z951 Presence of aortocoronary bypass graft: Secondary | ICD-10-CM | POA: Diagnosis not present

## 2017-05-29 LAB — CBC
HEMATOCRIT: 44.4 % (ref 39.0–52.0)
Hemoglobin: 14.9 g/dL (ref 13.0–17.0)
MCH: 30.8 pg (ref 26.0–34.0)
MCHC: 33.6 g/dL (ref 30.0–36.0)
MCV: 91.7 fL (ref 78.0–100.0)
PLATELETS: 196 10*3/uL (ref 150–400)
RBC: 4.84 MIL/uL (ref 4.22–5.81)
RDW: 12.4 % (ref 11.5–15.5)
WBC: 10 10*3/uL (ref 4.0–10.5)

## 2017-05-29 LAB — HEPATIC FUNCTION PANEL
ALK PHOS: 122 U/L (ref 38–126)
ALT: 62 U/L (ref 17–63)
AST: 75 U/L — ABNORMAL HIGH (ref 15–41)
Albumin: 4.4 g/dL (ref 3.5–5.0)
BILIRUBIN DIRECT: 0.2 mg/dL (ref 0.1–0.5)
BILIRUBIN INDIRECT: 0.7 mg/dL (ref 0.3–0.9)
Total Bilirubin: 0.9 mg/dL (ref 0.3–1.2)
Total Protein: 7.6 g/dL (ref 6.5–8.1)

## 2017-05-29 LAB — BASIC METABOLIC PANEL
Anion gap: 12 (ref 5–15)
BUN: 27 mg/dL — ABNORMAL HIGH (ref 6–20)
CO2: 22 mmol/L (ref 22–32)
CREATININE: 1.43 mg/dL — AB (ref 0.61–1.24)
Calcium: 9.6 mg/dL (ref 8.9–10.3)
Chloride: 107 mmol/L (ref 101–111)
GFR calc Af Amer: 60 mL/min — ABNORMAL LOW (ref >60)
GFR, EST NON AFRICAN AMERICAN: 51 mL/min — AB (ref >60)
GLUCOSE: 112 mg/dL — AB (ref 65–99)
POTASSIUM: 3.9 mmol/L (ref 3.5–5.1)
SODIUM: 141 mmol/L (ref 135–145)

## 2017-05-29 LAB — I-STAT TROPONIN, ED: Troponin i, poc: 0 ng/mL (ref 0.00–0.08)

## 2017-05-29 LAB — LIPASE, BLOOD: LIPASE: 36 U/L (ref 11–51)

## 2017-05-29 LAB — TROPONIN I
Troponin I: <0.03 ng/mL (ref <0.03)
Troponin I: <0.03 ng/mL (ref <0.03)

## 2017-05-29 MED ORDER — LISINOPRIL 10 MG PO TABS: 5.0000 mg | ORAL_TABLET | Freq: Every day | ORAL | Status: DC

## 2017-05-29 MED ORDER — MORPHINE SULFATE (PF) 4 MG/ML IV SOLN
4.0000 mg | Freq: Once | INTRAVENOUS | Status: AC
  Administered 2017-05-29: 4 mg via INTRAVENOUS
  Filled 2017-05-29: qty 1

## 2017-05-29 MED ORDER — ASPIRIN 300 MG RE SUPP: 300.0000 mg | RECTAL | Status: AC

## 2017-05-29 MED ORDER — ASPIRIN 81 MG PO CHEW
324.0000 mg | CHEWABLE_TABLET | ORAL | Status: AC
  Administered 2017-05-29: 324 mg via ORAL
  Filled 2017-05-29: qty 4

## 2017-05-29 MED ORDER — HEPARIN BOLUS VIA INFUSION
4000.0000 [IU] | Freq: Once | INTRAVENOUS | Status: AC
  Administered 2017-05-29: 4000 [IU] via INTRAVENOUS
  Filled 2017-05-29: qty 4000

## 2017-05-29 MED ORDER — ONDANSETRON HCL 4 MG/2ML IJ SOLN: 4.0000 mg | Freq: Four times a day (QID) | INTRAMUSCULAR | Status: DC | PRN

## 2017-05-29 MED ORDER — ASPIRIN EC 81 MG PO TBEC: 81.0000 mg | DELAYED_RELEASE_TABLET | Freq: Every day | ORAL | Status: DC

## 2017-05-29 MED ORDER — ACETAMINOPHEN 325 MG PO TABS
650.0000 mg | ORAL_TABLET | ORAL | Status: DC | PRN
Start: 2017-05-29 — End: 2017-05-29

## 2017-05-29 MED ORDER — HEPARIN (PORCINE) IN NACL 100-0.45 UNIT/ML-% IJ SOLN
1250.0000 [IU]/h | INTRAMUSCULAR | Status: DC
  Administered 2017-05-29: 1250 [IU]/h via INTRAVENOUS
  Filled 2017-05-29: qty 250

## 2017-05-29 MED ORDER — GI COCKTAIL ~~LOC~~
30.0000 mL | Freq: Once | ORAL | Status: AC
  Administered 2017-05-29: 30 mL via ORAL
  Filled 2017-05-29: qty 30

## 2017-05-29 MED ORDER — NITROGLYCERIN IN D5W 200-5 MCG/ML-% IV SOLN
0.0000 ug/min | Freq: Once | INTRAVENOUS | Status: DC
  Filled 2017-05-29: qty 250

## 2017-05-29 MED ORDER — ATORVASTATIN CALCIUM 80 MG PO TABS: 80.0000 mg | ORAL_TABLET | Freq: Every day | ORAL | Status: DC

## 2017-05-29 MED ORDER — NITROGLYCERIN 0.4 MG SL SUBL: 0.4000 mg | SUBLINGUAL_TABLET | SUBLINGUAL | Status: DC | PRN

## 2017-05-29 MED ORDER — SPIRONOLACTONE 25 MG PO TABS
25.0000 mg | ORAL_TABLET | Freq: Every day | ORAL | Status: DC
  Filled 2017-05-29: qty 1

## 2017-05-29 MED ORDER — ONDANSETRON HCL 4 MG/2ML IJ SOLN
4.0000 mg | Freq: Once | INTRAMUSCULAR | Status: AC
  Administered 2017-05-29: 4 mg via INTRAVENOUS
  Filled 2017-05-29: qty 2

## 2017-05-29 NOTE — H&P (Addendum)
Cardiology Consultation:   Patient ID: Shawn Elliott; [id number]; 08-Nov-1955   Admit date: 05/29/2017 Date of Consult: 05/29/2017  Primary Care Provider: Manfred Arch, MD Primary Cardiologist: Vita Erm, MD   Chief Complaint: Epigastric pain  Patient Profile:   Shawn Elliott is a 62 y.o. male with a hx of CAD status post PCI and most recently CABG in 08/2016, chronic systolic heart failure with EF 30%, hypertension, hyperlipidemia, GERD who presents with epigastric pain.   History of Present Illness:   Patient developed acute epigastric and right upper quadrant pain this morning at 12:30 AM while sitting in his recliner.  Also developed nausea and diaphoresis.  He notes that the epigastric pain was different than his prior angina, but the nausea and diaphoresis made him concerned for possible heart attack so he called EMS.  Eventually he drove himself to [facility] emergency room.  EKG showed sinus rhythm heart rate 56 and prior old inferior infarct without any acute ST segment changes.  Troponin drawn at 2:15 AM was 0.  Patient received a GI cocktail in the emergency room with some improvement in his symptoms.  He has otherwise felt well recently with no chest discomfort, dyspnea, or weight gain.   Past Medical History:  Diagnosis Date  . Coronary artery disease   . GERD (gastroesophageal reflux disease)   . High cholesterol   . History of kidney stones    "passed them" (01/17/2016)  . Hypertension   . PONV (postoperative nausea and vomiting)    "when I woke up from my hernia I was nauseated"  . STEMI (ST elevation myocardial infarction) (HCC) 01/12/2016    Past Surgical History:  Procedure Laterality Date  . CARDIAC CATHETERIZATION N/A 01/12/2016   Procedure: Left Heart Cath and Coronary Angiography;  Surgeon: Lennette Bihari, MD;  Location[facility] INVASIVE CV LAB;  Service: Cardiovascular;  Laterality: N/A;  . CARDIAC CATHETERIZATION N/A 01/12/2016   Procedure:  Coronary Stent Intervention;  Surgeon: Lennette Bihari, MD;  Location: [facility] INVASIVE CV LAB;  Service: Cardiovascular;  Laterality: N/A;  . CARDIAC CATHETERIZATION N/A 01/17/2016   Procedure: Coronary Stent Intervention;  Surgeon: Lennette Bihari, MD;  Location: [facility] INVASIVE CV LAB;  Service: Cardiovascular;  Laterality: N/A;  . CARDIAC CATHETERIZATION N/A 01/17/2016   Procedure: Coronary Balloon Angioplasty;  Surgeon: Lennette Bihari, MD;  Location: [facility] INVASIVE CV LAB;  Service: Cardiovascular;  Laterality: N/A;  . COLONOSCOPY  ~ 2008  . CORONARY ANGIOPLASTY WITH STENT PLACEMENT  01/17/2016   "3 01/12/2016; 3 01/17/2016"  . GANGLION CYST EXCISION Right ~ 2016  . INGUINAL HERNIA REPAIR Right ~ 2014  . kidney stones  2010     Inpatient Medications: Scheduled Meds: . aspirin  324 mg Oral NOW   Or  . aspirin  300 mg Rectal NOW  . aspirin EC  81 mg Oral Daily  . [START ON 05/30/2017] aspirin EC  81 mg Oral Daily  . atorvastatin  80 mg Oral QHS  . lisinopril  5 mg Oral Daily  . spironolactone  25 mg Oral Daily   Continuous Infusions: . heparin 1,250 Units/hr (05/29/17 0502)  . nitroGLYCERIN     PRN Meds:   Home Meds: Prior to Admission medications   Medication Sig Start Date End Date Taking? Authorizing Provider  aspirin EC 81 MG tablet Take 81 mg by mouth daily.   Yes [provider]  atorvastatin (LIPITOR) 80 MG tablet Take 1 tablet (80 mg total) by mouth  daily at 6 PM. Patient taking differently: Take 80 mg by mouth at bedtime.  02/06/16  Yes Almond Lint, MD  lisinopril (PRINIVIL,ZESTRIL) 5 MG tablet Take 5 mg by mouth daily.   Yes [provider]  metoprolol succinate (TOPROL-XL) 100 MG 24 hr tablet Take 100 mg by mouth daily. Take with or immediately following a meal.   Yes [provider]  nitroGLYCERIN (NITROSTAT) 0.4 MG SL tablet Place 1 tablet (0.4 mg total) under the tongue every 5 (five) minutes as needed for chest pain. 02/02/16  Yes Almond Lint, MD    omeprazole (PRILOSEC) 20 MG capsule Take 20 mg by mouth daily. 12/04/15  Yes [provider]  spironolactone (ALDACTONE) 25 MG tablet Take 25 mg by mouth daily.   Yes [provider]  albuterol (PROVENTIL HFA) 108 (90 Base) MCG/ACT inhaler Inhale 2 puffs into the lungs every 4 (four) hours as needed for wheezing or shortness of breath. Patient not taking: Reported on 09/27/2016 06/27/16   Sharman Cheek, MD  isosorbide mononitrate (IMDUR) 30 MG 24 hr tablet Take 1 tablet (30 mg total) by mouth daily. Patient not taking: Reported on 05/29/2017 02/03/16   Almond Lint, MD  lisinopril (PRINIVIL,ZESTRIL) 2.5 MG tablet Take 1 tablet (2.5 mg total) by mouth daily. Patient not taking: Reported on 05/29/2017 02/02/16   Almond Lint, MD  metoprolol tartrate (LOPRESSOR) 25 MG tablet Take 0.5 tablets (12.5 mg total) by mouth 2 (two) times daily. Patient not taking: Reported on 05/29/2017 02/02/16   Almond Lint, MD  predniSONE (DELTASONE) 20 MG tablet Take 2 tablets (40 mg total) by mouth daily. Patient not taking: Reported on 09/27/2016 06/27/16   Sharman Cheek, MD  ticagrelor (BRILINTA) 90 MG TABS tablet Take 1 tablet (90 mg total) by mouth 2 (two) times daily. Patient not taking: Reported on 05/29/2017 02/02/16   Almond Lint, MD    Allergies:    Allergies  Allergen Reactions  . Penicillins Hives    Childhood allergic reaction Has patient had a PCN reaction causing immediate rash, facial/tongue/throat swelling, SOB or lightheadedness with hypotension: Yes Has patient had a PCN reaction causing severe rash involving mucus membranes or skin necrosis: No Has patient had a PCN reaction that required hospitalization No Has patient had a PCN reaction occurring within the last 10 years: No If all of the above answers are "NO", then may proceed with Cephalosporin use.    Social History:   Social History   Socioeconomic History  . Marital status: Married    Spouse name: Not on file   . Number of children: Not on file  . Years of education: Not on file  . Highest education level: Not on file  Occupational History  . Not on file  Social Needs  . Financial resource strain: Not on file  . Food insecurity:    Worry: Not on file    Inability: Not on file  . Transportation needs:    Medical: Not on file    Non-medical: Not on file  Tobacco Use  . Smoking status: Former Smoker    Types: Pipe    Last attempt to quit: 1982    Years since quitting: 37.4  . Smokeless tobacco: Never Used  Substance and Sexual Activity  . Alcohol use: Yes    Alcohol/week: 0.6 oz    Types: 1 Cans of beer per week  . Drug use: No  . Sexual activity: Not Currently  Lifestyle  . Physical activity:    Days  per week: Not on file    Minutes per session: Not on file  . Stress: Not on file  Relationships  . Social connections:    Talks on phone: Not on file    Gets together: Not on file    Attends religious service: Not on file    Active member of club or organization: Not on file    Attends meetings of clubs or organizations: Not on file    Relationship status: Not on file  . Intimate partner violence:    Fear of current or ex partner: Not on file    Emotionally abused: Not on file    Physically abused: Not on file    Forced sexual activity: Not on file  Other Topics Concern  . Not on file  Social History Narrative  . Not on file    Family History:   The patient's family history includes Diabetes in his brother, father, and mother; Heart attack in his mother; Hyperlipidemia in his brother, father, mother, and sister; Hypertension in his brother, father, mother, and sister; Stroke in his father and mother.  ROS:  Please see the history of present illness.  All other ROS reviewed and negative.     Physical Exam/Data:   Vitals:   05/29/17 0445 05/29/17 0500 05/29/17 0530 05/29/17 0545  BP: (!) 156/82 (!) 142/78 131/72 117/63  Pulse: (!) 57 (!) 58 64 65  Resp: 16 17 14 20    Temp:      TempSrc:      SpO2: 100% 99% 97% 96%  Weight:      Height:       No intake or output data in the 24 hours ending 05/29/17 0620 Filed Weights   05/29/17 0203  Weight: 104.3 kg (230 lb)   Body mass index is 29.53 kg/m.  General: Well developed, well nourished, in no acute distress. Head: Normocephalic, atraumatic, sclera non-icteric, no xanthomas, nares are without discharge.  Neck: Negative for carotid bruits. JVD not elevated. Lungs: Clear bilaterally to auscultation without wheezes, rales, or rhonchi. Breathing is unlabored. Heart: RRR with S1 S2. No murmurs, rubs, or gallops appreciated. Abdomen: Soft, non-tender, non-distended with normoactive bowel sounds. No hepatomegaly. No rebound/guarding. No obvious abdominal masses. Msk:  Strength and tone appear normal for age. Extremities: No clubbing or cyanosis. 1+ edema.  Distal pedal pulses are 2+ and equal bilaterally. Neuro: Alert and oriented X 3. No facial asymmetry. No focal deficit. Moves all extremities spontaneously. Psych:  Responds to questions appropriately with a normal affect.  EKG:  The EKG showed sinus rhythm heart rate 56 and prior old inferior infarct without any acute ST segment changes.   Relevant CV Studies: Echo 04/17/2017 [facility].  EF 30%, mild MR.  LVIDD 6 cm.  Laboratory Data:  Chemistry Recent Labs  Lab 05/29/17 0206  NA 141  K 3.9  CL 107  CO2 22  GLUCOSE 112*  BUN 27*  CREATININE 1.43*  CALCIUM 9.6  GFRNONAA 51*  GFRAA 60*  ANIONGAP 12    Recent Labs  Lab 05/29/17 0412  PROT 7.6  ALBUMIN 4.4  AST 75*  ALT 62  ALKPHOS 122  BILITOT 0.9   Hematology Recent Labs  Lab 05/29/17 0206  WBC 10.0  RBC 4.84  HGB 14.9  HCT 44.4  MCV 91.7  MCH 30.8  MCHC 33.6  RDW 12.4  PLT 196   Cardiac EnzymesNo results for input(s): TROPONINI in the last 168 hours.  Recent Labs  Lab  05/29/17 0216  TROPIPOC 0.00    BNPNo results for input(s): BNP, PROBNP in the  last 168 hours.  DDimer No results for input(s): DDIMER in the last 168 hours.  Radiology/Studies:  Dg Chest 2 View  Result Date: 05/29/2017 CLINICAL DATA:  Chest pain and shortness of breath. EXAM: CHEST - 2 VIEW COMPARISON:  06/27/2016 FINDINGS: Post median sternotomy and CABG. Heart size upper normal with normal mediastinal contours. Pulmonary vasculature is normal. No consolidation, pleural effusion, or pneumothorax. No acute osseous abnormalities are seen. Incidental note of gaseous gastric distention in the upper abdomen. IMPRESSION: Post CABG without congestive failure or acute pulmonary process. Electronically Signed   By: Rubye Oaks M.D.   On: 05/29/2017 02:54    Assessment and Plan:   1. Epigastric/chest pain Location of his pain is atypical and he points more towards his gastrum and right upper quadrant.  EKG and troponin are completely normal.  His symptoms have improved some with a GI cocktail.  Suspect this may be GI in etiology.  Would plan to observe him and get 2 more sets of troponin.  If these are negative he can likely be discharged if his symptoms have resolved.  If he has recurrent symptoms can consider a stress test.  --Would get 2 more sets of troponin --Would not start heparin or IV nitroglycerin for now  2. Hypertension Will hold home metoprolol in event stress test is needed.  NPO.  3. CAD Continue aspirin, atorvastatin, lisinopril  4. Chronic systolic HF Some mild edema on exam, but no symptoms of worsening HF at home.  Not on maintenance loop diuretic.  Will continue home spironolactone.  K 3.9.  Cardiology will continue to follow.  For questions or updates, please contact [facility] Please consult [email or web address] for contact info under Cardiology/STEMI.    Signed, Electa Sniff, ADAM S, MD  05/29/2017 6:20 AM   Attending Addendum:  Mr. Devonshire is a [phone] with CAD status post PCI and CABG (08/2016), chronic systolic and diastolic heart failure (LVEF  09%), hypertension, hyperlipidemia, and GERD here with epigastric pain.  Symptoms began while sitting in his recliner.  It was associated with nausea and diaphoresis but no shortness of breath.  It did not change with exertion.  Symptoms were different than his prior episodes of angina.He called EMS and EKG revealed prior infarct and no concern for active ischemia.  Cardiac enzymes have been negative x1. He has not had any recurrent symptoms since receiving a GI cocktail.  It seems that his symptoms are most consistent with gastroesophageal reflux disease.  He is active at baseline and has no exertional symptoms.  Of note, he is scheduled to have an ICD implanted at the end of the month for primary prevention.  I recommend that he had an outpatient stress test with his cardiologist prior to having this implanted.  If his next set of cardiac enzymes is negative he will be stable for discharge.

## 2017-05-29 NOTE — ED Notes (Signed)
ED Provider at bedside. 

## 2017-05-29 NOTE — Discharge Summary (Addendum)
Discharge Summary    Patient ID: Shawn Elliott,  MRN: [id number], DOB/AGE: 1955-07-01 62 y.o.  Admit date: 05/29/2017 Discharge date: 05/29/2017  Primary Care Provider: Manfred Arch Primary Cardiologist: Vita Erm, MD   Discharge Diagnoses    Active Problems:   Chest pain   Allergies Allergies  Allergen Reactions  . Penicillins Hives    Childhood allergic reaction Has patient had a PCN reaction causing immediate rash, facial/tongue/throat swelling, SOB or lightheadedness with hypotension: Yes Has patient had a PCN reaction causing severe rash involving mucus membranes or skin necrosis: No Has patient had a PCN reaction that required hospitalization No Has patient had a PCN reaction occurring within the last 10 years: No If all of the above answers are "NO", then may proceed with Cephalosporin use.    Diagnostic Studies/Procedures    Dg Chest 2 View  Result Date: 05/29/2017 CLINICAL DATA:  Chest pain and shortness of breath. EXAM: CHEST - 2 VIEW COMPARISON:  06/27/2016 FINDINGS: Post median sternotomy and CABG. Heart size upper normal with normal mediastinal contours. Pulmonary vasculature is normal. No consolidation, pleural effusion, or pneumothorax. No acute osseous abnormalities are seen. Incidental note of gaseous gastric distention in the upper abdomen. IMPRESSION: Post CABG without congestive failure or acute pulmonary process. Electronically Signed   By: Rubye Oaks M.D.   On: 05/29/2017 02:54  _____________   History of Present Illness      CAD status post PCI and most recently CABG in 08/2016, chronic systolic heart failure with EF approximately 30%, hypertension, hyperlipidemia, GERD, who was admitted early 5/22 with chest pain.  Hospital Course     Consultants: None   Symptoms concerned him because they were unlike previous episodes of chest pain from either angina or GERD.  The chest pain was associated with nausea and diaphoresis so  he called EMS but they did not transport him.  In the emergency room, he was in sinus bradycardia with no acute ischemic changes and initial troponin was negative.  He received a GI cocktail with improvement in his symptoms.  Sublingual nitroglycerin did not make any difference.  He got morphine, and his chest pain finally resolved.  He has not had any previous episodes of exertional chest pain.  His weight has been stable, no heart failure symptoms.  He recently had an echocardiogram by his primary cardiologist which showed his EF to be 25-30%.  He is scheduled to have an ICD inserted on 06/06/2017.  His cardiac enzymes were cycled and have remained negative.  His pain has not returned.  Of note, he has prescriptions for both Imdur 30 mg and Brilinta 90 mg twice daily, but is not taking either 1 of those.  He was continued on his home dose of Toprol-XL 100 mg, aspirin 81 mg, Lipitor 80 mg and lisinopril 5 mg daily.  He does not have significant volume overload by exam, continue Aldactone 25 mg daily.  Dr. Duke Salvia evaluated him and felt no further inpatient work-up was indicated.  He is encouraged to follow-up with Dr. Sol Blazing and have a stress test prior to his ICD insertion.   [facility] was contacted by phone.  They will arrange the stress test. _____________  Discharge Vitals Blood pressure 124/78, pulse (!) 54, temperature 97.7 F (36.5 C), temperature source Oral, resp. rate 16, height 6\' 2"  (1.88 m), weight 230 lb (104.3 kg), SpO2 99 %.  Filed Weights   05/29/17 0203  Weight: 230 lb (104.3 kg)  Labs & Radiologic Studies    CBC Recent Labs    05/29/17 0206  WBC 10.0  HGB 14.9  HCT 44.4  MCV 91.7  PLT 196   Basic Metabolic Panel Recent Labs    16/10/96 0206  NA 141  K 3.9  CL 107  CO2 22  GLUCOSE 112*  BUN 27*  CREATININE 1.43*  CALCIUM 9.6   Liver Function Tests Recent Labs    05/29/17 0412  AST 75*  ALT 62  ALKPHOS 122  BILITOT 0.9    PROT 7.6  ALBUMIN 4.4   Recent Labs    05/29/17 0412  LIPASE 36   Cardiac Enzymes Troponin-I  Date Value Ref Range Status  04/09/2011 < 0.02 ng/mL Final    Comment:    0.00-0.05 0.05 ng/mL or less: NEGATIVE  Repeat testing in 3-6 hrs  if clinically indicated. >0.05 ng/mL: POTENTIAL  MYOCARDIAL INJURY. Repeat  testing in 3-6 hrs if  clinically indicated. NOTE: An increase or decrease  of 30% or more on serial  testing suggests a  clinically important change    Troponin I  Date Value Ref Range Status  05/29/2017 <0.03 <0.03 ng/mL Final    Comment:    Performed at [facility], [address] [place], [place] [address]  05/29/2017 <0.03 <0.03 ng/mL Final    Comment:    Performed at [facility], [address], [place], [place] [address]    Recent Labs    05/29/17 [phone] 05/29/17 1107  TROPONINI <0.03 <0.03   _____________   Disposition   Pt is being discharged home today in good condition.  Follow-up Plans & Appointments    Follow-up Information    Vita Erm, MD Follow up.   Specialty:  Internal Medicine Contact information: [address] [place] [address] [phone]          Discharge Instructions    Diet - low sodium heart healthy   Complete by:  As directed    Increase activity slowly   Complete by:  As directed       Discharge Medications   Allergies as of 05/29/2017      Reactions   Penicillins Hives   Childhood allergic reaction Has patient had a PCN reaction causing immediate rash, facial/tongue/throat swelling, SOB or lightheadedness with hypotension: Yes Has patient had a PCN reaction causing severe rash involving mucus membranes or skin necrosis: No Has patient had a PCN reaction that required hospitalization No Has patient had a PCN reaction occurring within the last 10 years: No If all of the above answers are "NO", then may proceed with Cephalosporin use.      Medication List     STOP taking these medications   albuterol 108 (90 Base) MCG/ACT inhaler Commonly known as:  PROVENTIL HFA   isosorbide mononitrate 30 MG 24 hr tablet Commonly known as:  IMDUR   metoprolol tartrate 25 MG tablet Commonly known as:  LOPRESSOR   predniSONE 20 MG tablet Commonly known as:  DELTASONE   ticagrelor 90 MG Tabs tablet Commonly known as:  BRILINTA     TAKE these medications   aspirin EC 81 MG tablet Take 81 mg by mouth daily.   atorvastatin 80 MG tablet Commonly known as:  LIPITOR Take 1 tablet (80 mg total) by mouth daily at 6 PM. What changed:  when to take this   lisinopril 5 MG tablet Commonly known as:  PRINIVIL,ZESTRIL Take 5 mg by mouth  daily. What changed:  Another medication with the same name was removed. Continue taking this medication, and follow the directions you see here.   metoprolol succinate 100 MG 24 hr tablet Commonly known as:  TOPROL-XL Take 100 mg by mouth daily. Take with or immediately following a meal.   nitroGLYCERIN 0.4 MG SL tablet Commonly known as:  NITROSTAT Place 1 tablet (0.4 mg total) under the tongue every 5 (five) minutes as needed for chest pain.   omeprazole 20 MG capsule Commonly known as:  PRILOSEC Take 20 mg by mouth daily.   spironolactone 25 MG tablet Commonly known as:  ALDACTONE Take 25 mg by mouth daily.          Outstanding Labs/Studies   None   Duration of Discharge Encounter   Greater than 30 minutes including physician time.  Bobbye Riggs Barrett NP 05/29/2017, 12:27 PM

## 2017-05-29 NOTE — ED Notes (Signed)
Pt verbalized understanding of discharge instructions and denies any further questions at this time.   

## 2017-05-29 NOTE — Progress Notes (Signed)
ANTICOAGULATION CONSULT NOTE - Initial Consult  Pharmacy Consult for Heparin Indication: chest pain/ACS  Allergies  Allergen Reactions  . Penicillins Hives    Childhood allergic reaction Has patient had a PCN reaction causing immediate rash, facial/tongue/throat swelling, SOB or lightheadedness with hypotension: Yes Has patient had a PCN reaction causing severe rash involving mucus membranes or skin necrosis: No Has patient had a PCN reaction that required hospitalization No Has patient had a PCN reaction occurring within the last 10 years: No If all of the above answers are "NO", then may proceed with Cephalosporin use.    Patient Measurements: Height: 6\' 2"  (188 cm) Weight: 230 lb (104.3 kg) IBW/kg (Calculated) : 82.2 Heparin Dosing Weight: 100 kg  Vital Signs: Temp: 97.7 F (36.5 C) (05/22 0158) Temp Source: Oral (05/22 0158) BP: 161/81 (05/22 0330) Pulse Rate: 54 (05/22 0330)  Labs: Recent Labs    05/29/17 0206  HGB 14.9  HCT 44.4  PLT 196  CREATININE 1.43*    Estimated Creatinine Clearance: 69.8 mL/min (A) (by C-G formula based on SCr of 1.43 mg/dL (H)).   Medical History: Past Medical History:  Diagnosis Date  . Coronary artery disease   . GERD (gastroesophageal reflux disease)   . High cholesterol   . History of kidney stones    "passed them" (01/17/2016)  . Hypertension   . PONV (postoperative nausea and vomiting)    "when I woke up from my hernia I was nauseated"  . STEMI (ST elevation myocardial infarction) (HCC) 01/12/2016    Medications:  No current facility-administered medications on file prior to encounter.    Current Outpatient Medications on File Prior to Encounter  Medication Sig Dispense Refill  . aspirin EC 81 MG tablet Take 81 mg by mouth daily.    Marland Kitchen atorvastatin (LIPITOR) 80 MG tablet Take 1 tablet (80 mg total) by mouth daily at 6 PM. (Patient taking differently: Take 80 mg by mouth at bedtime. ) 90 tablet 3  . lisinopril  (PRINIVIL,ZESTRIL) 5 MG tablet Take 5 mg by mouth daily.    . metoprolol succinate (TOPROL-XL) 100 MG 24 hr tablet Take 100 mg by mouth daily. Take with or immediately following a meal.    . nitroGLYCERIN (NITROSTAT) 0.4 MG SL tablet Place 1 tablet (0.4 mg total) under the tongue every 5 (five) minutes as needed for chest pain. 25 tablet 2  . omeprazole (PRILOSEC) 20 MG capsule Take 20 mg by mouth daily.    Marland Kitchen spironolactone (ALDACTONE) 25 MG tablet Take 25 mg by mouth daily.    Marland Kitchen albuterol (PROVENTIL HFA) 108 (90 Base) MCG/ACT inhaler Inhale 2 puffs into the lungs every 4 (four) hours as needed for wheezing or shortness of breath. (Patient not taking: Reported on 09/27/2016) 1 Inhaler 0  . isosorbide mononitrate (IMDUR) 30 MG 24 hr tablet Take 1 tablet (30 mg total) by mouth daily. (Patient not taking: Reported on 05/29/2017) 90 tablet 3  . lisinopril (PRINIVIL,ZESTRIL) 2.5 MG tablet Take 1 tablet (2.5 mg total) by mouth daily. (Patient not taking: Reported on 05/29/2017) 90 tablet 3  . metoprolol tartrate (LOPRESSOR) 25 MG tablet Take 0.5 tablets (12.5 mg total) by mouth 2 (two) times daily. (Patient not taking: Reported on 05/29/2017) 90 tablet 3  . predniSONE (DELTASONE) 20 MG tablet Take 2 tablets (40 mg total) by mouth daily. (Patient not taking: Reported on 09/27/2016) 8 tablet 0  . ticagrelor (BRILINTA) 90 MG TABS tablet Take 1 tablet (90 mg total) by mouth 2 (two)  times daily. (Patient not taking: Reported on 05/29/2017) 180 tablet 3  . [DISCONTINUED] amiodarone (PACERONE) 200 MG tablet Take by mouth.    . [DISCONTINUED] apixaban (ELIQUIS) 5 MG TABS tablet Take by mouth.    . [DISCONTINUED] ibuprofen (ADVIL,MOTRIN) 200 MG tablet Take 400 mg by mouth every 6 (six) hours as needed (pain).      Assessment: 62 y.o. male with chest pain for heparin  Goal of Therapy:  Heparin level 0.3-0.7 units/ml Monitor platelets by anticoagulation protocol: Yes   Plan:  Heparin 4000 units IV bolus, then start  heparin 1250 units/hr  Keeyon Privitera, Gary Fleet 05/29/2017,4:10 AM

## 2017-05-29 NOTE — ED Notes (Signed)
Telephone conversation with Crockett PA. Pt to be discharged. Instructions and work note to be provided.

## 2017-05-29 NOTE — ED Provider Notes (Signed)
[facility] EMERGENCY DEPARTMENT Provider Note   CSN: [id number] Arrival date & time: 05/29/17  0147     History   Chief Complaint Chief Complaint  Patient presents with  . Chest Pain  . diaphoresis    HPI Shawn Elliott is a 62 y.o. male.  62 yo M with a chief complaint of chest pain.  This started while he was watching videos on YouTube at about 1230.  He felt a sudden severe pain to the center of his chest associated with diaphoresis nausea.  He said he almost vomited and called 911.  He took 2 baby aspirins at that time took a home nitroglycerin, and was evaluated by paramedics.  His blood pressure was elevated above his normal.  Prior to this he has been doing well.  Denies cough congestion or fever has some edema mildly worse than baseline.  He has a history of 6 stents, four-way bypass surgery.  He feels that this pain is somewhat different than his prior.  The history is provided by the patient.  Chest Pain   This is a new problem. The current episode started 3 to 5 hours ago. The problem occurs constantly. The problem has not changed since onset.The pain is associated with rest. The pain is present in the substernal region. The pain is at a severity of 10/10. The pain is severe. The quality of the pain is described as heavy. The pain does not radiate. Duration of episode(s) is 4 hours. Associated symptoms include diaphoresis, nausea and shortness of breath. Pertinent negatives include no abdominal pain, no fever, no headaches, no palpitations and no vomiting. He has tried nothing for the symptoms. The treatment provided no relief.  His past medical history is significant for MI.    Past Medical History:  Diagnosis Date  . Coronary artery disease   . GERD (gastroesophageal reflux disease)   . High cholesterol   . History of kidney stones    "passed them" (01/17/2016)  . Hypertension   . PONV (postoperative nausea and vomiting)    "when I woke up from my  hernia I was nauseated"  . STEMI (ST elevation myocardial infarction) (HCC) 01/12/2016    Patient Active Problem List   Diagnosis Date Noted  . Chest pain 05/29/2017  . 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 Atrial fibrillation (HCC) 03/16/2016  . Status post coronary artery stent placement   . Obesity (BMI 30-39.9)   . Hyperlipidemia LDL goal <70   . Elevated liver function tests   . Acute ST elevation myocardial infarction (STEMI) involving right coronary artery (HCC) 01/12/2016  . Coronary artery disease involving native coronary artery of native heart with unstable angina pectoris (HCC) 01/12/2016  . Atrial fibrillation with rapid ventricular response (HCC) 01/12/2016  . 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 Acute ST elevation myocardial infarction (STEMI) due to occlusion of right coronary artery (HCC)   . Coronary artery disease involving native coronary artery with unstable angina pectoris [facility])     Past Surgical History:  Procedure Laterality Date  . CARDIAC CATHETERIZATION N/A 01/12/2016   Procedure: Left Heart Cath and Coronary Angiography;  Surgeon: Lennette Bihari, MD;  Location: [facility] INVASIVE CV LAB;  Service: Cardiovascular;  Laterality: N/A;  . CARDIAC CATHETERIZATION N/A 01/12/2016   Procedure: Coronary Stent Intervention;  Surgeon: Lennette Bihari, MD;  Location: [facility] INVASIVE CV LAB;  Service: Cardiovascular;  Laterality: N/A;  . CARDIAC CATHETERIZATION N/A 01/17/2016   Procedure: Coronary Stent Intervention;  Surgeon: Lennette Bihari, MD;  Location: [facility] INVASIVE CV LAB;  Service: Cardiovascular;  Laterality: N/A;  . CARDIAC CATHETERIZATION N/A 01/17/2016   Procedure: Coronary Balloon Angioplasty;  Surgeon: Lennette Bihari, MD;  Location: [facility] INVASIVE CV LAB;  Service: Cardiovascular;  Laterality: N/A;  . COLONOSCOPY  ~ 2008  . CORONARY ANGIOPLASTY WITH STENT PLACEMENT  01/17/2016   "3 01/12/2016; 3 01/17/2016"  . GANGLION CYST EXCISION Right ~ 2016  . INGUINAL HERNIA REPAIR Right ~ 2014  .  kidney stones  2010        Home Medications    Prior to Admission medications   Medication Sig Start Date End Date Taking? Authorizing Provider  aspirin EC 81 MG tablet Take 81 mg by mouth daily.   Yes [provider]  atorvastatin (LIPITOR) 80 MG tablet Take 1 tablet (80 mg total) by mouth daily at 6 PM. Patient taking differently: Take 80 mg by mouth at bedtime.  02/06/16  Yes Almond Lint, MD  lisinopril (PRINIVIL,ZESTRIL) 5 MG tablet Take 5 mg by mouth daily.   Yes [provider]  metoprolol succinate (TOPROL-XL) 100 MG 24 hr tablet Take 100 mg by mouth daily. Take with or immediately following a meal.   Yes [provider]  nitroGLYCERIN (NITROSTAT) 0.4 MG SL tablet Place 1 tablet (0.4 mg total) under the tongue every 5 (five) minutes as needed for chest pain. 02/02/16  Yes Almond Lint, MD  omeprazole (PRILOSEC) 20 MG capsule Take 20 mg by mouth daily. 12/04/15  Yes [provider]  spironolactone (ALDACTONE) 25 MG tablet Take 25 mg by mouth daily.   Yes [provider]  albuterol (PROVENTIL HFA) 108 (90 Base) MCG/ACT inhaler Inhale 2 puffs into the lungs every 4 (four) hours as needed for wheezing or shortness of breath. Patient not taking: Reported on 09/27/2016 06/27/16   Sharman Cheek, MD  isosorbide mononitrate (IMDUR) 30 MG 24 hr tablet Take 1 tablet (30 mg total) by mouth daily. Patient not taking: Reported on 05/29/2017 02/03/16   Almond Lint, MD  lisinopril (PRINIVIL,ZESTRIL) 2.5 MG tablet Take 1 tablet (2.5 mg total) by mouth daily. Patient not taking: Reported on 05/29/2017 02/02/16   Almond Lint, MD  metoprolol tartrate (LOPRESSOR) 25 MG tablet Take 0.5 tablets (12.5 mg total) by mouth 2 (two) times daily. Patient not taking: Reported on 05/29/2017 02/02/16   Almond Lint, MD  predniSONE (DELTASONE) 20 MG tablet Take 2 tablets (40 mg total) by mouth daily. Patient not taking: Reported on 09/27/2016 06/27/16   Sharman Cheek,  MD  ticagrelor (BRILINTA) 90 MG TABS tablet Take 1 tablet (90 mg total) by mouth 2 (two) times daily. Patient not taking: Reported on 05/29/2017 02/02/16   Almond Lint, MD    Family History Family History  Problem Relation Age of Onset  . Heart attack Mother   . Diabetes Mother   . Stroke Mother   . Hypertension Mother   . Hyperlipidemia Mother   . Diabetes Father   . Stroke Father   . Hyperlipidemia Father   . Hypertension Father   . Hyperlipidemia Sister   . Hypertension Sister   . Hypertension Brother   . Hyperlipidemia Brother   . Diabetes Brother     Social History Social History   Tobacco Use  . Smoking status: Former Smoker    Types: Pipe    Last attempt to quit: 1982    Years since quitting: 37.4  . Smokeless tobacco: Never Used  Substance Use  Topics  . Alcohol use: Yes    Alcohol/week: 0.6 oz    Types: 1 Cans of beer per week  . Drug use: No     Allergies   Penicillins   Review of Systems Review of Systems  Constitutional: Positive for diaphoresis. Negative for chills and fever.  HENT: Negative for congestion and facial swelling.   Eyes: Negative for discharge and visual disturbance.  Respiratory: Positive for shortness of breath.   Cardiovascular: Positive for chest pain. Negative for palpitations.  Gastrointestinal: Positive for nausea. Negative for abdominal pain, diarrhea and vomiting.  Musculoskeletal: Negative for arthralgias and myalgias.  Skin: Negative for color change and rash.  Neurological: Negative for tremors, syncope and headaches.  Psychiatric/Behavioral: Negative for confusion and dysphoric mood.     Physical Exam Updated Vital Signs BP 117/63   Pulse 65   Temp 97.7 F (36.5 C) (Oral)   Resp 20   Ht 6\' 2"  (1.88 m)   Wt 104.3 kg (230 lb)   SpO2 96%   BMI 29.53 kg/m   Physical Exam  Constitutional: He is oriented to person, place, and time. He appears well-developed and well-nourished.  HENT:  Head: Normocephalic and  atraumatic.  Eyes: Pupils are equal, round, and reactive to light. EOM are normal.  Neck: Normal range of motion. Neck supple. No JVD present.  Cardiovascular: Normal rate and regular rhythm. Exam reveals no gallop and no friction rub.  No murmur heard. Pulmonary/Chest: No respiratory distress. He has no wheezes.  Abdominal: He exhibits no distension and no mass. There is tenderness (mild epigastric, negative murphys, no RUQ pain). There is no rebound and no guarding.  Musculoskeletal: Normal range of motion.  Neurological: He is alert and oriented to person, place, and time.  Skin: No rash noted. No pallor.  Psychiatric: He has a normal mood and affect. His behavior is normal.  Nursing note and vitals reviewed.    ED Treatments / Results  Labs (all labs ordered are listed, but only abnormal results are displayed) Labs Reviewed  BASIC METABOLIC PANEL - Abnormal; Notable for the following components:      Result Value   Glucose, Bld 112 (*)    BUN 27 (*)    Creatinine, Ser 1.43 (*)    GFR calc non Af Amer 51 (*)    GFR calc Af Amer 60 (*)    All other components within normal limits  HEPATIC FUNCTION PANEL - Abnormal; Notable for the following components:   AST 75 (*)    All other components within normal limits  CBC  LIPASE, BLOOD  HEPARIN LEVEL (UNFRACTIONATED)  TROPONIN I  HIV ANTIBODY (ROUTINE TESTING)  I-STAT TROPONIN, ED    EKG EKG Interpretation  Date/Time:  Wednesday May 29 2017 03:54:47 EDT Ventricular Rate:  56 PR Interval:  154 QRS Duration: 124 QT Interval:  460 QTC Calculation: 444 R Axis:   67 Text Interpretation:  Sinus rhythm Nonspecific intraventricular conduction delay Inferior infarct, old Abnormal lateral Q waves Minimal ST elevation, anterior leads No significant change since last tracing Confirmed by Melene Plan [phone]) on 05/29/2017 4:37:31 AM   Radiology Dg Chest 2 View  Result Date: 05/29/2017 CLINICAL DATA:  Chest pain and shortness of  breath. EXAM: CHEST - 2 VIEW COMPARISON:  06/27/2016 FINDINGS: Post median sternotomy and CABG. Heart size upper normal with normal mediastinal contours. Pulmonary vasculature is normal. No consolidation, pleural effusion, or pneumothorax. No acute osseous abnormalities are seen. Incidental note of gaseous gastric distention  in the upper abdomen. IMPRESSION: Post CABG without congestive failure or acute pulmonary process. Electronically Signed   By: Rubye Oaks M.D.   On: 05/29/2017 02:54    Procedures Procedures (including critical care time)  Medications Ordered in ED Medications  nitroGLYCERIN 50 mg in dextrose 5 % 250 mL (0.2 mg/mL) infusion (0 mcg/min Intravenous Not Given 05/29/17 0457)  heparin ADULT infusion 100 units/mL (25000 units/264mL sodium chloride 0.45%) (1,250 Units/hr Intravenous New Bag/Given 05/29/17 0502)  lisinopril (PRINIVIL,ZESTRIL) tablet 5 mg (has no administration in time range)  spironolactone (ALDACTONE) tablet 25 mg (has no administration in time range)  aspirin EC tablet 81 mg (has no administration in time range)  atorvastatin (LIPITOR) tablet 80 mg (has no administration in time range)  aspirin EC tablet 81 mg (has no administration in time range)  nitroGLYCERIN (NITROSTAT) SL tablet 0.4 mg (has no administration in time range)  acetaminophen (TYLENOL) tablet 650 mg (has no administration in time range)  ondansetron (ZOFRAN) injection 4 mg (has no administration in time range)  gi cocktail (Maalox,Lidocaine,Donnatal) (30 mLs Oral Given 05/29/17 0414)  heparin bolus via infusion 4,000 Units (4,000 Units Intravenous Bolus from Bag 05/29/17 0501)  morphine 4 MG/ML injection 4 mg (4 mg Intravenous Given 05/29/17 0523)  ondansetron (ZOFRAN) injection 4 mg (4 mg Intravenous Given 05/29/17 0523)  aspirin chewable tablet 324 mg (324 mg Oral Given 05/29/17 0622)    Or  aspirin suppository 300 mg ( Rectal See Alternative 05/29/17 0865)     Initial Impression /  Assessment and Plan / ED Course  I have reviewed the triage vital signs and the nursing notes.  Pertinent labs & imaging results that were available during my care of the patient were reviewed by me and considered in my medical decision making (see chart for details).     62 yo M with a chief complaint of chest pain.  Patient has some mild epigastric pain on exam.  He thinks this is somewhat atypical from his prior episodes of ACS though that was prior to bypass.  EKG with no concerning changes.  Initial troponin negative.  We will give a GI cocktail, started on heparin and nitro as the patient is having persistent symptoms.  Discussed with cardiology who will evaluate at bedside.  Seen by cardiology, recommended serial troponins and then they would reevaluate the patient in the morning.  Recommend hospitalist admission as the pain was somewhat atypical and improved with GI cocktail.  Having persistent pain.  Discussed with cards who will admit.   CRITICAL CARE Performed by: Rae Roam   Total critical care time: 35 minutes  Critical care time was exclusive of separately billable procedures and treating other patients.  Critical care was necessary to treat or prevent imminent or life-threatening deterioration.  Critical care was time spent personally by me on the following activities: development of treatment plan with patient and/or surrogate as well as nursing, discussions with consultants, evaluation of patient's response to treatment, examination of patient, obtaining history from patient or surrogate, ordering and performing treatments and interventions, ordering and review of laboratory studies, ordering and review of radiographic studies, pulse oximetry and re-evaluation of patient's condition.  The patients results and plan were reviewed and discussed.   Any x-rays performed were independently reviewed by myself.   Differential diagnosis were considered with the  presenting HPI.  Medications  nitroGLYCERIN 50 mg in dextrose 5 % 250 mL (0.2 mg/mL) infusion (0 mcg/min Intravenous Not Given 05/29/17 0457)  heparin ADULT infusion 100 units/mL (25000 units/210mL sodium chloride 0.45%) (1,250 Units/hr Intravenous New Bag/Given 05/29/17 0502)  lisinopril (PRINIVIL,ZESTRIL) tablet 5 mg (has no administration in time range)  spironolactone (ALDACTONE) tablet 25 mg (has no administration in time range)  aspirin EC tablet 81 mg (has no administration in time range)  atorvastatin (LIPITOR) tablet 80 mg (has no administration in time range)  aspirin EC tablet 81 mg (has no administration in time range)  nitroGLYCERIN (NITROSTAT) SL tablet 0.4 mg (has no administration in time range)  acetaminophen (TYLENOL) tablet 650 mg (has no administration in time range)  ondansetron (ZOFRAN) injection 4 mg (has no administration in time range)  gi cocktail (Maalox,Lidocaine,Donnatal) (30 mLs Oral Given 05/29/17 0414)  heparin bolus via infusion 4,000 Units (4,000 Units Intravenous Bolus from Bag 05/29/17 0501)  morphine 4 MG/ML injection 4 mg (4 mg Intravenous Given 05/29/17 0523)  ondansetron (ZOFRAN) injection 4 mg (4 mg Intravenous Given 05/29/17 0523)  aspirin chewable tablet 324 mg (324 mg Oral Given 05/29/17 0622)    Or  aspirin suppository 300 mg ( Rectal See Alternative 05/29/17 0622)    Vitals:   05/29/17 0445 05/29/17 0500 05/29/17 0530 05/29/17 0545  BP: (!) 156/82 (!) 142/78 131/72 117/63  Pulse: (!) 57 (!) 58 64 65  Resp: 16 17 14 20   Temp:      TempSrc:      SpO2: 100% 99% 97% 96%  Weight:      Height:        Final diagnoses:  Chest pain with high risk for cardiac etiology    Admission/ observation were discussed with the admitting physician, patient and/or family and they are comfortable with the plan.    Final Clinical Impressions(s) / ED Diagnoses   Final diagnoses:  Chest pain with high risk for cardiac etiology    ED Discharge Orders    None        Melene Plan, DO 05/29/17 [phone]

## 2017-05-29 NOTE — ED Notes (Signed)
Pt admin his own home meds. Refused hospital admnin. Lisinopril,. Spirolactone, omeprazole, metoprolol, and ASA.

## 2017-05-29 NOTE — Consult Note (Deleted)
Cardiology Consultation:   Patient ID: Shawn Elliott; [id number]; September 06, 1955   Admit date: 05/29/2017 Date of Consult: 05/29/2017  Primary Care Provider: Manfred Arch, MD Primary Cardiologist: Almond Lint, MD   Chief Complaint: Epigastric pain  Patient Profile:   Shawn Elliott is a 62 y.o. male with a hx of CAD status post PCI and most recently CABG in 08/2016, chronic systolic heart failure with EF 30%, hypertension, hyperlipidemia, GERD who presents with epigastric pain.   History of Present Illness:   Patient developed acute epigastric and right upper quadrant pain this morning at 12:30 AM while sitting in his recliner.  Also developed nausea and diaphoresis.  He notes that the epigastric pain was different than his prior angina, but the nausea and diaphoresis made him concerned for possible heart attack so he called EMS.  Eventually he drove himself to [facility] emergency room.  EKG showed sinus rhythm heart rate 56 and prior old inferior infarct without any acute ST segment changes.  Troponin drawn at 2:15 AM was 0.  Patient received a GI cocktail in the emergency room with some improvement in his symptoms.  He has otherwise felt well recently with no chest discomfort, dyspnea, or weight gain.   Past Medical History:  Diagnosis Date  . Coronary artery disease   . GERD (gastroesophageal reflux disease)   . High cholesterol   . History of kidney stones    "passed them" (01/17/2016)  . Hypertension   . PONV (postoperative nausea and vomiting)    "when I woke up from my hernia I was nauseated"  . STEMI (ST elevation myocardial infarction) (HCC) 01/12/2016    Past Surgical History:  Procedure Laterality Date  . CARDIAC CATHETERIZATION N/A 01/12/2016   Procedure: Left Heart Cath and Coronary Angiography;  Surgeon: Lennette Bihari, MD;  Location: [facility] INVASIVE CV LAB;  Service: Cardiovascular;  Laterality: N/A;  . CARDIAC CATHETERIZATION N/A 01/12/2016   Procedure:  Coronary Stent Intervention;  Surgeon: Lennette Bihari, MD;  Location: [facility] INVASIVE CV LAB;  Service: Cardiovascular;  Laterality: N/A;  . CARDIAC CATHETERIZATION N/A 01/17/2016   Procedure: Coronary Stent Intervention;  Surgeon: Lennette Bihari, MD;  Location: [facility] INVASIVE CV LAB;  Service: Cardiovascular;  Laterality: N/A;  . CARDIAC CATHETERIZATION N/A 01/17/2016   Procedure: Coronary Balloon Angioplasty;  Surgeon: Lennette Bihari, MD;  Location: [facility] INVASIVE CV LAB;  Service: Cardiovascular;  Laterality: N/A;  . COLONOSCOPY  ~ 2008  . CORONARY ANGIOPLASTY WITH STENT PLACEMENT  01/17/2016   "3 01/12/2016; 3 01/17/2016"  . GANGLION CYST EXCISION Right ~ 2016  . INGUINAL HERNIA REPAIR Right ~ 2014  . kidney stones  2010     Inpatient Medications: Scheduled Meds: . heparin  4,000 Units Intravenous Once   Continuous Infusions: . heparin    . nitroGLYCERIN     PRN Meds:   Home Meds: Prior to Admission medications   Medication Sig Start Date End Date Taking? Authorizing Provider  aspirin EC 81 MG tablet Take 81 mg by mouth daily.   Yes [provider]  atorvastatin (LIPITOR) 80 MG tablet Take 1 tablet (80 mg total) by mouth daily at 6 PM. Patient taking differently: Take 80 mg by mouth at bedtime.  02/06/16  Yes Almond Lint, MD  lisinopril (PRINIVIL,ZESTRIL) 5 MG tablet Take 5 mg by mouth daily.   Yes [provider]  metoprolol succinate (TOPROL-XL) 100 MG 24 hr tablet Take 100 mg by mouth daily. Take with or immediately  following a meal.   Yes [provider]  nitroGLYCERIN (NITROSTAT) 0.4 MG SL tablet Place 1 tablet (0.4 mg total) under the tongue every 5 (five) minutes as needed for chest pain. 02/02/16  Yes Almond Lint, MD  omeprazole (PRILOSEC) 20 MG capsule Take 20 mg by mouth daily. 12/04/15  Yes [provider]  spironolactone (ALDACTONE) 25 MG tablet Take 25 mg by mouth daily.   Yes [provider]  albuterol (PROVENTIL HFA) 108 (90 Base)  MCG/ACT inhaler Inhale 2 puffs into the lungs every 4 (four) hours as needed for wheezing or shortness of breath. Patient not taking: Reported on 09/27/2016 06/27/16   Sharman Cheek, MD  isosorbide mononitrate (IMDUR) 30 MG 24 hr tablet Take 1 tablet (30 mg total) by mouth daily. Patient not taking: Reported on 05/29/2017 02/03/16   Almond Lint, MD  lisinopril (PRINIVIL,ZESTRIL) 2.5 MG tablet Take 1 tablet (2.5 mg total) by mouth daily. Patient not taking: Reported on 05/29/2017 02/02/16   Almond Lint, MD  metoprolol tartrate (LOPRESSOR) 25 MG tablet Take 0.5 tablets (12.5 mg total) by mouth 2 (two) times daily. Patient not taking: Reported on 05/29/2017 02/02/16   Almond Lint, MD  predniSONE (DELTASONE) 20 MG tablet Take 2 tablets (40 mg total) by mouth daily. Patient not taking: Reported on 09/27/2016 06/27/16   Sharman Cheek, MD  ticagrelor (BRILINTA) 90 MG TABS tablet Take 1 tablet (90 mg total) by mouth 2 (two) times daily. Patient not taking: Reported on 05/29/2017 02/02/16   Almond Lint, MD    Allergies:    Allergies  Allergen Reactions  . Penicillins Hives    Childhood allergic reaction Has patient had a PCN reaction causing immediate rash, facial/tongue/throat swelling, SOB or lightheadedness with hypotension: Yes Has patient had a PCN reaction causing severe rash involving mucus membranes or skin necrosis: No Has patient had a PCN reaction that required hospitalization No Has patient had a PCN reaction occurring within the last 10 years: No If all of the above answers are "NO", then may proceed with Cephalosporin use.    Social History:   Social History   Socioeconomic History  . Marital status: Married    Spouse name: Not on file  . Number of children: Not on file  . Years of education: Not on file  . Highest education level: Not on file  Occupational History  . Not on file  Social Needs  . Financial resource strain: Not on file  . Food insecurity:    Worry: Not  on file    Inability: Not on file  . Transportation needs:    Medical: Not on file    Non-medical: Not on file  Tobacco Use  . Smoking status: Former Smoker    Types: Pipe    Last attempt to quit: 1982    Years since quitting: 37.4  . Smokeless tobacco: Never Used  Substance and Sexual Activity  . Alcohol use: Yes    Alcohol/week: 0.6 oz    Types: 1 Cans of beer per week  . Drug use: No  . Sexual activity: Not Currently  Lifestyle  . Physical activity:    Days per week: Not on file    Minutes per session: Not on file  . Stress: Not on file  Relationships  . Social connections:    Talks on phone: Not on file    Gets together: Not on file    Attends religious service: Not on file    Active member of club  or organization: Not on file    Attends meetings of clubs or organizations: Not on file    Relationship status: Not on file  . Intimate partner violence:    Fear of current or ex partner: Not on file    Emotionally abused: Not on file    Physically abused: Not on file    Forced sexual activity: Not on file  Other Topics Concern  . Not on file  Social History Narrative  . Not on file    Family History:   The patient's family history includes Diabetes in his brother, father, and mother; Heart attack in his mother; Hyperlipidemia in his brother, father, mother, and sister; Hypertension in his brother, father, mother, and sister; Stroke in his father and mother.  ROS:  Please see the history of present illness.  All other ROS reviewed and negative.     Physical Exam/Data:   Vitals:   05/29/17 0203 05/29/17 0300 05/29/17 0315 05/29/17 0330  BP:  139/73 (!) 151/81 (!) 161/81  Pulse:  (!) 54 (!) 53 (!) 54  Resp:  17 17 15   Temp:      TempSrc:      SpO2:  99% 98% 100%  Weight: 104.3 kg (230 lb)     Height: 6\' 2"  (1.88 m)      No intake or output data in the 24 hours ending 05/29/17 0437 Filed Weights   05/29/17 0203  Weight: 104.3 kg (230 lb)   Body mass index  is 29.53 kg/m.  General: Well developed, well nourished, in no acute distress. Head: Normocephalic, atraumatic, sclera non-icteric, no xanthomas, nares are without discharge.  Neck: Negative for carotid bruits. JVD not elevated. Lungs: Clear bilaterally to auscultation without wheezes, rales, or rhonchi. Breathing is unlabored. Heart: RRR with S1 S2. No murmurs, rubs, or gallops appreciated. Abdomen: Soft, non-tender, non-distended with normoactive bowel sounds. No hepatomegaly. No rebound/guarding. No obvious abdominal masses. Msk:  Strength and tone appear normal for age. Extremities: No clubbing or cyanosis. 1+ edema.  Distal pedal pulses are 2+ and equal bilaterally. Neuro: Alert and oriented X 3. No facial asymmetry. No focal deficit. Moves all extremities spontaneously. Psych:  Responds to questions appropriately with a normal affect.  EKG:  The EKG showed sinus rhythm heart rate 56 and prior old inferior infarct without any acute ST segment changes.   Relevant CV Studies: Echo 04/17/2017 [facility].  EF 30%, mild MR.  LVIDD 6 cm.  Laboratory Data:  Chemistry Recent Labs  Lab 05/29/17 0206  NA 141  K 3.9  CL 107  CO2 22  GLUCOSE 112*  BUN 27*  CREATININE 1.43*  CALCIUM 9.6  GFRNONAA 51*  GFRAA 60*  ANIONGAP 12    No results for input(s): PROT, ALBUMIN, AST, ALT, ALKPHOS, BILITOT in the last 168 hours. Hematology Recent Labs  Lab 05/29/17 0206  WBC 10.0  RBC 4.84  HGB 14.9  HCT 44.4  MCV 91.7  MCH 30.8  MCHC 33.6  RDW 12.4  PLT 196   Cardiac EnzymesNo results for input(s): TROPONINI in the last 168 hours.  Recent Labs  Lab 05/29/17 0216  TROPIPOC 0.00    BNPNo results for input(s): BNP, PROBNP in the last 168 hours.  DDimer No results for input(s): DDIMER in the last 168 hours.  Radiology/Studies:  Dg Chest 2 View  Result Date: 05/29/2017 CLINICAL DATA:  Chest pain and shortness of breath. EXAM: CHEST - 2 VIEW COMPARISON:  06/27/2016  FINDINGS: Post median  sternotomy and CABG. Heart size upper normal with normal mediastinal contours. Pulmonary vasculature is normal. No consolidation, pleural effusion, or pneumothorax. No acute osseous abnormalities are seen. Incidental note of gaseous gastric distention in the upper abdomen. IMPRESSION: Post CABG without congestive failure or acute pulmonary process. Electronically Signed   By: Rubye Oaks M.D.   On: 05/29/2017 02:54    Assessment and Plan:   1. Epigastric/chest pain Location of his pain is atypical and he points more towards his gastrum and right upper quadrant.  EKG and troponin are completely normal.  His symptoms have improved some with a GI cocktail.  Suspect this may be GI in etiology.  Would plan to observe him and get 2 more sets of troponin.  If these are negative he can likely be discharged if his symptoms have resolved.  If he has recurrent symptoms can consider a stress test.  --Would get 2 more sets of troponin --Would not start heparin or IV nitroglycerin for now  2. Hypertension Will hold home metoprolol in event stress test is needed.  3. CAD Continue aspirin, atorvastatin, lisinopril  Cardiology will continue to follow.  For questions or updates, please contact [facility] Please consult [email or web address] for contact info under Cardiology/STEMI.    Allen Derry, MD  05/29/2017 4:37 AM
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
## 2017-05-29 NOTE — ED Notes (Signed)
Cardiology at bedside.

## 2018-10-14 ENCOUNTER — Telehealth: Payer: Self-pay | Admitting: Cardiology

## 2018-10-14 NOTE — Telephone Encounter (Signed)
Received forms from Disability Determination Services to be completed Placed in interoffice mail and sent to [employer]  

## 2019-04-04 ENCOUNTER — Ambulatory Visit: Payer: Managed Care, Other (non HMO) | Attending: Internal Medicine

## 2019-04-04 DIAGNOSIS — Z23 Encounter for immunization: Secondary | ICD-10-CM

## 2019-04-04 NOTE — Progress Notes (Signed)
   Covid-19 Vaccination Clinic  Name:  Shawn Elliott    MRN: [id number] DOB: [birth year]  04/04/2019  [name] was observed post Covid-19 immunization for 15 minutes without incident. He was provided with Vaccine Information Sheet and instruction to access the V-Safe system.   [name] was instructed to call 911 with any severe reactions post vaccine: Marland Kitchen Difficulty breathing  . Swelling of face and throat  . A fast heartbeat  . A bad rash all over body  . Dizziness and weakness   Immunizations Administered    Name Date Dose VIS Date Route   Pfizer COVID-19 Vaccine 04/04/2019  3:50 PM 0.3 mL 12/19/2018 Intramuscular   Manufacturer: [employer], [employer]   Lot: [id number]   NDC: [address]-5

## 2019-04-14 ENCOUNTER — Other Ambulatory Visit: Payer: Self-pay | Admitting: Orthopaedic Surgery

## 2019-04-14 DIAGNOSIS — M25512 Pain in left shoulder: Secondary | ICD-10-CM

## 2019-04-23 ENCOUNTER — Telehealth: Payer: Self-pay | Admitting: Cardiology

## 2019-04-23 NOTE — Telephone Encounter (Signed)
Received medical record request from [facility] for disability claim Placed in interoffice mail and sent to [facility]

## 2019-04-28 ENCOUNTER — Ambulatory Visit: Payer: Managed Care, Other (non HMO) | Attending: Internal Medicine

## 2019-04-28 DIAGNOSIS — Z23 Encounter for immunization: Secondary | ICD-10-CM

## 2019-04-28 NOTE — Progress Notes (Signed)
   Covid-19 Vaccination Clinic  Name:  TAELOR MONCADA    MRN: [id number] DOB: [birth year]  04/28/2019  Mr. Sease was observed post Covid-19 immunization for 30 minutes based on pre-vaccination screening without incident. He was provided with Vaccine Information Sheet and instruction to access the V-Safe system.   Mr. Mcfarren was instructed to call 911 with any severe reactions post vaccine: Marland Kitchen Difficulty breathing  . Swelling of face and throat  . A fast heartbeat  . A bad rash all over body  . Dizziness and weakness   Immunizations Administered    Name Date Dose VIS Date Route   Pfizer COVID-19 Vaccine 04/28/2019 11:25 AM 0.3 mL 03/04/2018 Intramuscular   Manufacturer: [employer], [employer]   Lot: [id number]   NDC: [address]-7

## 2019-05-05 ENCOUNTER — Ambulatory Visit
Admission: RE | Admit: 2019-05-05 | Discharge: 2019-05-05 | Disposition: A | Discharge status: Home / Self Care | Payer: Managed Care, Other (non HMO) | Source: Ambulatory Visit | Attending: Orthopaedic Surgery | Admitting: Orthopaedic Surgery

## 2019-05-05 ENCOUNTER — Other Ambulatory Visit: Payer: Self-pay

## 2019-05-05 DIAGNOSIS — M25512 Pain in left shoulder: Secondary | ICD-10-CM

## 2019-05-05 MED ORDER — IOPAMIDOL (ISOVUE-M 200) INJECTION 41%
12.0000 mL | Freq: Once | INTRAMUSCULAR | Status: AC
  Administered 2019-05-05: 15:00:00 12 mL via INTRA_ARTICULAR

## 2019-11-28 ENCOUNTER — Emergency Department
Admission: EM | Admit: 2019-11-28 | Discharge: 2019-11-28 | Disposition: A | Discharge status: Other Acute Inpatient Hospital | Payer: Managed Care, Other (non HMO) | Attending: Emergency Medicine | Admitting: Emergency Medicine

## 2019-11-28 ENCOUNTER — Other Ambulatory Visit: Payer: Self-pay

## 2019-11-28 ENCOUNTER — Emergency Department: Payer: Managed Care, Other (non HMO)

## 2019-11-28 ENCOUNTER — Inpatient Hospital Stay (HOSPITAL_COMMUNITY)
Admission: AD | Admit: 2019-11-28 | Discharge: 2019-12-01 | DRG: 854 | Disposition: A | Discharge status: Home / Self Care | Payer: Managed Care, Other (non HMO) | Source: Other Acute Inpatient Hospital | Attending: Internal Medicine | Admitting: Internal Medicine

## 2019-11-28 DIAGNOSIS — Z823 Family history of stroke: Secondary | ICD-10-CM

## 2019-11-28 DIAGNOSIS — E785 Hyperlipidemia, unspecified: Secondary | ICD-10-CM | POA: Diagnosis present

## 2019-11-28 DIAGNOSIS — K8062 Calculus of gallbladder and bile duct with acute cholecystitis without obstruction: Secondary | ICD-10-CM | POA: Diagnosis present

## 2019-11-28 DIAGNOSIS — Z87891 Personal history of nicotine dependence: Secondary | ICD-10-CM

## 2019-11-28 DIAGNOSIS — R1011 Right upper quadrant pain: Secondary | ICD-10-CM | POA: Diagnosis present

## 2019-11-28 DIAGNOSIS — I4891 Unspecified atrial fibrillation: Secondary | ICD-10-CM | POA: Diagnosis not present

## 2019-11-28 DIAGNOSIS — Z95810 Presence of automatic (implantable) cardiac defibrillator: Secondary | ICD-10-CM | POA: Diagnosis not present

## 2019-11-28 DIAGNOSIS — I480 Paroxysmal atrial fibrillation: Secondary | ICD-10-CM | POA: Diagnosis not present

## 2019-11-28 DIAGNOSIS — I5042 Chronic combined systolic (congestive) and diastolic (congestive) heart failure: Secondary | ICD-10-CM | POA: Diagnosis present

## 2019-11-28 DIAGNOSIS — Z8249 Family history of ischemic heart disease and other diseases of the circulatory system: Secondary | ICD-10-CM | POA: Diagnosis not present

## 2019-11-28 DIAGNOSIS — Z87442 Personal history of urinary calculi: Secondary | ICD-10-CM

## 2019-11-28 DIAGNOSIS — R6520 Severe sepsis without septic shock: Secondary | ICD-10-CM | POA: Diagnosis present

## 2019-11-28 DIAGNOSIS — I119 Hypertensive heart disease without heart failure: Secondary | ICD-10-CM | POA: Diagnosis not present

## 2019-11-28 DIAGNOSIS — E7800 Pure hypercholesterolemia, unspecified: Secondary | ICD-10-CM | POA: Diagnosis present

## 2019-11-28 DIAGNOSIS — Z833 Family history of diabetes mellitus: Secondary | ICD-10-CM

## 2019-11-28 DIAGNOSIS — Z7982 Long term (current) use of aspirin: Secondary | ICD-10-CM | POA: Insufficient documentation

## 2019-11-28 DIAGNOSIS — D72829 Elevated white blood cell count, unspecified: Secondary | ICD-10-CM | POA: Insufficient documentation

## 2019-11-28 DIAGNOSIS — K760 Fatty (change of) liver, not elsewhere classified: Secondary | ICD-10-CM | POA: Diagnosis present

## 2019-11-28 DIAGNOSIS — I252 Old myocardial infarction: Secondary | ICD-10-CM | POA: Diagnosis not present

## 2019-11-28 DIAGNOSIS — Z20822 Contact with and (suspected) exposure to covid-19: Secondary | ICD-10-CM | POA: Diagnosis not present

## 2019-11-28 DIAGNOSIS — I248 Other forms of acute ischemic heart disease: Secondary | ICD-10-CM | POA: Diagnosis present

## 2019-11-28 DIAGNOSIS — Z6831 Body mass index (BMI) 31.0-31.9, adult: Secondary | ICD-10-CM | POA: Diagnosis not present

## 2019-11-28 DIAGNOSIS — R17 Unspecified jaundice: Secondary | ICD-10-CM

## 2019-11-28 DIAGNOSIS — R079 Chest pain, unspecified: Secondary | ICD-10-CM

## 2019-11-28 DIAGNOSIS — R7881 Bacteremia: Secondary | ICD-10-CM

## 2019-11-28 DIAGNOSIS — Z79899 Other long term (current) drug therapy: Secondary | ICD-10-CM | POA: Insufficient documentation

## 2019-11-28 DIAGNOSIS — Z83438 Family history of other disorder of lipoprotein metabolism and other lipidemia: Secondary | ICD-10-CM

## 2019-11-28 DIAGNOSIS — I110 Hypertensive heart disease with heart failure: Secondary | ICD-10-CM | POA: Diagnosis present

## 2019-11-28 DIAGNOSIS — B961 Klebsiella pneumoniae [K. pneumoniae] as the cause of diseases classified elsewhere: Secondary | ICD-10-CM

## 2019-11-28 DIAGNOSIS — K219 Gastro-esophageal reflux disease without esophagitis: Secondary | ICD-10-CM | POA: Diagnosis not present

## 2019-11-28 DIAGNOSIS — R1010 Upper abdominal pain, unspecified: Secondary | ICD-10-CM | POA: Diagnosis present

## 2019-11-28 DIAGNOSIS — E669 Obesity, unspecified: Secondary | ICD-10-CM | POA: Diagnosis present

## 2019-11-28 DIAGNOSIS — I340 Nonrheumatic mitral (valve) insufficiency: Secondary | ICD-10-CM | POA: Diagnosis not present

## 2019-11-28 DIAGNOSIS — A4159 Other Gram-negative sepsis: Secondary | ICD-10-CM | POA: Diagnosis present

## 2019-11-28 DIAGNOSIS — I088 Other rheumatic multiple valve diseases: Secondary | ICD-10-CM | POA: Diagnosis present

## 2019-11-28 DIAGNOSIS — R7402 Elevation of levels of lactic acid dehydrogenase (LDH): Secondary | ICD-10-CM | POA: Insufficient documentation

## 2019-11-28 DIAGNOSIS — R748 Abnormal levels of other serum enzymes: Secondary | ICD-10-CM | POA: Diagnosis not present

## 2019-11-28 DIAGNOSIS — I2510 Atherosclerotic heart disease of native coronary artery without angina pectoris: Secondary | ICD-10-CM | POA: Insufficient documentation

## 2019-11-28 DIAGNOSIS — R072 Precordial pain: Secondary | ICD-10-CM

## 2019-11-28 DIAGNOSIS — I25810 Atherosclerosis of coronary artery bypass graft(s) without angina pectoris: Secondary | ICD-10-CM | POA: Insufficient documentation

## 2019-11-28 DIAGNOSIS — R0789 Other chest pain: Secondary | ICD-10-CM | POA: Insufficient documentation

## 2019-11-28 DIAGNOSIS — Z951 Presence of aortocoronary bypass graft: Secondary | ICD-10-CM | POA: Diagnosis not present

## 2019-11-28 DIAGNOSIS — R7401 Elevation of levels of liver transaminase levels: Secondary | ICD-10-CM

## 2019-11-28 DIAGNOSIS — A419 Sepsis, unspecified organism: Principal | ICD-10-CM

## 2019-11-28 DIAGNOSIS — I255 Ischemic cardiomyopathy: Secondary | ICD-10-CM | POA: Diagnosis present

## 2019-11-28 DIAGNOSIS — K8001 Calculus of gallbladder with acute cholecystitis with obstruction: Secondary | ICD-10-CM | POA: Diagnosis not present

## 2019-11-28 DIAGNOSIS — Z880 Allergy status to penicillin: Secondary | ICD-10-CM | POA: Diagnosis not present

## 2019-11-28 DIAGNOSIS — I493 Ventricular premature depolarization: Secondary | ICD-10-CM | POA: Diagnosis present

## 2019-11-28 DIAGNOSIS — Z955 Presence of coronary angioplasty implant and graft: Secondary | ICD-10-CM

## 2019-11-28 DIAGNOSIS — K8020 Calculus of gallbladder without cholecystitis without obstruction: Secondary | ICD-10-CM | POA: Diagnosis present

## 2019-11-28 DIAGNOSIS — I25110 Atherosclerotic heart disease of native coronary artery with unstable angina pectoris: Secondary | ICD-10-CM | POA: Diagnosis present

## 2019-11-28 DIAGNOSIS — I5022 Chronic systolic (congestive) heart failure: Secondary | ICD-10-CM | POA: Diagnosis not present

## 2019-11-28 LAB — CBC WITH DIFFERENTIAL/PLATELET
Abs Immature Granulocytes: 0.14 10*3/uL — ABNORMAL HIGH (ref 0.00–0.07)
Basophils Absolute: 0 10*3/uL (ref 0.0–0.1)
Basophils Relative: 0 %
Eosinophils Absolute: 0 10*3/uL (ref 0.0–0.5)
Eosinophils Relative: 0 %
HCT: 46.6 % (ref 39.0–52.0)
Hemoglobin: 15.7 g/dL (ref 13.0–17.0)
Immature Granulocytes: 1 %
Lymphocytes Relative: 3 %
Lymphs Abs: 0.6 10*3/uL — ABNORMAL LOW (ref 0.7–4.0)
MCH: 31.2 pg (ref 26.0–34.0)
MCHC: 33.7 g/dL (ref 30.0–36.0)
MCV: 92.6 fL (ref 80.0–100.0)
Monocytes Absolute: 0.9 10*3/uL (ref 0.1–1.0)
Monocytes Relative: 4 %
Neutro Abs: 19.4 10*3/uL — ABNORMAL HIGH (ref 1.7–7.7)
Neutrophils Relative %: 92 %
Platelets: 232 10*3/uL (ref 150–400)
RBC: 5.03 MIL/uL (ref 4.22–5.81)
RDW: 12.7 % (ref 11.5–15.5)
WBC: 21.2 10*3/uL — ABNORMAL HIGH (ref 4.0–10.5)
nRBC: 0 % (ref 0.0–0.2)

## 2019-11-28 LAB — COMPREHENSIVE METABOLIC PANEL
ALT: 419 U/L — ABNORMAL HIGH (ref 0–44)
AST: 404 U/L — ABNORMAL HIGH (ref 15–41)
Albumin: 4.2 g/dL (ref 3.5–5.0)
Alkaline Phosphatase: 155 U/L — ABNORMAL HIGH (ref 38–126)
Anion gap: 10 (ref 5–15)
BUN: 27 mg/dL — ABNORMAL HIGH (ref 8–23)
CO2: 22 mmol/L (ref 22–32)
Calcium: 8.5 mg/dL — ABNORMAL LOW (ref 8.9–10.3)
Chloride: 108 mmol/L (ref 98–111)
Creatinine, Ser: 1.11 mg/dL (ref 0.61–1.24)
GFR, Estimated: >60 mL/min (ref >60)
Glucose, Bld: 168 mg/dL — ABNORMAL HIGH (ref 70–99)
Potassium: 3.5 mmol/L (ref 3.5–5.1)
Sodium: 140 mmol/L (ref 135–145)
Total Bilirubin: 2.7 mg/dL — ABNORMAL HIGH (ref 0.3–1.2)
Total Protein: 7.1 g/dL (ref 6.5–8.1)

## 2019-11-28 LAB — RESP PANEL BY RT-PCR (FLU A&B, COVID) ARPGX2
Influenza A by PCR: NEGATIVE
Influenza B by PCR: NEGATIVE
SARS Coronavirus 2 by RT PCR: NEGATIVE

## 2019-11-28 LAB — LACTIC ACID, PLASMA
Lactic Acid, Venous: 3.3 mmol/L (ref 0.5–1.9)
Lactic Acid, Venous: 2.5 mmol/L (ref 0.5–1.9)

## 2019-11-28 LAB — URINALYSIS, COMPLETE (UACMP) WITH MICROSCOPIC
Bacteria, UA: NONE SEEN
Bilirubin Urine: NEGATIVE
Glucose, UA: 50 mg/dL — AB
Hgb urine dipstick: NEGATIVE
Ketones, ur: NEGATIVE mg/dL
Leukocytes,Ua: NEGATIVE
Nitrite: NEGATIVE
Protein, ur: 30 mg/dL — AB
Specific Gravity, Urine: 1.027 (ref 1.005–1.030)
pH: 5 (ref 5.0–8.0)

## 2019-11-28 LAB — TROPONIN I (HIGH SENSITIVITY)
Troponin I (High Sensitivity): 35 ng/L — ABNORMAL HIGH (ref <18)
Troponin I (High Sensitivity): 68 ng/L — ABNORMAL HIGH (ref <18)

## 2019-11-28 LAB — LIPASE, BLOOD: Lipase: 42 U/L (ref 11–51)

## 2019-11-28 LAB — PROTIME-INR
INR: 1.1 (ref 0.8–1.2)
Prothrombin Time: 13.6 seconds (ref 11.4–15.2)

## 2019-11-28 LAB — APTT: aPTT: 26 seconds (ref 24–36)

## 2019-11-28 LAB — GLUCOSE, CAPILLARY: Glucose-Capillary: 98 mg/dL (ref 70–99)

## 2019-11-28 MED ORDER — ALUM & MAG HYDROXIDE-SIMETH 200-200-20 MG/5ML PO SUSP
30.0000 mL | Freq: Once | ORAL | Status: AC
  Administered 2019-11-28: 30 mL via ORAL
  Filled 2019-11-28: qty 30

## 2019-11-28 MED ORDER — SODIUM CHLORIDE 0.9% FLUSH
3.0000 mL | Freq: Two times a day (BID) | INTRAVENOUS | Status: DC
  Administered 2019-11-29 – 2019-12-01 (×3): 3 mL via INTRAVENOUS

## 2019-11-28 MED ORDER — LACTATED RINGERS IV BOLUS (SEPSIS)
1000.0000 mL | Freq: Once | INTRAVENOUS | Status: AC
  Administered 2019-11-28: 1000 mL via INTRAVENOUS

## 2019-11-28 MED ORDER — SODIUM CHLORIDE 0.9% FLUSH: 3.0000 mL | INTRAVENOUS | Status: DC | PRN

## 2019-11-28 MED ORDER — ONDANSETRON HCL 4 MG/2ML IJ SOLN
4.0000 mg | INTRAMUSCULAR | Status: AC
  Administered 2019-11-28: 4 mg via INTRAVENOUS
  Filled 2019-11-28: qty 2

## 2019-11-28 MED ORDER — OMEGA-3-ACID ETHYL ESTERS 1 G PO CAPS
2.0000 g | ORAL_CAPSULE | Freq: Two times a day (BID) | ORAL | Status: DC
  Administered 2019-11-28 – 2019-12-01 (×6): 2 g via ORAL
  Filled 2019-11-28 (×6): qty 2

## 2019-11-28 MED ORDER — SODIUM CHLORIDE 0.9 % IV SOLN
1.0000 g | Freq: Three times a day (TID) | INTRAVENOUS | Status: DC
  Administered 2019-11-28 (×2): 1 g via INTRAVENOUS
  Filled 2019-11-28: qty 1

## 2019-11-28 MED ORDER — METRONIDAZOLE 500 MG PO TABS: 500.0000 mg | ORAL_TABLET | Freq: Three times a day (TID) | ORAL | Status: DC

## 2019-11-28 MED ORDER — EZETIMIBE 10 MG PO TABS
10.0000 mg | ORAL_TABLET | Freq: Every day | ORAL | Status: DC
  Administered 2019-11-28 – 2019-11-30 (×3): 10 mg via ORAL
  Filled 2019-11-28 (×3): qty 1

## 2019-11-28 MED ORDER — SODIUM CHLORIDE 0.9 % IV SOLN: 800.0000 mg | Freq: Once | INTRAVENOUS | Status: DC

## 2019-11-28 MED ORDER — HYDROMORPHONE HCL 1 MG/ML IJ SOLN: 1.0000 mg | INTRAMUSCULAR | Status: DC | PRN

## 2019-11-28 MED ORDER — INSULIN ASPART 100 UNIT/ML ~~LOC~~ SOLN: 0.0000 [IU] | SUBCUTANEOUS | Status: DC

## 2019-11-28 MED ORDER — SENNOSIDES-DOCUSATE SODIUM 8.6-50 MG PO TABS
2.0000 | ORAL_TABLET | Freq: Every day | ORAL | Status: DC
  Administered 2019-11-29 – 2019-11-30 (×3): 2 via ORAL
  Filled 2019-11-28 (×3): qty 2

## 2019-11-28 MED ORDER — SODIUM CHLORIDE 0.9 % IV SOLN: 2.0000 g | Freq: Three times a day (TID) | INTRAVENOUS | Status: DC

## 2019-11-28 MED ORDER — SODIUM CHLORIDE 0.9 % IV SOLN: 250.0000 mL | INTRAVENOUS | Status: DC | PRN

## 2019-11-28 MED ORDER — LACTATED RINGERS IV BOLUS (SEPSIS)
500.0000 mL | Freq: Once | INTRAVENOUS | Status: AC
  Administered 2019-11-28: 500 mL via INTRAVENOUS

## 2019-11-28 MED ORDER — ENOXAPARIN SODIUM 40 MG/0.4ML ~~LOC~~ SOLN
40.0000 mg | SUBCUTANEOUS | Status: DC
  Administered 2019-11-28: 40 mg via SUBCUTANEOUS
  Filled 2019-11-28: qty 0.4

## 2019-11-28 MED ORDER — METOPROLOL SUCCINATE ER 25 MG PO TB24
12.5000 mg | ORAL_TABLET | Freq: Every day | ORAL | Status: DC
  Administered 2019-11-28 – 2019-11-29 (×2): 12.5 mg via ORAL
  Filled 2019-11-28 (×2): qty 1

## 2019-11-28 MED ORDER — ONDANSETRON HCL 4 MG/2ML IJ SOLN
4.0000 mg | Freq: Four times a day (QID) | INTRAMUSCULAR | Status: DC | PRN
  Administered 2019-11-30: 4 mg via INTRAVENOUS

## 2019-11-28 MED ORDER — LIDOCAINE VISCOUS HCL 2 % MT SOLN
15.0000 mL | Freq: Once | OROMUCOSAL | Status: AC
  Administered 2019-11-28: 15 mL via ORAL
  Filled 2019-11-28: qty 15

## 2019-11-28 MED ORDER — SODIUM CHLORIDE 0.9 % IV SOLN
1.0000 g | Freq: Three times a day (TID) | INTRAVENOUS | Status: DC
  Filled 2019-11-28: qty 1

## 2019-11-28 MED ORDER — LACTATED RINGERS IV SOLN: INTRAVENOUS | Status: AC

## 2019-11-28 MED ORDER — IBUPROFEN 200 MG PO TABS
400.0000 mg | ORAL_TABLET | Freq: Four times a day (QID) | ORAL | Status: DC | PRN
  Administered 2019-11-29: 400 mg via ORAL
  Filled 2019-11-28: qty 1

## 2019-11-28 MED ORDER — PANTOPRAZOLE SODIUM 40 MG PO TBEC
40.0000 mg | DELAYED_RELEASE_TABLET | Freq: Every day | ORAL | Status: DC
  Administered 2019-11-28 – 2019-12-01 (×4): 40 mg via ORAL
  Filled 2019-11-28 (×4): qty 1

## 2019-11-28 MED ORDER — MORPHINE SULFATE (PF) 4 MG/ML IV SOLN
4.0000 mg | Freq: Once | INTRAVENOUS | Status: AC
  Administered 2019-11-28: 4 mg via INTRAVENOUS
  Filled 2019-11-28: qty 1

## 2019-11-28 MED ORDER — SODIUM CHLORIDE 0.9 % IV SOLN
1.0000 g | Freq: Three times a day (TID) | INTRAVENOUS | Status: DC
  Administered 2019-11-28 – 2019-11-30 (×5): 1 g via INTRAVENOUS
  Filled 2019-11-28 (×9): qty 1

## 2019-11-28 MED ORDER — ADULT MULTIVITAMIN W/MINERALS CH
1.0000 | ORAL_TABLET | Freq: Every day | ORAL | Status: DC
  Administered 2019-11-28 – 2019-12-01 (×4): 1 via ORAL
  Filled 2019-11-28 (×4): qty 1

## 2019-11-28 NOTE — Consult Note (Addendum)
Reason for Consult/Chief Complaint: rule out choledochollithiasis Consultant: Margo Aye, DO  Shawn Elliott is an 64 y.o. male.   HPI: 66M with a one day history of epigastric pain radiating to the right upper quadrant. Associated nausea, vomiting. Symptoms began ~1900 on 11/19 and when they didn't resolve around 0400, he presented to the ED. He reports one prior episode of similar symptoms a couple of months ago that resolved spontaneously. He describes his current symptoms are more severe than that episode.   Past Medical History:  Diagnosis Date  . Coronary artery disease   . GERD (gastroesophageal reflux disease)   . High cholesterol   . History of kidney stones    "passed them" (01/17/2016)  . Hypertension   . PONV (postoperative nausea and vomiting)    "when I woke up from my hernia I was nauseated"  . STEMI (ST elevation myocardial infarction) (HCC) 01/12/2016    Past Surgical History:  Procedure Laterality Date  . CARDIAC CATHETERIZATION N/A 01/12/2016   Procedure: Left Heart Cath and Coronary Angiography;  Surgeon: Lennette Bihari, MD;  Location: [facility] INVASIVE CV LAB;  Service: Cardiovascular;  Laterality: N/A;  . CARDIAC CATHETERIZATION N/A 01/12/2016   Procedure: Coronary Stent Intervention;  Surgeon: Lennette Bihari, MD;  Location: [facility] INVASIVE CV LAB;  Service: Cardiovascular;  Laterality: N/A;  . CARDIAC CATHETERIZATION N/A 01/17/2016   Procedure: Coronary Stent Intervention;  Surgeon: Lennette Bihari, MD;  Location: [facility] INVASIVE CV LAB;  Service: Cardiovascular;  Laterality: N/A;  . CARDIAC CATHETERIZATION N/A 01/17/2016   Procedure: Coronary Balloon Angioplasty;  Surgeon: Lennette Bihari, MD;  Location: [facility] INVASIVE CV LAB;  Service: Cardiovascular;  Laterality: N/A;  . COLONOSCOPY  ~ 2008  . CORONARY ANGIOPLASTY WITH STENT PLACEMENT  01/17/2016   "3 01/12/2016; 3 01/17/2016"  . GANGLION CYST EXCISION Right ~ 2016  . INGUINAL HERNIA REPAIR Right ~ 2014  . kidney stones  2010     Family History  Problem Relation Age of Onset  . Heart attack Mother   . Diabetes Mother   . Stroke Mother   . Hypertension Mother   . Hyperlipidemia Mother   . Diabetes Father   . Stroke Father   . Hyperlipidemia Father   . Hypertension Father   . Hyperlipidemia Sister   . Hypertension Sister   . Hypertension Brother   . Hyperlipidemia Brother   . Diabetes Brother     Social History:  reports that he quit smoking about 39 years ago. His smoking use included pipe. He has never used smokeless tobacco. He reports current alcohol use of about 1.0 standard drink of alcohol per week. He reports that he does not use drugs.  Allergies:  Allergies  Allergen Reactions  . Penicillins Hives    Childhood allergic reaction Has patient had a PCN reaction causing immediate rash, facial/tongue/throat swelling, SOB or lightheadedness with hypotension: Yes Has patient had a PCN reaction causing severe rash involving mucus membranes or skin necrosis: No Has patient had a PCN reaction that required hospitalization No Has patient had a PCN reaction occurring within the last 10 years: No If all of the above answers are "NO", then may proceed with Cephalosporin use.    Medications: I have reviewed the patient's current medications.  Results for orders placed or performed during the hospital encounter of 11/28/19 (from the past 48 hour(s))  Glucose, capillary     Status: None   Collection Time: 11/28/19  8:44 PM  Result Value  Ref Range   Glucose-Capillary 98 70 - 99 mg/dL    Comment: Glucose reference range applies only to samples taken after fasting for at least 8 hours.    DG Chest 2 View  Result Date: 11/28/2019 CLINICAL DATA:  Chest pain. EXAM: CHEST - 2 VIEW COMPARISON:  05/29/2017 and prior radiographs FINDINGS: Cardiomediastinal silhouette is unchanged with evidence of CABG. A LEFT-sided pacemaker/AICD is new since 05/29/2017. Minimal bibasilar scarring again noted. There is no  evidence of focal airspace disease, pulmonary edema, suspicious pulmonary nodule/mass, pleural effusion, or pneumothorax. No acute bony abnormalities are identified. IMPRESSION: No evidence of acute cardiopulmonary disease. Electronically Signed   By: Harmon Pier M.D.   On: 11/28/2019 06:51   US Aorta  Result Date: 11/28/2019 CLINICAL DATA:  64 year old male with abdominal pain and nausea. EXAM: ULTRASOUND OF ABDOMINAL AORTA TECHNIQUE: Ultrasound examination of the abdominal aorta and proximal common iliac arteries was performed to evaluate for aneurysm. Additional color and Doppler images of the distal aorta were obtained to document patency. COMPARISON:  None. FINDINGS: Abdominal aortic measurements as follows: Proximal: Not well visualized Mid:  2 cm Distal:  1.8 cm Patent: Yes, peak systolic velocity is 279 cm/s Right common iliac artery: 1.2 cm Left common iliac artery: 1.2 cm Aortic atherosclerotic plaque is noted. IMPRESSION: 1. No evidence of abdominal aortic aneurysm but proximal aorta not well visualized. 2.  Aortic Atherosclerosis (ICD10-I70.0). Electronically Signed   By: Harmon Pier M.D.   On: 11/28/2019 08:27   US ABDOMEN LIMITED RUQ (LIVER/GB)  Result Date: 11/28/2019 CLINICAL DATA:  Upper abdominal pain with nausea for 1 day EXAM: ULTRASOUND ABDOMEN LIMITED RIGHT UPPER QUADRANT COMPARISON:  Abdominal CT April 08, 2011 FINDINGS: Gallbladder: Shadowing gallstones.  No wall thickening or focal tenderness. Common bile duct: Diameter: 1 cm.  Where visualized, no filling defect. Liver: Increased echogenicity. No evidence of mass lesion. Left lobe was not well visualized due to sonographic windows. Portal vein is patent on color Doppler imaging with normal direction of blood flow towards the liver. Other: Right renal cortical thinning where seen. IMPRESSION: 1. Cholelithiasis without findings of acute cholecystitis. 2. Dilated common bile duct. No visible choledocholithiasis, but partially  covered. Consider MRCP given the biliary labs. 3. Hepatic steatosis. 4. Right renal cortical thinning. Electronically Signed   By: Marnee Spring M.D.   On: 11/28/2019 08:27    ROS 10 point review of systems is negative except as listed above in HPI.   Physical Exam Blood pressure 122/72, pulse 83, temperature 99 F (37.2 C), temperature source Oral, resp. rate 18, SpO2 98 %. Constitutional: well-developed, well-nourished HEENT: pupils equal, round, reactive to light, 28mm b/l, moist conjunctiva, external inspection of ears and nose normal, hearing intact Oropharynx: normal oropharyngeal mucosa, normal dentition Neck: no thyromegaly, trachea midline, no midline cervical tenderness to palpation Chest: breath sounds equal bilaterally, normal respiratory effort, no midline or lateral chest wall tenderness to palpation/deformity, midline sternal incision c/w prior CABG Abdomen: soft, obese, NT, no bruising, no hepatosplenomegaly GU: no blood at urethral meatus of penis, no scrotal masses or abnormality  Back: no wounds, no thoracic/lumbar spine tenderness to palpation, no thoracic/lumbar spine stepoffs Rectal: deferred Extremities: 2+ radial and pedal pulses bilaterally, motor and sensation intact to bilateral UE and LE, no peripheral edema MSK: normal gait/station, no clubbing/cyanosis of fingers/toes, normal ROM of all four extremities Skin: warm, dry, no rashes Psych: normal memory, normal mood/affect    Assessment/Plan: 35M with enlarged common bile duct and cholelithiasis  Suspect choledocholithiasis vs passed CBD stone given transaminitis and hyperbilirubinemia. Recommend MRCP to better evaluate and ERCP vs lap chole with IOC based on results. Patient provided cards with information regarding his pacemaker and stents, photos taken and entered in the chart. Recommend GI consult and Cardiology consult for risk stratification. Recommend hydration and trending of LFTs, check lipase/amylase,  antibiotics-zosyn, NPO at midnight.          Diamantina Monks, MD General and Trauma Surgery [facility]

## 2019-11-28 NOTE — ED Provider Notes (Signed)
[facility] Emergency Department Provider Note  ____________________________________________   First MD Initiated Contact with Patient 11/28/19 [phone]     (approximate)  I have reviewed the triage vital signs and the nursing notes.   HISTORY  Chief Complaint Abdominal Pain    HPI Shawn Elliott is a 64 y.o. male with medical history as listed below but most notably a history of STEMI, some resultant cardiomyopathy and pacemaker placement.   He presents for evaluation of acute onset and severe pain in his upper abdomen or lower chest.  He states that it occurred suddenly around 7 PM while he was driving home from [place].  The pain is both sharp and aching and radiates from his upper abdomen into his chest and through to his back.  Is accompanied with nausea and two episodes of vomiting.  It has happened similarly in the past, several times over the last few weeks, but this was by far the most severe.  Nothing particular made the pain tonight better or worse and he said it continued for about 8 hours or more before he decided come to the emergency department.  No known fever.  Denies sore throat.  Does not believe he is having chest pain but rather upper abdominal pain but he is also concerned about his heart given his history.  No shortness of breath and no diaphoresis.  No dysuria.  Symptoms are severe and acute in onset.  Patient does not take any anticoagulation other than aspirin.        Past Medical History:  Diagnosis Date  . Coronary artery disease   . GERD (gastroesophageal reflux disease)   . High cholesterol   . History of kidney stones    "passed them" (01/17/2016)  . Hypertension   . PONV (postoperative nausea and vomiting)    "when I woke up from my hernia I was nauseated"  . STEMI (ST elevation myocardial infarction) (HCC) 01/12/2016    Patient Active Problem List   Diagnosis Date Noted  . Chest pain 05/29/2017  . 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 Atrial fibrillation (HCC) 03/16/2016  . Status post coronary artery stent placement   . Obesity (BMI 30-39.9)   . Hyperlipidemia LDL goal <70   . Elevated liver function tests   . Acute ST elevation myocardial infarction (STEMI) involving right coronary artery (HCC) 01/12/2016  . Coronary artery disease involving native coronary artery of native heart with unstable angina pectoris (HCC) 01/12/2016  . Atrial fibrillation with rapid ventricular response (HCC) 01/12/2016  . 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 Acute ST elevation myocardial infarction (STEMI) due to occlusion of right coronary artery (HCC)   . Coronary artery disease involving native coronary artery with unstable angina pectoris [facility])     Past Surgical History:  Procedure Laterality Date  . CARDIAC CATHETERIZATION N/A 01/12/2016   Procedure: Left Heart Cath and Coronary Angiography;  Surgeon: Lennette Bihari, MD;  Location: [facility] INVASIVE CV LAB;  Service: Cardiovascular;  Laterality: N/A;  . CARDIAC CATHETERIZATION N/A 01/12/2016   Procedure: Coronary Stent Intervention;  Surgeon: Lennette Bihari, MD;  Location: [facility] INVASIVE CV LAB;  Service: Cardiovascular;  Laterality: N/A;  . CARDIAC CATHETERIZATION N/A 01/17/2016   Procedure: Coronary Stent Intervention;  Surgeon: Lennette Bihari, MD;  Location: [facility] INVASIVE CV LAB;  Service: Cardiovascular;  Laterality: N/A;  . CARDIAC CATHETERIZATION N/A 01/17/2016   Procedure: Coronary Balloon Angioplasty;  Surgeon: Lennette Bihari, MD;  Location: [facility] INVASIVE  CV LAB;  Service: Cardiovascular;  Laterality: N/A;  . COLONOSCOPY  ~ 2008  . CORONARY ANGIOPLASTY WITH STENT PLACEMENT  01/17/2016   "3 01/12/2016; 3 01/17/2016"  . GANGLION CYST EXCISION Right ~ 2016  . INGUINAL HERNIA REPAIR Right ~ 2014  . kidney stones  2010    Prior to Admission medications   Medication Sig Start Date End Date Taking? Authorizing Provider  aspirin EC 81 MG tablet Take 81 mg by mouth daily.    [provider]  atorvastatin (LIPITOR) 80 MG tablet Take 1 tablet (80 mg total) by mouth daily at 6 PM. Patient taking differently: Take 80 mg by mouth at bedtime.  02/06/16   Almond Lint, MD  lisinopril (PRINIVIL,ZESTRIL) 5 MG tablet Take 5 mg by mouth daily.    [provider]  metoprolol succinate (TOPROL-XL) 100 MG 24 hr tablet Take 100 mg by mouth daily. Take with or immediately following a meal.    [provider]  nitroGLYCERIN (NITROSTAT) 0.4 MG SL tablet Place 1 tablet (0.4 mg total) under the tongue every 5 (five) minutes as needed for chest pain. 02/02/16   Almond Lint, MD  omeprazole (PRILOSEC) 20 MG capsule Take 20 mg by mouth daily. 12/04/15   [provider]  spironolactone (ALDACTONE) 25 MG tablet Take 25 mg by mouth daily.    [provider]    Allergies Penicillins  Family History  Problem Relation Age of Onset  . Heart attack Mother   . Diabetes Mother   . Stroke Mother   . Hypertension Mother   . Hyperlipidemia Mother   . Diabetes Father   . Stroke Father   . Hyperlipidemia Father   . Hypertension Father   . Hyperlipidemia Sister   . Hypertension Sister   . Hypertension Brother   . Hyperlipidemia Brother   . Diabetes Brother     Social History Social History   Tobacco Use  . Smoking status: Former Smoker    Types: Pipe    Quit date: 1982    Years since quitting: 39.9  . Smokeless tobacco: Never Used  Substance Use Topics  . Alcohol use: Yes    Alcohol/week: 1.0 standard drink    Types: 1 Cans of beer per week  . Drug use: No    Review of Systems Constitutional: No fever/chills Eyes: No visual changes. ENT: No sore throat. Cardiovascular: Upper abdominal versus lower chest pain. Respiratory: Denies shortness of breath. Gastrointestinal: Severe upper abdominal pain associated with nausea and vomiting. Genitourinary: Negative for dysuria. Musculoskeletal: Negative for neck pain.  Negative for back  pain. Integumentary: Negative for rash. Neurological: Negative for headaches, focal weakness or numbness.   ____________________________________________   PHYSICAL EXAM:  VITAL SIGNS: ED Triage Vitals  Enc Vitals Group     BP 11/28/19 0433 (!) 174/81     Pulse Rate 11/28/19 0433 91     Resp 11/28/19 0433 18     Temp --      Temp src --      SpO2 11/28/19 0433 94 %     Weight 11/28/19 0436 108.9 kg (240 lb)     Height 11/28/19 0436 1.88 m (6\' 2" )     Head Circumference --      Peak Flow --      Pain Score 11/28/19 0435 10     Pain Loc --      Pain Edu? --      Excl. in GC? --  Constitutional: Alert and oriented.  Appears very uncomfortable. Eyes: Conjunctivae are normal.  Head: Atraumatic. Nose: No congestion/rhinnorhea. Mouth/Throat: Patient is wearing a mask. Neck: No stridor.  No meningeal signs.   Cardiovascular: Normal rate, regular rhythm. Good peripheral circulation. Grossly normal heart sounds.  Pacemaker in place. Respiratory: Normal respiratory effort.  No retractions. Gastrointestinal: Obese.  Soft with tenderness to palpation in the right upper quadrant and equivocal Murphy sign.  No lower abdominal tenderness. Musculoskeletal: No lower extremity tenderness nor edema. No gross deformities of extremities. Neurologic:  Normal speech and language. No gross focal neurologic deficits are appreciated.  Skin:  Skin is warm, dry and intact. Psychiatric: Mood and affect are normal. Speech and behavior are normal.  ____________________________________________   LABS (all labs ordered are listed, but only abnormal results are displayed)  Labs Reviewed  CBC WITH DIFFERENTIAL/PLATELET - Abnormal; Notable for the following components:      Result Value   WBC 21.2 (*)    Neutro Abs 19.4 (*)    Lymphs Abs 0.6 (*)    Abs Immature Granulocytes 0.14 (*)    All other components within normal limits  COMPREHENSIVE METABOLIC PANEL - Abnormal; Notable for the following  components:   Glucose, Bld 168 (*)    BUN 27 (*)    Calcium 8.5 (*)    AST 404 (*)    ALT 419 (*)    Alkaline Phosphatase 155 (*)    Total Bilirubin 2.7 (*)    All other components within normal limits  TROPONIN I (HIGH SENSITIVITY) - Abnormal; Notable for the following components:   Troponin I (High Sensitivity) 35 (*)    All other components within normal limits  RESP PANEL BY RT-PCR (FLU A&B, COVID) ARPGX2  CULTURE, BLOOD (ROUTINE X 2)  CULTURE, BLOOD (ROUTINE X 2)  URINE CULTURE  LIPASE, BLOOD  LACTIC ACID, PLASMA  LACTIC ACID, PLASMA  PROTIME-INR  APTT  URINALYSIS, COMPLETE (UACMP) WITH MICROSCOPIC  TROPONIN I (HIGH SENSITIVITY)   ____________________________________________  EKG  ED ECG REPORT I, Loleta Rose, the attending physician, personally viewed and interpreted this ECG.  Date: 11/28/2019 EKG Time: 4:34 AM Rate: 95 Rhythm: normal sinus rhythm QRS Axis: Indeterminate axis Intervals: Minimally prolonged QTc interval at 502 ms ST/T Wave abnormalities: Non-specific ST segment / T-wave changes, but no clear evidence of acute ischemia. Narrative Interpretation: no definitive evidence of acute ischemia; does not meet STEMI criteria.   ____________________________________________  RADIOLOGY I, Loleta Rose, personally viewed and evaluated these images (plain radiographs) as part of my medical decision making, as well as reviewing the written report by the radiologist.  ED MD interpretation: No acute abnormalities identified on chest x-ray.  Ultrasound of the gallbladder and aorta are pending at the time of transfer of care.  Official radiology report(s): DG Chest 2 View  Result Date: 11/28/2019 CLINICAL DATA:  Chest pain. EXAM: CHEST - 2 VIEW COMPARISON:  05/29/2017 and prior radiographs FINDINGS: Cardiomediastinal silhouette is unchanged with evidence of CABG. A LEFT-sided pacemaker/AICD is new since 05/29/2017. Minimal bibasilar scarring again noted. There  is no evidence of focal airspace disease, pulmonary edema, suspicious pulmonary nodule/mass, pleural effusion, or pneumothorax. No acute bony abnormalities are identified. IMPRESSION: No evidence of acute cardiopulmonary disease. Electronically Signed   By: Harmon Pier M.D.   On: 11/28/2019 06:51    ____________________________________________   PROCEDURES   Procedure(s) performed (including Critical Care):  .Critical Care Performed by: Loleta Rose, MD Authorized by: Loleta Rose, MD   Critical care  provider statement:    Critical care time (minutes):  45   Critical care time was exclusive of:  Separately billable procedures and treating other patients   Critical care was necessary to treat or prevent imminent or life-threatening deterioration of the following conditions:  Sepsis   Critical care was time spent personally by me on the following activities:  Development of treatment plan with patient or surrogate, discussions with consultants, evaluation of patient's response to treatment, examination of patient, obtaining history from patient or surrogate, ordering and performing treatments and interventions, ordering and review of laboratory studies, ordering and review of radiographic studies, pulse oximetry, re-evaluation of patient's condition and review of old charts .1-3 Lead EKG Interpretation Performed by: Loleta Rose, MD Authorized by: Loleta Rose, MD     Interpretation: normal     ECG rate:  95   ECG rate assessment: normal     Rhythm: sinus rhythm     Ectopy: none     Conduction: normal       ____________________________________________   INITIAL IMPRESSION / MDM / ASSESSMENT AND PLAN / ED COURSE  As part of my medical decision making, I reviewed the following data within the electronic MEDICAL RECORD NUMBER Nursing notes reviewed and incorporated, Labs reviewed , EKG interpreted , Old chart reviewed, Patient signed out to Dr. Marisa Severin and reviewed Notes from prior  ED visits   Differential diagnosis includes, but is not limited to, biliary colic, pancreatitis, ACS, aortic dissection.  The patient is on the cardiac monitor to evaluate for evidence of arrhythmia and/or significant heart rate changes.  Based on the patient's history and physical exam, I suspect biliary disease more than cardiac although he is of course at high risk of cardiac issues.  His EKG is not normal but does not indicate acute ischemia and he feels much better after receiving morphine 4 mg IV and Zofran 4 mg IV.  I have ordered broad lab work including high-sensitivity troponin, CMP, lipase, and CBC.  I also ordered an ultrasound of the right upper quadrant and the aorta but I have a lower suspicion of aortic dissection.  The patient was in severe pain when he arrived but is now comfortable even though he still feels some discomfort.  I explained to him my suspicions of biliary colic and he agrees with the plan for ultrasound and lab work.     Clinical Course as of Nov 28 726  Sat Nov 28, 2019  0600 Lipase: 42 [CF]  0625 WBC(!): 21.2 [CF]  0640 The patient's CMP is notable for normal renal function but transaminitis with AST and ALT in the 400s, elevated alkaline phosphatase of 155, and a total bilirubin of 2.7.  This is very concerning for cholecystitis and/or choledocholithiasis.  The patient is afebrile making a sending cholangitis less likely but it is still concerned and he is certainly at risk.   [CF]  [phone] Given the patient's LFT elevation including bilirubin of 2.7 suggestive of a biliary obstruction and his white blood cell count of 21, I am going to give him empiric antibiotics for high risk cholecystitis.  The normal recommendation would be Zosyn but he has an allergy listed to penicillins.  I discussed the case with Barbara Cower the pharmacist.  He looked back through the medical record and the patient has not received any penicillins that we can find in the system.  As an  alternative, given the national shortage of metronidazole IV, Barbara Cower recommended meropenem 1 g IV as a  single agent coverage.  I have placed the order.   [CF]  [phone] The patient meets sepsis criteria based on a heart rate greater than 90 and white blood cell count of 21.  In addition to ordering the empiric antibiotics ordered the sepsis order set and ordered 1 L lactated Ringer's while awaiting the first lactic acid.  Cultures have been ordered as well and we will attempt to get the cultures before giving antibiotics but will not delay antibiotics.  Temperature was approximately 98 degrees.   [CF]  [phone] likely demand ischemia in the setting of sepsis, but ACS not ruled out  Troponin I (High Sensitivity)(!): 35 [CF]  [phone] I personally reviewed the patient's imaging and agree with the radiologist's interpretation that there is no evidence of acute abnormality on chest x-ray.  DG Chest 2 View [CF]  [phone] Transferring ED care to Dr. Marisa Severin to follow up ultrasound and disposition patient appropriately.  Nursing staff is working on additional IV access and sepsis protocol.  I updated the patient and reassessed him.  He remains in only mild discomfort and understands and agrees with the plan.   [CF]    Clinical Course User Index [CF] Loleta Rose, MD     ____________________________________________  FINAL CLINICAL IMPRESSION(S) / ED DIAGNOSES  Final diagnoses:  Sepsis, due to unspecified organism, unspecified whether acute organ dysfunction present (HCC)  Transaminitis  Elevated bilirubin  Leukocytosis, unspecified type  Upper abdominal pain     MEDICATIONS GIVEN DURING THIS VISIT:  Medications  meropenem (MERREM) 1 g in sodium chloride 0.9 % 100 mL IVPB (has no administration in time range)  lactated ringers bolus 1,000 mL (has no administration in time range)  alum & mag hydroxide-simeth (MAALOX/MYLANTA) 200-200-20 MG/5ML suspension 30 mL (30 mLs Oral Given 11/28/19 0458)    And   lidocaine (XYLOCAINE) 2 % viscous mouth solution 15 mL (15 mLs Oral Given 11/28/19 0459)  morphine 4 MG/ML injection 4 mg (4 mg Intravenous Given 11/28/19 0535)  ondansetron (ZOFRAN) injection 4 mg (4 mg Intravenous Given 11/28/19 0535)     ED Discharge Orders    None      *Please note:  Shawn Elliott was evaluated in Emergency Department on 11/28/2019 for the symptoms described in the history of present illness. He was evaluated in the context of the global COVID-19 pandemic, which necessitated consideration that the patient might be at risk for infection with the SARS-CoV-2 virus that causes COVID-19. Institutional protocols and algorithms that pertain to the evaluation of patients at risk for COVID-19 are in a state of rapid change based on information released by regulatory bodies including the CDC and federal and state organizations. These policies and algorithms were followed during the patient's care in the ED.  Some ED evaluations and interventions may be delayed as a result of limited staffing during and after the pandemic.*  Note:  This document was prepared using Dragon voice recognition software and may include unintentional dictation errors.   Loleta Rose, MD 11/28/19 [phone]

## 2019-11-28 NOTE — ED Notes (Signed)
LAB AT BEDSIDE

## 2019-11-28 NOTE — ED Notes (Signed)
Ruby called with bed assignment for patient  [facility] at [facility]

## 2019-11-28 NOTE — ED Provider Notes (Signed)
-----------------------------------------   9:27 AM on 11/28/2019 -----------------------------------------  I took over care on this patient from Dr. York Cerise.  Ultrasound shows a dilated CBD and cholelithiasis without evidence of acute cholecystitis.  Lab work-up reveals elevated WBC, lactate, and LFTs.  The patient's bilirubin is 2.7.  Presentation is concerning for choledocholithiasis or possible cholangitis.  Antibiotics were already started by Dr. York Cerise.  I have ordered fluids per the sepsis protocol based on the elevated lactate.  I updated the patient on the findings.  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  I discussed the case with Dr. Norma Fredrickson from GI to determine if any other alternative tests are indicated, or whether the patient would need to proceed to ERCP.  Dr. Norma Fredrickson advises that the patient will need ERCP, and thus transfer since we do not have an ERCP provider here at [facility] this weekend.  I have paged out to the transfer center at [facility].  ----------------------------------------- 10:16 AM on 11/28/2019 -----------------------------------------  I discussed the case with Dr. Ewing Schlein from GI at [facility] who agrees with the plan for transfer for ERCP.  I then discussed the case with Dr. Katrinka Blazing from the hospitalist service at [facility] who has accepted the patient for transfer.  The patient is stable for transfer at this time.   Dionne Bucy, MD 11/28/19 [id number]

## 2019-11-28 NOTE — H&P (Addendum)
History and Physical  Shawn Elliott JYN:[id number] DOB: September 27, 1955 DOA: 11/28/2019  Referring physician: Dr Katrinka Blazing, [facility], accepted transfer from [facility] ED.  PCP: Manfred Arch, MD  Outpatient Specialists: Cardiology Patient coming from: Home.  Chief Complaint: Severe right upper quadrant abdominal pain, nausea and vomiting.  HPI: Shawn Elliott is a 64 y.o. male with medical history significant for essential hypertension, hyperlipidemia, GERD, coronary artery disease status post CABG with stents, chronic systolic CHF with EF 40-45%, status post AICD placement, obesity, who initially presented to [facility] ED due to severe right upper quadrant abdominal pain associated with nausea and vomiting x4.  Onset of symptoms the evening of 11/27/2019 around 7 PM when he started having dull pain in his epigastric and right upper quadrant abdomen that radiated to his back.  His symptoms subsided, however early morning of 11/28/2019 around 4 AM, he was awakened by severe right upper quadrant abdominal pain and nausea, he vomited 4 times.  Drove himself to [facility] ED.  He had a Right upper quadrant abdominal ultrasound which revealed cholelithiasis with common bile duct dilatation with concern for choledocholithiasis.  Patient had leukocytosis and elevated lactic acid.  Received 3.5L IV fluid and IV Meropenem empirically.  GI was consulted by EDP for possible ERCP and was transferred to [facility] as a direct admit to telemetry medical surgical floor.  TRH, hospitalist team was asked to admit.  HIDA scan ordered and pending at [facility].  General surgery has been consulted, Dr. Bedelia Person.  Patient is currently n.p.o. until seen by general surgery.  At the time of this visit, the patient is alert and oriented x4.  He denies any chest pain or dyspnea.  His GI symptomatology is improved.  We will repeat CMP, CBC with differential, lactic acid, and lipase level.  ED Course:  Afebrile, respiration rate 23-30.  Elevated  chemistries, AST 404, ALT 419, total bilirubin 2.7.  Troponin 35, 68.  Lactic acid 3.3, 2.5.  Right upper quadrant abdominal ultrasound revealing evidence of gallstones and common bile duct dilatation with concern for possible choledocholithiasis.  He received 3.5 L of lactated Ringer in the ED at [facility] and empiric IV antibiotics, meropenem.  GI consulted.  Dr. Ewing Schlein.  Review of Systems: Review of systems as noted in the HPI. All other systems reviewed and are negative.   Past Medical History:  Diagnosis Date  . Coronary artery disease   . GERD (gastroesophageal reflux disease)   . High cholesterol   . History of kidney stones    "passed them" (01/17/2016)  . Hypertension   . PONV (postoperative nausea and vomiting)    "when I woke up from my hernia I was nauseated"  . STEMI (ST elevation myocardial infarction) (HCC) 01/12/2016   Past Surgical History:  Procedure Laterality Date  . CARDIAC CATHETERIZATION N/A 01/12/2016   Procedure: Left Heart Cath and Coronary Angiography;  Surgeon: Lennette Bihari, MD;  Location: [facility] INVASIVE CV LAB;  Service: Cardiovascular;  Laterality: N/A;  . CARDIAC CATHETERIZATION N/A 01/12/2016   Procedure: Coronary Stent Intervention;  Surgeon: Lennette Bihari, MD;  Location: [facility] INVASIVE CV LAB;  Service: Cardiovascular;  Laterality: N/A;  . CARDIAC CATHETERIZATION N/A 01/17/2016   Procedure: Coronary Stent Intervention;  Surgeon: Lennette Bihari, MD;  Location: [facility] INVASIVE CV LAB;  Service: Cardiovascular;  Laterality: N/A;  . CARDIAC CATHETERIZATION N/A 01/17/2016   Procedure: Coronary Balloon Angioplasty;  Surgeon: Lennette Bihari, MD;  Location: [facility] INVASIVE CV LAB;  Service: Cardiovascular;  Laterality: N/A;  . COLONOSCOPY  ~ 2008  . CORONARY ANGIOPLASTY WITH STENT PLACEMENT  01/17/2016   "3 01/12/2016; 3 01/17/2016"  . GANGLION CYST EXCISION Right ~ 2016  . INGUINAL HERNIA REPAIR Right ~ 2014  . kidney stones  2010    Social History:   reports that he quit smoking about 39 years ago. His smoking use included pipe. He has never used smokeless tobacco. He reports current alcohol use of about 1.0 standard drink of alcohol per week. He reports that he does not use drugs.   Allergies  Allergen Reactions  . Penicillins Hives    Childhood allergic reaction Has patient had a PCN reaction causing immediate rash, facial/tongue/throat swelling, SOB or lightheadedness with hypotension: Yes Has patient had a PCN reaction causing severe rash involving mucus membranes or skin necrosis: No Has patient had a PCN reaction that required hospitalization No Has patient had a PCN reaction occurring within the last 10 years: No If all of the above answers are "NO", then may proceed with Cephalosporin use.    Family History  Problem Relation Age of Onset  . Heart attack Mother   . Diabetes Mother   . Stroke Mother   . Hypertension Mother   . Hyperlipidemia Mother   . Diabetes Father   . Stroke Father   . Hyperlipidemia Father   . Hypertension Father   . Hyperlipidemia Sister   . Hypertension Sister   . Hypertension Brother   . Hyperlipidemia Brother   . Diabetes Brother       Prior to Admission medications   Medication Sig Start Date End Date Taking? Authorizing Provider  aspirin EC 81 MG tablet Take 81 mg by mouth daily.    [provider]  atorvastatin (LIPITOR) 80 MG tablet Take 1 tablet (80 mg total) by mouth daily at 6 PM. Patient taking differently: Take 80 mg by mouth at bedtime.  02/06/16   Almond Lint, MD  lisinopril (PRINIVIL,ZESTRIL) 5 MG tablet Take 5 mg by mouth daily.    [provider]  metoprolol succinate (TOPROL-XL) 100 MG 24 hr tablet Take 100 mg by mouth daily. Take with or immediately following a meal.    [provider]  nitroGLYCERIN (NITROSTAT) 0.4 MG SL tablet Place 1 tablet (0.4 mg total) under the tongue every 5 (five) minutes as needed for chest pain. 02/02/16   Almond Lint,  MD  omeprazole (PRILOSEC) 20 MG capsule Take 20 mg by mouth daily. 12/04/15   [provider]  spironolactone (ALDACTONE) 25 MG tablet Take 25 mg by mouth daily.    [provider]    Physical Exam: There were no vitals taken for this visit.  . General: 64 y.o. year-old male well developed well nourished in no acute distress.  Alert and oriented x3. . Cardiovascular: Regular rate and rhythm with no rubs or gallops.  No thyromegaly or JVD noted.  1+ pitting edema in lower extremities bilaterally.   Marland Kitchen Respiratory: Clear to auscultation with no wheezes or rales. Good inspiratory effort. . Abdomen: Soft nontender nondistended with normal bowel sounds x4 quadrants. . Muskuloskeletal: No cyanosis or clubbing.  1+ pitting edema in lower extremities bilaterally.   . Neuro: CN II-XII intact, strength, sensation, reflexes . Skin: No ulcerative lesions noted or rashes . Psychiatry: Judgement and insight appear normal. Mood is appropriate for condition and setting          Labs on Admission:  Basic Metabolic Panel: Recent Labs  Lab 11/28/19 0525  NA 140  K 3.5  CL 108  CO2 22  GLUCOSE 168*  BUN 27*  CREATININE 1.11  CALCIUM 8.5*   Liver Function Tests: Recent Labs  Lab 11/28/19 0525  AST 404*  ALT 419*  ALKPHOS 155*  BILITOT 2.7*  PROT 7.1  ALBUMIN 4.2   Recent Labs  Lab 11/28/19 0525  LIPASE 42   No results for input(s): AMMONIA in the last 168 hours. CBC: Recent Labs  Lab 11/28/19 0525  WBC 21.2*  NEUTROABS 19.4*  HGB 15.7  HCT 46.6  MCV 92.6  PLT 232   Cardiac Enzymes: No results for input(s): CKTOTAL, CKMB, CKMBINDEX, TROPONINI in the last 168 hours.  BNP (last 3 results) No results for input(s): BNP in the last 8760 hours.  ProBNP (last 3 results) No results for input(s): PROBNP in the last 8760 hours.  CBG: No results for input(s): GLUCAP in the last 168 hours.  Radiological Exams on Admission: DG Chest 2 View  Result Date:  11/28/2019 CLINICAL DATA:  Chest pain. EXAM: CHEST - 2 VIEW COMPARISON:  05/29/2017 and prior radiographs FINDINGS: Cardiomediastinal silhouette is unchanged with evidence of CABG. A LEFT-sided pacemaker/AICD is new since 05/29/2017. Minimal bibasilar scarring again noted. There is no evidence of focal airspace disease, pulmonary edema, suspicious pulmonary nodule/mass, pleural effusion, or pneumothorax. No acute bony abnormalities are identified. IMPRESSION: No evidence of acute cardiopulmonary disease. Electronically Signed   By: Harmon Pier M.D.   On: 11/28/2019 06:51   US Aorta  Result Date: 11/28/2019 CLINICAL DATA:  64 year old male with abdominal pain and nausea. EXAM: ULTRASOUND OF ABDOMINAL AORTA TECHNIQUE: Ultrasound examination of the abdominal aorta and proximal common iliac arteries was performed to evaluate for aneurysm. Additional color and Doppler images of the distal aorta were obtained to document patency. COMPARISON:  None. FINDINGS: Abdominal aortic measurements as follows: Proximal: Not well visualized Mid:  2 cm Distal:  1.8 cm Patent: Yes, peak systolic velocity is 279 cm/s Right common iliac artery: 1.2 cm Left common iliac artery: 1.2 cm Aortic atherosclerotic plaque is noted. IMPRESSION: 1. No evidence of abdominal aortic aneurysm but proximal aorta not well visualized. 2.  Aortic Atherosclerosis (ICD10-I70.0). Electronically Signed   By: Harmon Pier M.D.   On: 11/28/2019 08:27   US ABDOMEN LIMITED RUQ (LIVER/GB)  Result Date: 11/28/2019 CLINICAL DATA:  Upper abdominal pain with nausea for 1 day EXAM: ULTRASOUND ABDOMEN LIMITED RIGHT UPPER QUADRANT COMPARISON:  Abdominal CT April 08, 2011 FINDINGS: Gallbladder: Shadowing gallstones.  No wall thickening or focal tenderness. Common bile duct: Diameter: 1 cm.  Where visualized, no filling defect. Liver: Increased echogenicity. No evidence of mass lesion. Left lobe was not well visualized due to sonographic windows. Portal vein is  patent on color Doppler imaging with normal direction of blood flow towards the liver. Other: Right renal cortical thinning where seen. IMPRESSION: 1. Cholelithiasis without findings of acute cholecystitis. 2. Dilated common bile duct. No visible choledocholithiasis, but partially covered. Consider MRCP given the biliary labs. 3. Hepatic steatosis. 4. Right renal cortical thinning. Electronically Signed   By: Marnee Spring M.D.   On: 11/28/2019 08:27    EKG: I independently viewed the EKG done and my findings are as followed: Sinus rhythm rate of 74.  No specific ST-T changes.  Assessment/Plan Present on Admission: . RUQ abdominal pain  Active Problems:   RUQ abdominal pain  Right upper quadrant abdominal pain, rule out acute cholecystitis. Presented with severe right upper quadrant  with onset x1 day Second episode, first episode occurred 2 weeks prior to this morning, lasted for 2 hours and resolved spontaneously. Right upper quadrant abdominal ultrasound revealing evidence of gallstone and common bile duct dilatation with concern for possible choledocholithiasis. Leukocytosis, tachypnea, elevated lactic acid. Started on IV antibiotics empirically, received meropenem in the ED at [facility], resume. Repeat CBC with differential, lactic acid Follow blood cultures x2, drawn on November 28, 2019 Closely monitor vital signs, maintain MAP greater than 65  Presumed sepsis secondary to acute cholecystitis No evidence of acute cholecystitis on ultrasound, HIDA scan ordered and pending. Presented with leukocytosis and tachypnea Received 3.5 L of IV fluid in the ED and [facility] Repeat lactic acid level  Start gentle IV fluid hydration LR at 50 cc/h x 1 day, closely monitor volume status while on IV fluid in the setting of chronic systolic CHF EF 40-45%. Continue IV antibiotics empirically Monitor fever curve and wbc  Acute transaminitis in the setting of suspected choledocholithiasis Presented with  elevated liver chemistries, trend Repeat CMP and lipase level Initial lipase level was normal Hold off home statin  Elevated troponin, suspect demand ischemia in the setting of sepsis Presented with troponin S 35, uptrending, 68. Repeat troponin S, cycle cardiac enzymes Currently denies any anginal symptoms Twelve-lead EKG no evidence of acute ischemia Continue to monitor on telemetry  Paroxysmal Afib, not on anticoagulation Obtain 12 lead EKG Rate controlled on Toprol XL  Hyperglycemia, possibly stress related Serum glucose 168 Not on hypoglycemic medication at home Obtain A1c and start insulin sliding scale, sensitive  Essential hypertension BP is currently at goal Continue to monitor vital signs  Chronic systolic CHF Euvolemic on exam Continue to closely monitor volume status while on IV fluid in the setting of sepsis. Strict I's and O's and daily weight  GERD/hyperlipidemia Resume home medications except statin due to elevated liver chemistries.  Obesity BMI 30 Recommend weight loss outpatient with lifestyle modification     DVT prophylaxis: Subcu Lovenox daily  Code Status: Full code as stated by the patient himself  Family Communication: None at bedside.  Disposition Plan: Admit to telemetry medical, surgical floor.  Consults called: GI consulted by EDP, general surgery consulted by [facility].  Admission status: Inpatient status.   Status is: Inpatient   Dispo:  Patient From: Home  Planned Disposition: Home  Expected discharge date: 12/01/19  Medically stable for discharge: No, ongoing management of presumed sepsis Secondary to acute cholecystitis, elevated troponin, and hyperglycemia.        Darlin Drop MD [facility] Pager [phone]  If 7PM-7AM, please contact night-coverage [email or web address] Password [facility]  11/28/2019, 7:42 PM

## 2019-11-28 NOTE — Progress Notes (Addendum)
CODE SEPSIS - PHARMACY COMMUNICATION  **Broad Spectrum Antibiotics should be administered within 1 hour of Sepsis diagnosis**  Time Code Sepsis Called/Page Received: 11/20 @ 0646   Antibiotics Ordered: meropenem 1 gm IV Q8H   Time of 1st antibiotic administration: not given as of 11/20 @ 0718, RN note states pt is hard stick and IV team has been consulted. Given at 0754.   Scherrie Gerlach ,PharmD Clinical Pharmacist  11/28/2019  7:17 AM

## 2019-11-28 NOTE — ED Notes (Signed)
[place] bedside.  IV team consult placed d/t pt being a hard stick.

## 2019-11-28 NOTE — ED Notes (Signed)
Called [facility] for transfer spoke to [employer]

## 2019-11-28 NOTE — Plan of Care (Signed)
Transfer from [facility] discussed with Dr. Marisa Severin. Shawn Elliott is a 64-y/o male with pmh of HTN, postop PAF not on anticoagulation, CAD s/p 4v CABG w/ stents,  HFrEF w/ EF 35% in 8/20, s/p AICD obesity, esophagitis who presented with a few days of abdominal pain.  Patient was noted to be afebrile with heart rates currently maintained, respirations 23-30, and all other vital signs currently maintained.  He was labs were significant for WBC 21.2, 5, AST 404, ALT 419, total bilirubin 2.7, troponin 35-> 68, and lactic acid 3.3. Ultrasound showed dilatation of the common bile duct and cholelithiasis concerning for choledocholithiasis or possibly cholangitis.  Sepsis protocol has been initiated and patient had been given full fluid bolus (3.5 L lactated Ringer's) with empiric antibiotics of meropenem.  Dr.  Ewing Schlein of gastroenterology had been formally consulted due to the need of ERCP and accepted to see the patient in consultation and transfer to [facility].  The ED provider was contacted in regards to the amount of fluids that the patient had received to keep a monitor on him as he may require some Lasix IV if showing any signs of fluid overload from the amount of fluids given.  Recommended hold off on any additional fluids at this time.

## 2019-11-28 NOTE — ED Triage Notes (Signed)
Patient reports having epigastric pain since 7 pm with vomiting.

## 2019-11-28 NOTE — ED Notes (Signed)
Checked with [id number] waiting for bed assignment

## 2019-11-29 ENCOUNTER — Inpatient Hospital Stay (HOSPITAL_COMMUNITY): Payer: Managed Care, Other (non HMO)

## 2019-11-29 ENCOUNTER — Encounter (HOSPITAL_COMMUNITY): Payer: Self-pay | Admitting: Internal Medicine

## 2019-11-29 ENCOUNTER — Other Ambulatory Visit: Payer: Self-pay

## 2019-11-29 DIAGNOSIS — K8020 Calculus of gallbladder without cholecystitis without obstruction: Secondary | ICD-10-CM | POA: Diagnosis present

## 2019-11-29 DIAGNOSIS — I5022 Chronic systolic (congestive) heart failure: Secondary | ICD-10-CM | POA: Diagnosis not present

## 2019-11-29 DIAGNOSIS — R1011 Right upper quadrant pain: Secondary | ICD-10-CM | POA: Diagnosis not present

## 2019-11-29 DIAGNOSIS — I340 Nonrheumatic mitral (valve) insufficiency: Secondary | ICD-10-CM | POA: Diagnosis not present

## 2019-11-29 DIAGNOSIS — I248 Other forms of acute ischemic heart disease: Secondary | ICD-10-CM

## 2019-11-29 DIAGNOSIS — R748 Abnormal levels of other serum enzymes: Secondary | ICD-10-CM | POA: Diagnosis present

## 2019-11-29 LAB — HIV ANTIBODY (ROUTINE TESTING W REFLEX): HIV Screen 4th Generation wRfx: NONREACTIVE

## 2019-11-29 LAB — CBC WITH DIFFERENTIAL/PLATELET
Abs Immature Granulocytes: 0.07 10*3/uL (ref 0.00–0.07)
Basophils Absolute: 0.1 10*3/uL (ref 0.0–0.1)
Basophils Relative: 0 %
Eosinophils Absolute: 0.1 10*3/uL (ref 0.0–0.5)
Eosinophils Relative: 1 %
HCT: 38 % — ABNORMAL LOW (ref 39.0–52.0)
Hemoglobin: 13 g/dL (ref 13.0–17.0)
Immature Granulocytes: 1 %
Lymphocytes Relative: 6 %
Lymphs Abs: 1 10*3/uL (ref 0.7–4.0)
MCH: 31.6 pg (ref 26.0–34.0)
MCHC: 34.2 g/dL (ref 30.0–36.0)
MCV: 92.5 fL (ref 80.0–100.0)
Monocytes Absolute: 1.4 10*3/uL — ABNORMAL HIGH (ref 0.1–1.0)
Monocytes Relative: 9 %
Neutro Abs: 12.5 10*3/uL — ABNORMAL HIGH (ref 1.7–7.7)
Neutrophils Relative %: 83 %
Platelets: 152 10*3/uL (ref 150–400)
RBC: 4.11 MIL/uL — ABNORMAL LOW (ref 4.22–5.81)
RDW: 13.2 % (ref 11.5–15.5)
WBC: 15 10*3/uL — ABNORMAL HIGH (ref 4.0–10.5)
nRBC: 0 % (ref 0.0–0.2)

## 2019-11-29 LAB — BLOOD CULTURE ID PANEL (REFLEXED) - BCID2

## 2019-11-29 LAB — COMPREHENSIVE METABOLIC PANEL
ALT: 390 U/L — ABNORMAL HIGH (ref 0–44)
AST: 258 U/L — ABNORMAL HIGH (ref 15–41)
Albumin: 3.1 g/dL — ABNORMAL LOW (ref 3.5–5.0)
Alkaline Phosphatase: 131 U/L — ABNORMAL HIGH (ref 38–126)
Anion gap: 8 (ref 5–15)
BUN: 17 mg/dL (ref 8–23)
CO2: 23 mmol/L (ref 22–32)
Calcium: 8.9 mg/dL (ref 8.9–10.3)
Chloride: 106 mmol/L (ref 98–111)
Creatinine, Ser: 1.16 mg/dL (ref 0.61–1.24)
GFR, Estimated: >60 mL/min (ref >60)
Glucose, Bld: 114 mg/dL — ABNORMAL HIGH (ref 70–99)
Potassium: 4.1 mmol/L (ref 3.5–5.1)
Sodium: 137 mmol/L (ref 135–145)
Total Bilirubin: 7.1 mg/dL — ABNORMAL HIGH (ref 0.3–1.2)
Total Protein: 5.4 g/dL — ABNORMAL LOW (ref 6.5–8.1)

## 2019-11-29 LAB — ECHOCARDIOGRAM COMPLETE
Area-P 1/2: 5.66 cm2
Calc EF: 37.1 %
Height: 74 in
S' Lateral: 5.2 cm
Single Plane A2C EF: 37.7 %
Single Plane A4C EF: 31.6 %
Weight: 3840 oz

## 2019-11-29 LAB — GLUCOSE, CAPILLARY
Glucose-Capillary: 106 mg/dL — ABNORMAL HIGH (ref 70–99)
Glucose-Capillary: 114 mg/dL — ABNORMAL HIGH (ref 70–99)
Glucose-Capillary: 116 mg/dL — ABNORMAL HIGH (ref 70–99)
Glucose-Capillary: 99 mg/dL (ref 70–99)

## 2019-11-29 LAB — PROTIME-INR
INR: 1.2 (ref 0.8–1.2)
Prothrombin Time: 14.8 seconds (ref 11.4–15.2)

## 2019-11-29 LAB — URINE CULTURE: Culture: NO GROWTH

## 2019-11-29 LAB — LACTIC ACID, PLASMA: Lactic Acid, Venous: 1 mmol/L (ref 0.5–1.9)

## 2019-11-29 LAB — AMYLASE: Amylase: 23 U/L — ABNORMAL LOW (ref 28–100)

## 2019-11-29 LAB — TROPONIN I (HIGH SENSITIVITY)
Troponin I (High Sensitivity): 2456 ng/L (ref <18)
Troponin I (High Sensitivity): 2173 ng/L (ref <18)

## 2019-11-29 LAB — HEMOGLOBIN A1C
Hgb A1c MFr Bld: 5.4 % (ref 4.8–5.6)
Mean Plasma Glucose: 108.28 mg/dL

## 2019-11-29 LAB — HEPARIN LEVEL (UNFRACTIONATED): Heparin Unfractionated: 0.36 IU/mL (ref 0.30–0.70)

## 2019-11-29 LAB — LIPASE, BLOOD: Lipase: 18 U/L (ref 11–51)

## 2019-11-29 MED ORDER — ATORVASTATIN CALCIUM 80 MG PO TABS: 80.0000 mg | ORAL_TABLET | Freq: Every day | ORAL | Status: DC

## 2019-11-29 MED ORDER — PERFLUTREN LIPID MICROSPHERE
1.0000 mL | INTRAVENOUS | Status: DC | PRN
Start: 2019-11-29 — End: 2019-11-29

## 2019-11-29 MED ORDER — ASPIRIN 325 MG PO TABS: 325.0000 mg | ORAL_TABLET | Freq: Every day | ORAL | Status: DC

## 2019-11-29 MED ORDER — METOPROLOL TARTRATE 5 MG/5ML IV SOLN
5.0000 mg | INTRAVENOUS | Status: DC | PRN
  Administered 2019-11-29 (×2): 5 mg via INTRAVENOUS
  Filled 2019-11-29: qty 5

## 2019-11-29 MED ORDER — PERFLUTREN LIPID MICROSPHERE
1.0000 mL | INTRAVENOUS | Status: AC | PRN
  Administered 2019-11-29: 2 mL via INTRAVENOUS
  Filled 2019-11-29: qty 10

## 2019-11-29 MED ORDER — SPIRONOLACTONE 25 MG PO TABS
25.0000 mg | ORAL_TABLET | Freq: Every day | ORAL | Status: DC
  Administered 2019-11-29 – 2019-12-01 (×3): 25 mg via ORAL
  Filled 2019-11-29 (×3): qty 1

## 2019-11-29 MED ORDER — HEPARIN (PORCINE) 25000 UT/250ML-% IV SOLN
1200.0000 [IU]/h | INTRAVENOUS | Status: DC
  Administered 2019-11-30: 1200 [IU]/h via INTRAVENOUS
  Filled 2019-11-29 (×2): qty 250

## 2019-11-29 MED ORDER — HEPARIN BOLUS VIA INFUSION
4000.0000 [IU] | Freq: Once | INTRAVENOUS | Status: AC
  Administered 2019-11-29: 4000 [IU] via INTRAVENOUS
  Filled 2019-11-29: qty 4000

## 2019-11-29 MED ORDER — ASPIRIN 325 MG PO TABS
325.0000 mg | ORAL_TABLET | Freq: Once | ORAL | Status: AC
  Administered 2019-11-29: 325 mg via ORAL
  Filled 2019-11-29: qty 1

## 2019-11-29 MED ORDER — METOPROLOL SUCCINATE ER 100 MG PO TB24
100.0000 mg | ORAL_TABLET | Freq: Every day | ORAL | Status: DC
  Administered 2019-11-29 – 2019-12-01 (×3): 100 mg via ORAL
  Filled 2019-11-29 (×3): qty 1

## 2019-11-29 NOTE — Progress Notes (Signed)
  Echocardiogram 2D Echocardiogram has been performed.  Shawn Elliott 11/29/2019, 10:13 AM

## 2019-11-29 NOTE — Progress Notes (Signed)
CRITICAL VALUE ALERT  Critical Value:  Troponin level 2456  Date & Time Notied:  11/29/2019 0450  Provider Notified: Dr. Margo Aye on unit  Orders Received/Actions taken: STAT EKG

## 2019-11-29 NOTE — Progress Notes (Addendum)
Progress Note    STELLA HASENSTAB  KXF:[id number] DOB: 07/19/1955  DOA: 11/28/2019 PCP: Manfred Arch, MD      Brief Narrative:    Medical records reviewed and are as summarized below:  GURSHAAN SNELLEN is a 64 y.o. male       Assessment/Plan:   Active Problems:   RUQ abdominal pain   Demand ischemia (HCC)   Body mass index is 30.81 kg/m.  (Obesity)   Cholelithiasis, elevated liver enzymes, dilated CBD,? CBD stone: No clear evidence of cholecystitis at this time.  Continue empiric IV antibiotics pending further work-up.  MRCP is pending.  Plan for ERCP tomorrow.  Monitor liver enzymes.  Follow-up with gastroenterologist and general surgeon.  CAD with previous STEMI s/p coronary stent in 01/2016, s/p CABG in 07/2012; elevated troponin likely due to demand ischemia: He is on IV heparin infusion per cardiologist, this can be discontinued for any surgical procedures.  Continue metoprolol.  Chronic systolic dysfunction: Compensated.  Continue Aldactone.  Lisinopril is on hold.  2D echo November 29, 2019 showed EF estimated at 35 to 40% compared to 2D echo in 01/2016 with EF of 40 to 45%.   Other comorbidities include hypertension, GERD, hyperlipidemia, paroxysmal atrial fibrillation   Diet Order            Diet NPO time specified Except for: Sips with Meds  Diet effective midnight           Diet clear liquid Room service appropriate? Yes; Fluid consistency: Thin  Diet effective now                    Consultants:  Cardiologist  General surgeon  Gastroenterologist  Procedures:  None    Medications:   . atorvastatin  80 mg Oral QHS  . ezetimibe  10 mg Oral QHS  . metoprolol succinate  12.5 mg Oral Daily  . multivitamin with minerals  1 tablet Oral Daily  . omega-3 acid ethyl esters  2 g Oral BID  . pantoprazole  40 mg Oral Daily  . senna-docusate  2 tablet Oral QHS  . sodium chloride flush  3 mL Intravenous Q12H  . spironolactone   25 mg Oral Daily   Continuous Infusions: . sodium chloride    . heparin 1,200 Units/hr (11/29/19 0631)  . lactated ringers 50 mL/hr at 11/29/19 1108  . meropenem (MERREM) IV 1 g (11/29/19 1401)     Anti-infectives (From admission, onward)   Start     Dose/Rate Route Frequency Ordered Stop   11/28/19 2200  metroNIDAZOLE (FLAGYL) tablet 500 mg  Status:  Discontinued        500 mg Oral Every 8 hours 11/28/19 1957 11/28/19 2013   11/28/19 2200  ceFEPIme (MAXIPIME) 2 g in sodium chloride 0.9 % 100 mL IVPB  Status:  Discontinued        2 g 200 mL/hr over 30 Minutes Intravenous Every 8 hours 11/28/19 1957 11/28/19 2013   11/28/19 2200  meropenem (MERREM) 1 g in sodium chloride 0.9 % 100 mL IVPB        1 g 200 mL/hr over 30 Minutes Intravenous Every 8 hours 11/28/19 2013               Family Communication/Anticipated D/C date and plan/Code Status   DVT prophylaxis: SCDs Start: 11/28/19 1933     Code Status: Full Code  Family Communication: Plan discussed with his wife at the bedside Disposition Plan:  Status is: Inpatient  Remains inpatient appropriate because:Ongoing diagnostic testing needed not appropriate for outpatient work up and Inpatient level of care appropriate due to severity of illness   Dispo:  Patient From: Home  Planned Disposition: Home  Expected discharge date: 12/01/19  Medically stable for discharge: No            Subjective:   No abdominal pain, chest pain or shortness of breath.  Objective:    Vitals:   11/29/19 1012 11/29/19 1048 11/29/19 1326 11/29/19 1425  BP: (!) 120/57 126/62 127/68   Pulse: 68 68 67   Resp: 18  17   Temp: 98 F (36.7 C)  98.9 F (37.2 C)   TempSrc: Oral  Oral   SpO2: 97%  98%   Weight:    108.9 kg  Height:    6\' 2"  (1.88 m)   No data found.   Intake/Output Summary (Last 24 hours) at 11/29/2019 1514 Last data filed at 11/29/2019 1327 Gross per 24 hour  Intake 1222.96 ml  Output 300 ml  Net  922.96 ml   Filed Weights   11/29/19 1425  Weight: 108.9 kg    Exam:  GEN: NAD SKIN: No rash EYES: EOMI ENT: MMM CV: RRR PULM: CTA B ABD: soft, obese, NT, +BS CNS: AAO x 3, non focal EXT: No edema or tenderness   Data Reviewed:   I have personally reviewed following labs and imaging studies:  Labs: Labs show the following:   Basic Metabolic Panel: Recent Labs  Lab 11/28/19 0525 11/29/19 0318  NA 140 137  K 3.5 4.1  CL 108 106  CO2 22 23  GLUCOSE 168* 114*  BUN 27* 17  CREATININE 1.11 1.16  CALCIUM 8.5* 8.9   GFR Estimated Creatinine Clearance: 85.6 mL/min (by C-G formula based on SCr of 1.16 mg/dL). Liver Function Tests: Recent Labs  Lab 11/28/19 0525 11/29/19 0318  AST 404* 258*  ALT 419* 390*  ALKPHOS 155* 131*  BILITOT 2.7* 7.1*  PROT 7.1 5.4*  ALBUMIN 4.2 3.1*   Recent Labs  Lab 11/28/19 0525 11/29/19 0318  LIPASE 42 18  AMYLASE  --  23*   No results for input(s): AMMONIA in the last 168 hours. Coagulation profile Recent Labs  Lab 11/28/19 0740 11/29/19 0318  INR 1.1 1.2    CBC: Recent Labs  Lab 11/28/19 0525 11/29/19 0318  WBC 21.2* 15.0*  NEUTROABS 19.4* 12.5*  HGB 15.7 13.0  HCT 46.6 38.0*  MCV 92.6 92.5  PLT 232 152   Cardiac Enzymes: No results for input(s): CKTOTAL, CKMB, CKMBINDEX, TROPONINI in the last 168 hours. BNP (last 3 results) No results for input(s): PROBNP in the last 8760 hours. CBG: Recent Labs  Lab 11/28/19 2044 11/29/19 0013 11/29/19 0430 11/29/19 0759 11/29/19 1245  GLUCAP 98 116* 114* 99 106*   D-Dimer: No results for input(s): DDIMER in the last 72 hours. Hgb A1c: Recent Labs    11/29/19 0318  HGBA1C 5.4   Lipid Profile: No results for input(s): CHOL, HDL, LDLCALC, TRIG, CHOLHDL, LDLDIRECT in the last 72 hours. Thyroid function studies: No results for input(s): TSH, T4TOTAL, T3FREE, THYROIDAB in the last 72 hours.  Invalid input(s): FREET3 Anemia work up: No results for  input(s): VITAMINB12, FOLATE, FERRITIN, TIBC, IRON, RETICCTPCT in the last 72 hours. Sepsis Labs: Recent Labs  Lab 11/28/19 0525 11/28/19 0740 11/28/19 1126 11/29/19 0318  WBC 21.2*  --   --  15.0*  LATICACIDVEN  --  3.3* 2.5*  1.0    Microbiology Recent Results (from the past 240 hour(s))  Resp Panel by RT-PCR (Flu A&B, Covid) Nasopharyngeal Swab     Status: None   Collection Time: 11/28/19  6:36 AM   Specimen: Nasopharyngeal Swab; Nasopharyngeal(NP) swabs in vial transport medium  Result Value Ref Range Status   SARS Coronavirus 2 by RT PCR NEGATIVE NEGATIVE Final    Comment: (NOTE) SARS-CoV-2 target nucleic acids are NOT DETECTED.  The SARS-CoV-2 RNA is generally detectable in upper respiratory specimens during the acute phase of infection. The lowest concentration of SARS-CoV-2 viral copies this assay can detect is 138 copies/mL. A negative result does not preclude SARS-Cov-2 infection and should not be used as the sole basis for treatment or other patient management decisions. A negative result may occur with  improper specimen collection/handling, submission of specimen other than nasopharyngeal swab, presence of viral mutation(s) within the areas targeted by this assay, and inadequate number of viral copies(<138 copies/mL). A negative result must be combined with clinical observations, patient history, and epidemiological information. The expected result is Negative.  Fact Sheet for Patients:  BloggerCourse.com  Fact Sheet for Healthcare Providers:  SeriousBroker.it  This test is no t yet approved or cleared by the [place] FDA and  has been authorized for detection and/or diagnosis of SARS-CoV-2 by FDA under an Emergency Use Authorization (EUA). This EUA will remain  in effect (meaning this test can be used) for the duration of the COVID-19 declaration under Section 564(b)(1) of the Act, 21 U.S.C.section  360bbb-3(b)(1), unless the authorization is terminated  or revoked sooner.       Influenza A by PCR NEGATIVE NEGATIVE Final   Influenza B by PCR NEGATIVE NEGATIVE Final    Comment: (NOTE) The Xpert Xpress SARS-CoV-2/FLU/RSV plus assay is intended as an aid in the diagnosis of influenza from Nasopharyngeal swab specimens and should not be used as a sole basis for treatment. Nasal washings and aspirates are unacceptable for Xpert Xpress SARS-CoV-2/FLU/RSV testing.  Fact Sheet for Patients: BloggerCourse.com  Fact Sheet for Healthcare Providers: SeriousBroker.it  This test is not yet approved or cleared by the [place] FDA and has been authorized for detection and/or diagnosis of SARS-CoV-2 by FDA under an Emergency Use Authorization (EUA). This EUA will remain in effect (meaning this test can be used) for the duration of the COVID-19 declaration under Section 564(b)(1) of the Act, 21 U.S.C. section 360bbb-3(b)(1), unless the authorization is terminated or revoked.  Performed at [facility], [address], [place], [place] [address]   Urine culture     Status: None   Collection Time: 11/28/19  7:22 AM   Specimen: Urine, Random  Result Value Ref Range Status   Specimen Description   Final    URINE, RANDOM Performed at [facility], [address], [place], [place] [address]    Special Requests   Final    NONE Performed at [facility], [address] [place], [place] [address]    Culture   Final    NO GROWTH Performed at [facility], [address], [place], [place] [address]    Report Status 11/29/2019 FINAL  Final  Blood Culture (routine x 2)     Status: None (Preliminary result)   Collection Time: 11/28/19  7:40 AM   Specimen: BLOOD  Result Value Ref Range Status   Specimen Description BLOOD LA  Final   Special Requests   Final    BOTTLES DRAWN AEROBIC  AND ANAEROBIC Blood  Culture adequate volume   Culture   Final    NO GROWTH < 24 HOURS Performed at [facility], [address] [place], [place] [address]    Report Status PENDING  Incomplete  Blood Culture (routine x 2)     Status: None (Preliminary result)   Collection Time: 11/28/19  7:41 AM   Specimen: BLOOD  Result Value Ref Range Status   Specimen Description BLOOD RAC  Final   Special Requests   Final    BOTTLES DRAWN AEROBIC AND ANAEROBIC Blood Culture adequate volume   Culture   Final    NO GROWTH < 24 HOURS Performed at [facility], [address] [place], [place] [address]    Report Status PENDING  Incomplete    Procedures and diagnostic studies:  DG Chest 2 View  Result Date: 11/28/2019 CLINICAL DATA:  Chest pain. EXAM: CHEST - 2 VIEW COMPARISON:  05/29/2017 and prior radiographs FINDINGS: Cardiomediastinal silhouette is unchanged with evidence of CABG. A LEFT-sided pacemaker/AICD is new since 05/29/2017. Minimal bibasilar scarring again noted. There is no evidence of focal airspace disease, pulmonary edema, suspicious pulmonary nodule/mass, pleural effusion, or pneumothorax. No acute bony abnormalities are identified. IMPRESSION: No evidence of acute cardiopulmonary disease. Electronically Signed   By: Harmon Pier M.D.   On: 11/28/2019 06:51   US Aorta  Result Date: 11/28/2019 CLINICAL DATA:  64 year old male with abdominal pain and nausea. EXAM: ULTRASOUND OF ABDOMINAL AORTA TECHNIQUE: Ultrasound examination of the abdominal aorta and proximal common iliac arteries was performed to evaluate for aneurysm. Additional color and Doppler images of the distal aorta were obtained to document patency. COMPARISON:  None. FINDINGS: Abdominal aortic measurements as follows: Proximal: Not well visualized Mid:  2 cm Distal:  1.8 cm Patent: Yes, peak systolic velocity is 279 cm/s Right common iliac artery: 1.2 cm Left common iliac artery: 1.2 cm Aortic atherosclerotic plaque is  noted. IMPRESSION: 1. No evidence of abdominal aortic aneurysm but proximal aorta not well visualized. 2.  Aortic Atherosclerosis (ICD10-I70.0). Electronically Signed   By: Harmon Pier M.D.   On: 11/28/2019 08:27   ECHOCARDIOGRAM COMPLETE  Result Date: 11/29/2019    ECHOCARDIOGRAM REPORT   Patient Name:   RONNIE DOO Date of Exam: 11/29/2019 Medical Rec #:  [id number]        Height:       74.0 in Accession #:    [id number]       Weight:       240.0 lb Date of Birth:  01-17-1955         BSA:          2.349 m Patient Age:    64 years         BP:           126/69 mmHg Patient Gender: M                HR:           70 bpm. Exam Location:  Inpatient Procedure: 2D Echo, Cardiac Doppler, Color Doppler and Intracardiac            Opacification Agent Indications:    Elevated troponin.  History:        Patient has prior history of Echocardiogram examinations, most                 recent 01/14/2016. CHF, Previous Myocardial Infarction and CAD,  Abnormal ECG, Arrythmias:Atrial Fibrillation;                 Signs/Symptoms:Chest Pain.  Sonographer:    Sheralyn Boatman RDCS Referring Phys: [id number] CAROLE N HALL  Sonographer Comments: Technically difficult study due to poor echo windows. Image acquisition challenging due to patient body habitus. IMPRESSIONS  1. Diffuse hypokinesis worse in the posterior/inferior lateral segments EF similar and moderately reduced compared to echo done 2018. Left ventricular ejection fraction, by estimation, is 35 to 40%. The left ventricle has moderately decreased function. The left ventricle has no regional wall motion abnormalities. The left ventricular internal cavity size was moderately dilated. Left ventricular diastolic parameters were normal.  2. Right ventricular systolic function is normal. The right ventricular size is normal.  3. Left atrial size was mildly dilated.  4. The mitral valve is normal in structure. Mild mitral valve regurgitation. No evidence of mitral stenosis.   5. The aortic valve is tricuspid. Aortic valve regurgitation is not visualized. Mild aortic valve sclerosis is present, with no evidence of aortic valve stenosis.  6. The inferior vena cava is normal in size with greater than 50% respiratory variability, suggesting right atrial pressure of 3 mmHg. FINDINGS  Left Ventricle: Diffuse hypokinesis worse in the posterior/inferior lateral segments EF similar and moderately reduced compared to echo done 2018. Left ventricular ejection fraction, by estimation, is 35 to 40%. The left ventricle has moderately decreased function. The left ventricle has no regional wall motion abnormalities. Definity contrast agent was given IV to delineate the left ventricular endocardial borders. The left ventricular internal cavity size was moderately dilated. There is no left ventricular hypertrophy. Left ventricular diastolic parameters were normal. Right Ventricle: The right ventricular size is normal. No increase in right ventricular wall thickness. Right ventricular systolic function is normal. Left Atrium: Left atrial size was mildly dilated. Right Atrium: Right atrial size was normal in size. Pericardium: There is no evidence of pericardial effusion. Mitral Valve: The mitral valve is normal in structure. Mild mitral valve regurgitation. No evidence of mitral valve stenosis. Tricuspid Valve: The tricuspid valve is normal in structure. Tricuspid valve regurgitation is not demonstrated. No evidence of tricuspid stenosis. Aortic Valve: The aortic valve is tricuspid. Aortic valve regurgitation is not visualized. Mild aortic valve sclerosis is present, with no evidence of aortic valve stenosis. Pulmonic Valve: The pulmonic valve was normal in structure. Pulmonic valve regurgitation is mild. No evidence of pulmonic stenosis. Aorta: The aortic root is normal in size and structure. Venous: The inferior vena cava is normal in size with greater than 50% respiratory variability, suggesting right  atrial pressure of 3 mmHg. IAS/Shunts: No atrial level shunt detected by color flow Doppler.  LEFT VENTRICLE PLAX 2D LVIDd:         6.20 cm      Diastology LVIDs:         5.20 cm      LV e' medial:    6.53 cm/s LV PW:         1.10 cm      LV E/e' medial:  14.5 LV IVS:        1.10 cm      LV e' lateral:   10.10 cm/s LVOT diam:     2.30 cm      LV E/e' lateral: 9.4 LV SV:         78 LV SV Index:   33 LVOT Area:     4.15 cm  LV Volumes (MOD) LV  vol d, MOD A2C: 231.0 ml LV vol d, MOD A4C: 156.5 ml LV vol s, MOD A2C: 144.0 ml LV vol s, MOD A4C: 107.0 ml LV SV MOD A2C:     87.0 ml LV SV MOD A4C:     156.5 ml LV SV MOD BP:      75.1 ml RIGHT VENTRICLE RV S prime:     8.81 cm/s TAPSE (M-mode): 1.2 cm LEFT ATRIUM             Index       RIGHT ATRIUM           Index LA diam:        3.90 cm 1.66 cm/m  RA Area:     14.70 cm LA Vol (A2C):   64.9 ml 27.63 ml/m RA Volume:   35.90 ml  15.29 ml/m LA Vol (A4C):   61.3 ml 26.10 ml/m LA Biplane Vol: 65.2 ml 27.76 ml/m  AORTIC VALVE             PULMONIC VALVE LVOT Vmax:   101.00 cm/s PR End Diast Vel: 1.98 msec LVOT Vmean:  70.000 cm/s LVOT VTI:    0.187 m  AORTA Ao Root diam: 4.00 cm Ao Asc diam:  3.60 cm MITRAL VALVE MV Area (PHT): 5.66 cm    SHUNTS MV Decel Time: 134 msec    Systemic VTI:  0.19 m MV E velocity: 94.60 cm/s  Systemic Diam: 2.30 cm MV A velocity: 47.00 cm/s MV E/A ratio:  2.01 Charlton Haws MD Electronically signed by Charlton Haws MD Signature Date/Time: 11/29/2019/10:25:35 AM    Final    US ABDOMEN LIMITED RUQ (LIVER/GB)  Result Date: 11/28/2019 CLINICAL DATA:  Upper abdominal pain with nausea for 1 day EXAM: ULTRASOUND ABDOMEN LIMITED RIGHT UPPER QUADRANT COMPARISON:  Abdominal CT April 08, 2011 FINDINGS: Gallbladder: Shadowing gallstones.  No wall thickening or focal tenderness. Common bile duct: Diameter: 1 cm.  Where visualized, no filling defect. Liver: Increased echogenicity. No evidence of mass lesion. Left lobe was not well visualized due to  sonographic windows. Portal vein is patent on color Doppler imaging with normal direction of blood flow towards the liver. Other: Right renal cortical thinning where seen. IMPRESSION: 1. Cholelithiasis without findings of acute cholecystitis. 2. Dilated common bile duct. No visible choledocholithiasis, but partially covered. Consider MRCP given the biliary labs. 3. Hepatic steatosis. 4. Right renal cortical thinning. Electronically Signed   By: Marnee Spring M.D.   On: 11/28/2019 08:27               LOS: 1 day   Johnie Stadel  [facility]   Pager on [email or web address]. If 7PM-7AM, please contact night-coverage at [email or web address]     11/29/2019, 3:14 PM

## 2019-11-29 NOTE — Progress Notes (Addendum)
Received notification from cardiology to go ahead and proceed with gallbladder and bile duct work-up and okay to hold heparin for that and I have put a call into the surgical team to discuss but my plan is to schedule him for ERCP tomorrow morning however if liver tests drop significantly and surgery is willing then we could proceed with lap chole and Intra-Op cholangiogram first and I will be on standby if Intra-Op cholangiogram is positive otherwise I will proceed tomorrow as above and I see that a MRCP was ordered but I am not aware he will be able to get in the MRI scanner with his pacemaker however if that is done we will wait on those results to decide on above as well

## 2019-11-29 NOTE — Consult Note (Signed)
Reason for Consult: Questionable CBD stones and cholangitis Referring Physician: Hospital team  Shawn Elliott is an 64 y.o. male.  HPI: Patient seen and examined and hospital computer chart reviewed and he initially was scheduled for a ERCP today at 1:00 however with cardiologist concerns about an MI and him being on heparin will hold off for now and he is asymptomatic but did have increased abdominal pain for a day or 2 without obvious fever or vomiting and he had an ultrasound 3 years ago which supposedly was negative and really did not have much GI issues until a month ago when he had some milder pains which he thought were indigestion and he has had a screening colonoscopy which we discussed and he has no other specific complaints and case discussed with the surgical team as well  Past Medical History:  Diagnosis Date  . Coronary artery disease   . GERD (gastroesophageal reflux disease)   . High cholesterol   . History of kidney stones    "passed them" (01/17/2016)  . Hypertension   . PONV (postoperative nausea and vomiting)    "when I woke up from my hernia I was nauseated"  . STEMI (ST elevation myocardial infarction) (HCC) 01/12/2016    Past Surgical History:  Procedure Laterality Date  . CARDIAC CATHETERIZATION N/A 01/12/2016   Procedure: Left Heart Cath and Coronary Angiography;  Surgeon: Lennette Bihari, MD;  Location: [facility] INVASIVE CV LAB;  Service: Cardiovascular;  Laterality: N/A;  . CARDIAC CATHETERIZATION N/A 01/12/2016   Procedure: Coronary Stent Intervention;  Surgeon: Lennette Bihari, MD;  Location: [facility] INVASIVE CV LAB;  Service: Cardiovascular;  Laterality: N/A;  . CARDIAC CATHETERIZATION N/A 01/17/2016   Procedure: Coronary Stent Intervention;  Surgeon: Lennette Bihari, MD;  Location: [facility] INVASIVE CV LAB;  Service: Cardiovascular;  Laterality: N/A;  . CARDIAC CATHETERIZATION N/A 01/17/2016   Procedure: Coronary Balloon Angioplasty;  Surgeon: Lennette Bihari, MD;  Location: [facility]  INVASIVE CV LAB;  Service: Cardiovascular;  Laterality: N/A;  . COLONOSCOPY  ~ 2008  . CORONARY ANGIOPLASTY WITH STENT PLACEMENT  01/17/2016   "3 01/12/2016; 3 01/17/2016"  . GANGLION CYST EXCISION Right ~ 2016  . INGUINAL HERNIA REPAIR Right ~ 2014  . kidney stones  2010    Family History  Problem Relation Age of Onset  . Heart attack Mother   . Diabetes Mother   . Stroke Mother   . Hypertension Mother   . Hyperlipidemia Mother   . Diabetes Father   . Stroke Father   . Hyperlipidemia Father   . Hypertension Father   . Hyperlipidemia Sister   . Hypertension Sister   . Hypertension Brother   . Hyperlipidemia Brother   . Diabetes Brother     Social History:  reports that he quit smoking about 39 years ago. His smoking use included pipe. He has never used smokeless tobacco. He reports current alcohol use of about 1.0 standard drink of alcohol per week. He reports that he does not use drugs.  Allergies:  Allergies  Allergen Reactions  . Penicillins Hives    Childhood allergic reaction Has patient had a PCN reaction causing immediate rash, facial/tongue/throat swelling, SOB or lightheadedness with hypotension: Yes Has patient had a PCN reaction causing severe rash involving mucus membranes or skin necrosis: No Has patient had a PCN reaction that required hospitalization No Has patient had a PCN reaction occurring within the last 10 years: No If all of the above answers are "NO", then  may proceed with Cephalosporin use.    Medications: I have reviewed the patient's current medications.  Results for orders placed or performed during the hospital encounter of 11/28/19 (from the past 48 hour(s))  Glucose, capillary     Status: None   Collection Time: 11/28/19  8:44 PM  Result Value Ref Range   Glucose-Capillary 98 70 - 99 mg/dL    Comment: Glucose reference range applies only to samples taken after fasting for at least 8 hours.  Glucose, capillary     Status: Abnormal    Collection Time: 11/29/19 12:13 AM  Result Value Ref Range   Glucose-Capillary 116 (H) 70 - 99 mg/dL    Comment: Glucose reference range applies only to samples taken after fasting for at least 8 hours.  Protime-INR     Status: None   Collection Time: 11/29/19  3:18 AM  Result Value Ref Range   Prothrombin Time 14.8 11.4 - 15.2 seconds   INR 1.2 0.8 - 1.2    Comment: (NOTE) INR goal varies based on device and disease states. Performed at [facility], [address], [place], [place] [address]   Comprehensive metabolic panel     Status: Abnormal   Collection Time: 11/29/19  3:18 AM  Result Value Ref Range   Sodium 137 135 - 145 mmol/L   Potassium 4.1 3.5 - 5.1 mmol/L   Chloride 106 98 - 111 mmol/L   CO2 23 22 - 32 mmol/L   Glucose, Bld 114 (H) 70 - 99 mg/dL    Comment: Glucose reference range applies only to samples taken after fasting for at least 8 hours.   BUN 17 8 - 23 mg/dL   Creatinine, Ser 6.07 0.61 - 1.24 mg/dL   Calcium 8.9 8.9 - 37.1 mg/dL   Total Protein 5.4 (L) 6.5 - 8.1 g/dL   Albumin 3.1 (L) 3.5 - 5.0 g/dL   AST 062 (H) 15 - 41 U/L   ALT 390 (H) 0 - 44 U/L   Alkaline Phosphatase 131 (H) 38 - 126 U/L   Total Bilirubin 7.1 (H) 0.3 - 1.2 mg/dL   GFR, Estimated >69 >48 mL/min   Anion gap 8 5 - 15    Comment: Performed at [facility], [address], [place], [place] [address]  CBC with Differential/Platelet     Status: Abnormal   Collection Time: 11/29/19  3:18 AM  Result Value Ref Range   WBC 15.0 (H) 4.0 - 10.5 K/uL   RBC 4.11 (L) 4.22 - 5.81 MIL/uL   Hemoglobin 13.0 13.0 - 17.0 g/dL   HCT 03.5 (L) 39 - 52 %   MCV 92.5 80.0 - 100.0 fL   MCH 31.6 26.0 - 34.0 pg   MCHC 34.2 30.0 - 36.0 g/dL   RDW 00.9 38.1 - 82.9 %   Platelets 152 150 - 400 K/uL   nRBC 0.0 0.0 - 0.2 %   Neutrophils Relative % 83 %   Neutro Abs 12.5 (H) 1.7 - 7.7 K/uL   Lymphocytes Relative 6 %   Lymphs Abs 1.0 0.7 - 4.0 K/uL   Monocytes Relative 9 %   Monocytes Absolute 1.4  (H) 0.1 - 1.0 K/uL   Eosinophils Relative 1 %   Eosinophils Absolute 0.1 0.0 - 0.5 K/uL   Basophils Relative 0 %   Basophils Absolute 0.1 0.0 - 0.1 K/uL   Immature Granulocytes 1 %   Abs Immature Granulocytes 0.07 0.00 - 0.07 K/uL  Comment: Performed at [facility], [address] [place], [place] [address]  Lipase, blood     Status: None   Collection Time: 11/29/19  3:18 AM  Result Value Ref Range   Lipase 18 11 - 51 U/L    Comment: Performed at [facility], [address], [place], [place] [address]  Troponin I (High Sensitivity)     Status: Abnormal   Collection Time: 11/29/19  3:18 AM  Result Value Ref Range   Troponin I (High Sensitivity) 2,456 (HH) <18 ng/L    Comment: CRITICAL RESULT CALLED TO, READ BACK BY AND VERIFIED WITH: Orlie Dakin [facility] 11/29/19 0446 [id number] Performed at [facility], [address] [place], [place] [address]   Hemoglobin A1c     Status: None   Collection Time: 11/29/19  3:18 AM  Result Value Ref Range   Hgb A1c MFr Bld 5.4 4.8 - 5.6 %    Comment: (NOTE) Pre diabetes:          5.7%-6.4%  Diabetes:              >6.4%  Glycemic control for   <7.0% adults with diabetes    Mean Plasma Glucose 108.28 mg/dL    Comment: Performed at [facility], [address] [place], [place] [address]  Amylase     Status: Abnormal   Collection Time: 11/29/19  3:18 AM  Result Value Ref Range   Amylase 23 (L) 28 - 100 U/L    Comment: Performed at [facility], [address], [place], [place] [address]  Lactic acid, plasma     Status: None   Collection Time: 11/29/19  3:18 AM  Result Value Ref Range   Lactic Acid, Venous 1.0 0.5 - 1.9 mmol/L    Comment: Performed at [facility], [address], [place], [place] [address]  Glucose, capillary     Status: Abnormal   Collection Time: 11/29/19  4:30 AM  Result Value Ref Range   Glucose-Capillary 114 (H) 70 - 99 mg/dL    Comment: Glucose reference range applies only to samples  taken after fasting for at least 8 hours.  Glucose, capillary     Status: None   Collection Time: 11/29/19  7:59 AM  Result Value Ref Range   Glucose-Capillary 99 70 - 99 mg/dL    Comment: Glucose reference range applies only to samples taken after fasting for at least 8 hours.    DG Chest 2 View  Result Date: 11/28/2019 CLINICAL DATA:  Chest pain. EXAM: CHEST - 2 VIEW COMPARISON:  05/29/2017 and prior radiographs FINDINGS: Cardiomediastinal silhouette is unchanged with evidence of CABG. A LEFT-sided pacemaker/AICD is new since 05/29/2017. Minimal bibasilar scarring again noted. There is no evidence of focal airspace disease, pulmonary edema, suspicious pulmonary nodule/mass, pleural effusion, or pneumothorax. No acute bony abnormalities are identified. IMPRESSION: No evidence of acute cardiopulmonary disease. Electronically Signed   By: Harmon Pier M.D.   On: 11/28/2019 06:51   US Aorta  Result Date: 11/28/2019 CLINICAL DATA:  64 year old male with abdominal pain and nausea. EXAM: ULTRASOUND OF ABDOMINAL AORTA TECHNIQUE: Ultrasound examination of the abdominal aorta and proximal common iliac arteries was performed to evaluate for aneurysm. Additional color and Doppler images of the distal aorta were obtained to document patency. COMPARISON:  None. FINDINGS: Abdominal aortic measurements as follows: Proximal: Not well visualized Mid:  2 cm Distal:  1.8 cm Patent: Yes, peak systolic velocity is  279 cm/s Right common iliac artery: 1.2 cm Left common iliac artery: 1.2 cm Aortic atherosclerotic plaque is noted. IMPRESSION: 1. No evidence of abdominal aortic aneurysm but proximal aorta not well visualized. 2.  Aortic Atherosclerosis (ICD10-I70.0). Electronically Signed   By: Harmon Pier M.D.   On: 11/28/2019 08:27   US ABDOMEN LIMITED RUQ (LIVER/GB)  Result Date: 11/28/2019 CLINICAL DATA:  Upper abdominal pain with nausea for 1 day EXAM: ULTRASOUND ABDOMEN LIMITED RIGHT UPPER QUADRANT COMPARISON:   Abdominal CT April 08, 2011 FINDINGS: Gallbladder: Shadowing gallstones.  No wall thickening or focal tenderness. Common bile duct: Diameter: 1 cm.  Where visualized, no filling defect. Liver: Increased echogenicity. No evidence of mass lesion. Left lobe was not well visualized due to sonographic windows. Portal vein is patent on color Doppler imaging with normal direction of blood flow towards the liver. Other: Right renal cortical thinning where seen. IMPRESSION: 1. Cholelithiasis without findings of acute cholecystitis. 2. Dilated common bile duct. No visible choledocholithiasis, but partially covered. Consider MRCP given the biliary labs. 3. Hepatic steatosis. 4. Right renal cortical thinning. Electronically Signed   By: Marnee Spring M.D.   On: 11/28/2019 08:27    Review of Systems negative except above he has been working all week as a driver Blood pressure 147/82, pulse 71, temperature 98.2 F (36.8 C), temperature source Oral, resp. rate 17, SpO2 98 %. Physical Exam Vital signs stable afebrile no acute distress lungs are clear regular rate and rhythm abdomen is soft nontender labs and ultrasound reviewed Assessment/Plan: Gallstones and possible CBD stones versus passed CBD stones Plan: The risk benefits methods and success rate of ERCP was discussed with the patient and I am happy to proceed at the appropriate time but will wait on cardiology work-up and will allow clear liquids if they have no plans for procedures today and if he continues to improve and liver tests decrease surgery will consider lap chole with Intra-Op cholangiogram and since he cannot get in the MRI scanner could consider CT scan to better evaluate both gallbladder and duct system although seeing CBD stones when present is not very specific and could also consider EUS to evaluate the duct although may be difficult to schedule on this holiday week but I am available for ERCP at the appropriate time and will check on  tomorrow  [facility] E 11/29/2019, 9:21 AM

## 2019-11-29 NOTE — Progress Notes (Signed)
Patient had a significant rise in his Troponin S this AM from 68 to >2400.  He denies any chest pain or dyspnea at rest.  Ordered stat 12 lead EKG and stat trop S x 3.  1 dose of ASA 325 mg ordered to be given.   Discussed with cardiology fellow, Dr. Rennis Harding, who recommended to start heparin drip.  Patient has a hx of paroxysmal a-fib and currently not on OAC.  He states that he was on blood thinner for 6 months after his CABG then was taken off.  He denies any hx of GI bleed or intracranial bleed.  Heparin drip ordered through pharmacy consult.    Highly appreciate cardiology's assistance.

## 2019-11-29 NOTE — Consult Note (Addendum)
CARDIOLOGY CONSULT NOTE  Patient ID: Shawn Elliott MRN: [id number] DOB/AGE: 1955-10-23 64 y.o.  Admit date: 11/28/2019 Primary Physician Dr. Margo Elliott, [facility] Primary Cardiologist [facility] (Dr. Zollie Elliott) Chief Complaint  Abdominal pain Requesting  Dr. 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   Patient was in his usual state of health on 11/19 at 1900 he experienced RUQ/LUQ abdominal pain, 10/10, non anginal, no association with food, associated with N/V x2 (non-bloody, bilious), he was able to get some rest but had worsening pain 11/20 at 0400 prompting ED visit. Patient endorses similar episode few weeks back lasting 3.5 hrs with resolution. In the ED, patient received Gi cocktail without symptom relief but morphine helped. He had elevated transaminitis ALT>AST, elevated T bili, LA 3.3 > 2.5 -> 1.0, Leukocytosis 15. BC were taken and patient was placed on antibiotics. Abdominal US revealed cholelithiasis without definitive choledocholithasis diagnosis. Surgery was consulted for further workup.   Cardiology is being consulted due to rising troponin 38 -> 68 -> 2456. Patient denies any anginal symptoms. Patient was given ASA 324mg  x1 and heparin gtt was initiated.   EKG:NSR, inferior and lateral q waves suggestive of old infarct  Past Medical History:  Diagnosis Date  . Coronary artery  disease   . GERD (gastroesophageal reflux disease)   . High cholesterol   . History of kidney stones    "passed them" (01/17/2016)  . Hypertension   . PONV (postoperative nausea and vomiting)    "when I woke up from my hernia I was nauseated"  . STEMI (ST elevation myocardial infarction) (HCC) 01/12/2016    Past Surgical History:  Procedure Laterality Date  . CARDIAC CATHETERIZATION N/A 01/12/2016   Procedure: Left Heart Cath and Coronary Angiography;  Surgeon: Shawn Bihari, MD;  Location: [facility] INVASIVE CV LAB;  Service: Cardiovascular;  Laterality: N/A;  . CARDIAC CATHETERIZATION N/A 01/12/2016   Procedure: Coronary Stent Intervention;  Surgeon: Shawn Bihari, MD;  Location: [facility] INVASIVE CV LAB;  Service: Cardiovascular;  Laterality: N/A;  . CARDIAC CATHETERIZATION N/A 01/17/2016   Procedure: Coronary Stent Intervention;  Surgeon: Shawn Bihari, MD;  Location: [facility] INVASIVE CV LAB;  Service: Cardiovascular;  Laterality: N/A;  . CARDIAC CATHETERIZATION N/A 01/17/2016   Procedure: Coronary Balloon Angioplasty;  Surgeon: Shawn Bihari, MD;  Location: [facility] INVASIVE CV LAB;  Service: Cardiovascular;  Laterality: N/A;  . COLONOSCOPY  ~ 2008  . CORONARY ANGIOPLASTY WITH STENT PLACEMENT  01/17/2016   "3 01/12/2016; 3 01/17/2016"  . GANGLION CYST EXCISION Right ~ 2016  . INGUINAL HERNIA REPAIR Right ~ 2014  . kidney stones  2010    Allergies  Allergen Reactions  . Penicillins Hives    Childhood allergic reaction Has patient had a PCN reaction causing immediate rash, facial/tongue/throat swelling, SOB or lightheadedness with hypotension: Yes Has patient had a PCN reaction causing severe rash involving mucus membranes  or skin necrosis: No Has patient had a PCN reaction that required hospitalization No Has patient had a PCN reaction occurring within the last 10 years: No If all of the above answers are "NO", then may proceed with Cephalosporin use.   Medications Prior to Admission  Medication Sig Dispense  Refill Last Dose  . aspirin EC 81 MG tablet Take 81 mg by mouth daily.   11/27/2019 at am  . atorvastatin (LIPITOR) 80 MG tablet Take 1 tablet (80 mg total) by mouth daily at 6 PM. (Patient taking differently: Take 80 mg by mouth at bedtime. ) 90 tablet 3 11/26/2019 at pm  . ezetimibe (ZETIA) 10 MG tablet Take 10 mg by mouth at bedtime.   11/26/2019 at pm  . fluocinonide cream (LIDEX) 0.05 % Apply 1 application topically daily as needed (topical allergic reactions/itching).    few months ago  . lisinopril (PRINIVIL,ZESTRIL) 5 MG tablet Take 5 mg by mouth daily.   11/27/2019 at am  . metoprolol succinate (TOPROL-XL) 100 MG 24 hr tablet Take 100 mg by mouth daily. Take with or immediately following a meal.   11/27/2019 at 8:30am  . Multiple Vitamin (MULTIVITAMIN WITH MINERALS) TABS tablet Take 1 tablet by mouth daily.   11/27/2019 at am  . nitroGLYCERIN (NITROSTAT) 0.4 MG SL tablet Place 1 tablet (0.4 mg total) under the tongue every 5 (five) minutes as needed for chest pain. 25 tablet 2 never  . Omega-3 Fatty Acids (FISH OIL TRIPLE STRENGTH) 1400 MG CAPS Take 1,400 mg by mouth every morning.   11/27/2019 at am  . Omega-3 Fatty Acids (FISH OIL) 1200 MG CAPS Take 1,200 mg by mouth at bedtime.   11/26/2019 at pm  . omeprazole (PRILOSEC) 20 MG capsule Take 20 mg by mouth daily.   11/27/2019 at am  . spironolactone (ALDACTONE) 25 MG tablet Take 25 mg by mouth daily.   11/27/2019 at am   Family History  Problem Relation Age of Onset  . Heart attack Mother   . Diabetes Mother   . Stroke Mother   . Hypertension Mother   . Hyperlipidemia Mother   . Diabetes Father   . Stroke Father   . Hyperlipidemia Father   . Hypertension Father   . Hyperlipidemia Sister   . Hypertension Sister   . Hypertension Brother   . Hyperlipidemia Brother   . Diabetes Brother     Social History   Socioeconomic History  . Marital status: Married    Spouse name: Not on file  . Number of children: Not on file  .  Years of education: Not on file  . Highest education level: Not on file  Occupational History  . Not on file  Tobacco Use  . Smoking status: Former Smoker    Types: Pipe    Quit date: 1982    Years since quitting: 39.9  . Smokeless tobacco: Never Used  Substance and Sexual Activity  . Alcohol use: Yes    Alcohol/week: 1.0 standard drink    Types: 1 Cans of beer per week  . Drug use: No  . Sexual activity: Not Currently  Other Topics Concern  . Not on file  Social History Narrative  . Not on file   Social Determinants of Health   Financial Resource Strain:   . Difficulty of Paying Living Expenses: Not on file  Food Insecurity:   . Worried About Programme researcher, broadcasting/film/video in the Last Year: Not on file  . Ran Out of  Food in the Last Year: Not on file  Transportation Needs:   . Lack of Transportation (Medical): Not on file  . Lack of Transportation (Non-Medical): Not on file  Physical Activity:   . Days of Exercise per Week: Not on file  . Minutes of Exercise per Session: Not on file  Stress:   . Feeling of Stress : Not on file  Social Connections:   . Frequency of Communication with Friends and Family: Not on file  . Frequency of Social Gatherings with Friends and Family: Not on file  . Attends Religious Services: Not on file  . Active Member of Clubs or Organizations: Not on file  . Attends Banker Meetings: Not on file  . Marital Status: Not on file  Intimate Partner Violence:   . Fear of Current or Ex-Partner: Not on file  . Emotionally Abused: Not on file  . Physically Abused: Not on file  . Sexually Abused: Not on file     Review of Systems: [y] = yes, [ ]  = no       General: Weight gain [] ; Weight loss [ ] ; Anorexia [ ] ; Fatigue [ ] ; Fever [ ] ; Chills [ ] ; Weakness [ ]     Cardiac: Chest pain/pressure [ ] ; Resting SOB [ ] ; Exertional SOB [ ] ; Orthopnea [ ] ; Pedal Edema [ ] ; Palpitations [ ] ; Syncope [ ] ; Presyncope [ ] ; Paroxysmal nocturnal dyspnea[ ]      Pulmonary: Cough [ ] ; Wheezing[ ] ; Hemoptysis[ ] ; Sputum [ ] ; Snoring [ ]     GI: Vomiting[ x]; Dysphagia[ ] ; Melena[ ] ; Hematochezia [ ] ; Heartburn[ x]; Abdominal pain [x ]; Constipation [ ] ; Diarrhea [ ] ; BRBPR [ ]     GU: Hematuria[ ] ; Dysuria [ ] ; Nocturia[ ]   Vascular: Pain in legs with walking [ ] ; Pain in feet with lying flat [ ] ; Non-healing sores [ ] ; Stroke [ ] ; TIA [ ] ; Slurred speech [ ] ;    Neuro: Headaches[ ] ; Vertigo[ ] ; Seizures[ ] ; Paresthesias[ ] ;Blurred vision [ ] ; Diplopia [ ] ; Vision changes [ ]     Ortho/Skin: Arthritis [ ] ; Joint pain [ ] ; Muscle pain [ ] ; Joint swelling [ ] ; Back Pain [ ] ; Rash [ ]     Psych: Depression[ ] ; Anxiety[ ]     Heme: Bleeding problems [ ] ; Clotting disorders [ ] ; Anemia [ ]     Endocrine: Diabetes [ ] ; Thyroid dysfunction[ ]   Physical Exam: Blood pressure 113/69, pulse 72, temperature 99.6 F (37.6 C), temperature source Oral, resp. rate 16, SpO2 98 %.   GENERAL: Patient is afebrile, Vital signs reviewed, Well appearing, Patient appears comfortable, Alert and lucid. EYES: Normal inspection. HEENT:  normocephalic, atraumatic , normal ENT inspection. ORAL:  Moist NECK:  supple , normal inspection. CARD:  regular rate and rhythm, heart sounds normal. RESP:  no respiratory distress, breath sounds normal. ABD: soft, tender to palpation, BS present, soft, no organomegaly or masses . BACK: non-tender. No CVA tenderness. MUSC:  normal ROM, non-tender , +1 edema on LLE, no edema RLE SKIN: color normal, no rash, warm, dry . NEURO: awake & alert, lucid, no motor/sensory deficit. Gait stable. PSYCH: mood/affect normal.  Labs: Lab Results  Component Value Date   BUN 17 11/29/2019   Lab Results  Component Value Date   CREATININE 1.16 11/29/2019   Lab Results  Component Value Date   NA 137 11/29/2019   K 4.1 11/29/2019   CL 106 11/29/2019   CO2 23 11/29/2019   Lab  Results  Component Value Date   TROPONINI <0.03 05/29/2017    Lab Results  Component Value Date   WBC 15.0 (H) 11/29/2019   HGB 13.0 11/29/2019   HCT 38.0 (L) 11/29/2019   MCV 92.5 11/29/2019   PLT 152 11/29/2019   Lab Results  Component Value Date   CHOL 111 04/20/2016   HDL 34 (L) 04/20/2016   LDLCALC 56 04/20/2016   TRIG 107 04/20/2016   CHOLHDL 3.3 04/20/2016   Lab Results  Component Value Date   ALT 390 (H) 11/29/2019   AST 258 (H) 11/29/2019   ALKPHOS 131 (H) 11/29/2019   BILITOT 7.1 (H) 11/29/2019      Radiology:   CXR: No evidence of acute cardiopulmonary disease. DC ICD in place  ASSESSMENT/PLAN: Mr. Yankee is a 64 yo CM w/ PMH significant for CAD w/ inferior STEMI s/p PCI w/ DESx1 to [facility] and DESx2 to dRCA extending to area just proximal to PDA, (01/12/2016), 01/17/2016: PTCA DESx1 to mLAD, post dilatation dissection to septal perforator w/ tandem DESz1 to septal perforator, DESx1 to p-mLCX, CAD w/ CABGx4 (07/25/2012) [LIMA -> dLAD, SVG -> D1, SVG -> RI, SVG -> PLB], ischemic HFrEF w/ EF 35% (08/17/18) s/p Medtronic ICD (11/2017), combined systolic/diastolic HF, HTN, HLD, obesity, HTN, post cath A.fib 01/2016 (resolved, not on [facility]), GERD admitted and is being worked up for diagnosis of choledocholithiasis. Cardiology is consulted for rising hsTrop, no anginal pain or EKG changes.   1. Troponemia - rapidly rising delta, but I am suspicious for possible type II MI rather than actual ACS - continue to trend troponin - obtain Echo - ASA 81mg  daily - ok to continue heparin gtt for now - continue Lipitor 80mg  daily, Toprol XL 100mg  daily, lisinopril 5mg  daily  2. CAD -CAD w/ inferior STEMI s/p PCI w/ DESx1 to pRCA and DESx2 to dRCA extending to area just proximal to PDA, (01/12/2016), 01/17/2016: PTCA DESx1 to mLAD, post dilatation dissection to septal perforator w/ tandem DESz1 to septal perforator, DESx1 to p-mLCX, CAD w/ CABGx4 (07/25/2012) [LIMA -> dLAD, SVG -> D1, SVG -> RI, SVG -> PLB] - Management listed above - Pre-op  riskstratification to follow s/p preliminary workup.   3. Ischemic HFrEF w/ EF 35% (08/17/18), combined systolic/diastolic HF - s/p Medtronic ICD (11/2017) - NYHA Class II Stage B - Beta Blocker: continue Toprol XL 100mg  daily - ACE/ARB/ARNI: patient is currently on lisinopril 5mg  daily, consider switching to Entresto in outpt setting if BP tolerates - MRA: Aldactone 25mg  daily - SGLT2 inhibitors: consider Dapagliflozin or Empagliflozin  4. Abdominal Pain - workup pending MRCP  5. Leukocytosis - Infectious w/u pending - abx onboard   Signed: Sharaine Delange 11/29/2019, 5:58 AM

## 2019-11-29 NOTE — Significant Event (Signed)
Cross-coverage note.   Patient was seen for rapid a fib, likely precipitated by his acute illness and not receiving his usual Toprol today. He feels better and rate under better control after IV Lopressor 5 mg. Plan to resume Toprol and use IV Lopressor prn.

## 2019-11-29 NOTE — Progress Notes (Signed)
Patient is not complaining of any chest pain.

## 2019-11-29 NOTE — Progress Notes (Signed)
ANTICOAGULATION CONSULT NOTE - Initial Consult  Pharmacy Consult for Heparin  Indication: chest pain/ACS, elevated troponin  Allergies  Allergen Reactions  . Penicillins Hives    Childhood allergic reaction Has patient had a PCN reaction causing immediate rash, facial/tongue/throat swelling, SOB or lightheadedness with hypotension: Yes Has patient had a PCN reaction causing severe rash involving mucus membranes or skin necrosis: No Has patient had a PCN reaction that required hospitalization No Has patient had a PCN reaction occurring within the last 10 years: No If all of the above answers are "NO", then may proceed with Cephalosporin use.     Vital Signs: Temp: 98.9 F (37.2 C) (11/21 1326) Temp Source: Oral (11/21 1326) BP: 127/68 (11/21 1326) Pulse Rate: 67 (11/21 1326)  Labs: Recent Labs    11/28/19 0525 11/28/19 0525 11/28/19 0740 11/29/19 0318 11/29/19 0858 11/29/19 1401  HGB 15.7  --   --  13.0  --   --   HCT 46.6  --   --  38.0*  --   --   PLT 232  --   --  152  --   --   APTT  --   --  26  --   --   --   LABPROT  --   --  13.6 14.8  --   --   INR  --   --  1.1 1.2  --   --   HEPARINUNFRC  --   --   --   --   --  0.36  CREATININE 1.11  --   --  1.16  --   --   TROPONINIHS 35*   < > 68* 2,456* 2,173*  --    < > = values in this interval not displayed.    Estimated Creatinine Clearance: 85.6 mL/min (by C-G formula based on SCr of 1.16 mg/dL).   Medical History: Past Medical History:  Diagnosis Date  . Coronary artery disease   . GERD (gastroesophageal reflux disease)   . High cholesterol   . History of kidney stones    "passed them" (01/17/2016)  . Hypertension   . PONV (postoperative nausea and vomiting)    "when I woke up from my hernia I was nauseated"  . STEMI (ST elevation myocardial infarction) (HCC) 01/12/2016    Assessment: 64 y/o M with elevated troponin to being heparin. CBC/renal function good. PTA meds reviewed.   Heparin level  therapeutic at 0.36. Noted GI's plan for ERCP tomorrow morning.   Goal of Therapy:  Heparin level 0.3-0.7 units/ml Monitor platelets by anticoagulation protocol: Yes   Plan:  Continue heparin infusion at 1200 units/hr Stop time in for 0400 tomorrow AM F/u anticoag plan post-op  Lulu Riding, PharmD PGY2 Pharmacy Resident Phone between 7 am - 3:30 pm: [phone]  Please check AMION for all [facility] Pharmacy phone numbers After 10:00 PM, call Main Pharmacy [phone]  11/29/2019 2:48 PM

## 2019-11-29 NOTE — Progress Notes (Signed)
Pt came to unit , made comfortable in bed , alert and oriented has ivf infusing with heparin,HR150. Pt c/o  Feeling his heart racing, Dr contacted re same , orders made and commenced Hr down to 84 with a paced rhythm after iv metoprolol.Pt educated re npo status post Mn for possible surgery tomorrow.

## 2019-11-29 NOTE — Progress Notes (Signed)
PHARMACY - PHYSICIAN COMMUNICATION CRITICAL VALUE ALERT - BLOOD CULTURE IDENTIFICATION (BCID)  Shawn Elliott is an 64 y.o. male who presented to [facility] on 11/28/2019 with a chief complaint of cholelithiasis and enlarged bile duct.  Name of physician (or Provider) Contacted: n/a  Current antibiotics: meropenem  Changes to prescribed antibiotics recommended:  Patient is on recommended antibiotics - No changes needed  Will not de-escalate to ceftriaxone given dilated CBD to allow for anaerobic coverage  Results for orders placed or performed during the hospital encounter of 11/28/19  Blood Culture ID Panel (Reflexed) (Collected: 11/28/2019  7:40 AM)  Result Value Ref Range   Enterococcus faecalis NOT DETECTED NOT DETECTED   Enterococcus Faecium NOT DETECTED NOT DETECTED   Listeria monocytogenes NOT DETECTED NOT DETECTED   Staphylococcus species NOT DETECTED NOT DETECTED   Staphylococcus aureus (BCID) NOT DETECTED NOT DETECTED   Staphylococcus epidermidis NOT DETECTED NOT DETECTED   Staphylococcus lugdunensis NOT DETECTED NOT DETECTED   Streptococcus species NOT DETECTED NOT DETECTED   Streptococcus agalactiae NOT DETECTED NOT DETECTED   Streptococcus pneumoniae NOT DETECTED NOT DETECTED   Streptococcus pyogenes NOT DETECTED NOT DETECTED   A.calcoaceticus-baumannii NOT DETECTED NOT DETECTED   Bacteroides fragilis NOT DETECTED NOT DETECTED   Enterobacterales DETECTED (A) NOT DETECTED   Enterobacter cloacae complex NOT DETECTED NOT DETECTED   Escherichia coli NOT DETECTED NOT DETECTED   Klebsiella aerogenes NOT DETECTED NOT DETECTED   Klebsiella oxytoca NOT DETECTED NOT DETECTED   Klebsiella pneumoniae DETECTED (A) NOT DETECTED   Proteus species NOT DETECTED NOT DETECTED   Salmonella species NOT DETECTED NOT DETECTED   Serratia marcescens NOT DETECTED NOT DETECTED   Haemophilus influenzae NOT DETECTED NOT DETECTED   Neisseria meningitidis NOT DETECTED NOT DETECTED    Pseudomonas aeruginosa NOT DETECTED NOT DETECTED   Stenotrophomonas maltophilia NOT DETECTED NOT DETECTED   Candida albicans NOT DETECTED NOT DETECTED   Candida auris NOT DETECTED NOT DETECTED   Candida glabrata NOT DETECTED NOT DETECTED   Candida krusei NOT DETECTED NOT DETECTED   Candida parapsilosis NOT DETECTED NOT DETECTED   Candida tropicalis NOT DETECTED NOT DETECTED   Cryptococcus neoformans/gattii NOT DETECTED NOT DETECTED   CTX-M ESBL NOT DETECTED NOT DETECTED   Carbapenem resistance IMP NOT DETECTED NOT DETECTED   Carbapenem resistance KPC NOT DETECTED NOT DETECTED   Carbapenem resistance NDM NOT DETECTED NOT DETECTED   Carbapenem resist OXA 48 LIKE NOT DETECTED NOT DETECTED   Carbapenem resistance VIM NOT DETECTED NOT DETECTED    Mosetta Anis 11/29/2019  5:50 PM

## 2019-11-29 NOTE — Progress Notes (Signed)
Subjective/Chief Complaint: Pt with elec TPIs On heparin gtt.   Objective: Vital signs in last 24 hours: Temp:  [98.1 F (36.7 C)-99.6 F (37.6 C)] 98.2 F (36.8 C) (11/21 0654) Pulse Rate:  [71-95] 71 (11/21 0654) Resp:  [14-26] 17 (11/21 0654) BP: (113-146)/(56-77) 126/69 (11/21 0654) SpO2:  [91 %-98 %] 98 % (11/21 0654) Last BM Date: 11/26/19  Intake/Output from previous day: 11/20 0701 - 11/21 0700 In: 536.8 [P.O.:240; I.V.:196.9; IV Piggyback:99.9] Out: 300 [Urine:300] Intake/Output this shift: No intake/output data recorded.  PE:  Constitutional: No acute distress, conversant, appears states age, no jaundice Eyes: Anicteric sclerae, moist conjunctiva, no lid lag Lungs: Clear to auscultation bilaterally, normal respiratory effort CV: regular rate and rhythm, no murmurs, no peripheral edema, pedal pulses 2+ GI: Soft, no masses or hepatosplenomegaly, non-tender to palpation Skin: No rashes, palpation reveals normal turgor Psychiatric: appropriate judgment and insight, oriented to person, place, and time   Lab Results:  Recent Labs    11/28/19 0525 11/29/19 0318  WBC 21.2* 15.0*  HGB 15.7 13.0  HCT 46.6 38.0*  PLT 232 152   BMET Recent Labs    11/28/19 0525 11/29/19 0318  NA 140 137  K 3.5 4.1  CL 108 106  CO2 22 23  GLUCOSE 168* 114*  BUN 27* 17  CREATININE 1.11 1.16  CALCIUM 8.5* 8.9       PT/INR Recent Labs    11/28/19 0740 11/29/19 0318  LABPROT 13.6 14.8  INR 1.1 1.2   ABG No results for input(s): PHART, HCO3 in the last 72 hours.  Invalid input(s): PCO2, PO2  Studies/Results: DG Chest 2 View  Result Date: 11/28/2019 CLINICAL DATA:  Chest pain. EXAM: CHEST - 2 VIEW COMPARISON:  05/29/2017 and prior radiographs FINDINGS: Cardiomediastinal silhouette is unchanged with evidence of CABG. A LEFT-sided pacemaker/AICD is new since 05/29/2017. Minimal bibasilar scarring again noted. There is no evidence of focal airspace disease,  pulmonary edema, suspicious pulmonary nodule/mass, pleural effusion, or pneumothorax. No acute bony abnormalities are identified. IMPRESSION: No evidence of acute cardiopulmonary disease. Electronically Signed   By: Harmon Pier M.D.   On: 11/28/2019 06:51   US Aorta  Result Date: 11/28/2019 CLINICAL DATA:  64 year old male with abdominal pain and nausea. EXAM: ULTRASOUND OF ABDOMINAL AORTA TECHNIQUE: Ultrasound examination of the abdominal aorta and proximal common iliac arteries was performed to evaluate for aneurysm. Additional color and Doppler images of the distal aorta were obtained to document patency. COMPARISON:  None. FINDINGS: Abdominal aortic measurements as follows: Proximal: Not well visualized Mid:  2 cm Distal:  1.8 cm Patent: Yes, peak systolic velocity is 279 cm/s Right common iliac artery: 1.2 cm Left common iliac artery: 1.2 cm Aortic atherosclerotic plaque is noted. IMPRESSION: 1. No evidence of abdominal aortic aneurysm but proximal aorta not well visualized. 2.  Aortic Atherosclerosis (ICD10-I70.0). Electronically Signed   By: Harmon Pier M.D.   On: 11/28/2019 08:27   US ABDOMEN LIMITED RUQ (LIVER/GB)  Result Date: 11/28/2019 CLINICAL DATA:  Upper abdominal pain with nausea for 1 day EXAM: ULTRASOUND ABDOMEN LIMITED RIGHT UPPER QUADRANT COMPARISON:  Abdominal CT April 08, 2011 FINDINGS: Gallbladder: Shadowing gallstones.  No wall thickening or focal tenderness. Common bile duct: Diameter: 1 cm.  Where visualized, no filling defect. Liver: Increased echogenicity. No evidence of mass lesion. Left lobe was not well visualized due to sonographic windows. Portal vein is patent on color Doppler imaging with normal direction of blood flow towards the liver. Other: Right renal  cortical thinning where seen. IMPRESSION: 1. Cholelithiasis without findings of acute cholecystitis. 2. Dilated common bile duct. No visible choledocholithiasis, but partially covered. Consider MRCP given the biliary  labs. 3. Hepatic steatosis. 4. Right renal cortical thinning. Electronically Signed   By: Marnee Spring M.D.   On: 11/28/2019 08:27    Anti-infectives: Anti-infectives (From admission, onward)   Start     Dose/Rate Route Frequency Ordered Stop   11/28/19 2200  metroNIDAZOLE (FLAGYL) tablet 500 mg  Status:  Discontinued        500 mg Oral Every 8 hours 11/28/19 1957 11/28/19 2013   11/28/19 2200  ceFEPIme (MAXIPIME) 2 g in sodium chloride 0.9 % 100 mL IVPB  Status:  Discontinued        2 g 200 mL/hr over 30 Minutes Intravenous Every 8 hours 11/28/19 1957 11/28/19 2013   11/28/19 2200  meropenem (MERREM) 1 g in sodium chloride 0.9 % 100 mL IVPB        1 g 200 mL/hr over 30 Minutes Intravenous Every 8 hours 11/28/19 2013        Assessment/Plan: Assessment/Plan: 64M with enlarged common bile duct and cholelithiasis Elev LFTs -May be transient.  S/w Dr. Ewing Schlein.  Will await to see what Cards recommendations are prior to planning any ERCP at this time.  Pt asx currently Elev tropnins -heparin gtt -f/u cards rec  Pt likely to need lap chole this admission based on cards recs   LOS: 1 day    Axel Filler 11/29/2019

## 2019-11-29 NOTE — Progress Notes (Signed)
ANTICOAGULATION CONSULT NOTE - Initial Consult  Pharmacy Consult for Heparin  Indication: chest pain/ACS, elevated troponin  Allergies  Allergen Reactions  . Penicillins Hives    Childhood allergic reaction Has patient had a PCN reaction causing immediate rash, facial/tongue/throat swelling, SOB or lightheadedness with hypotension: Yes Has patient had a PCN reaction causing severe rash involving mucus membranes or skin necrosis: No Has patient had a PCN reaction that required hospitalization No Has patient had a PCN reaction occurring within the last 10 years: No If all of the above answers are "NO", then may proceed with Cephalosporin use.     Vital Signs: Temp: 99.6 F (37.6 C) (11/21 0246) Temp Source: Oral (11/21 0246) BP: 113/69 (11/21 0246) Pulse Rate: 72 (11/21 0246)  Labs: Recent Labs    11/28/19 0525 11/28/19 0740 11/29/19 0318  HGB 15.7  --  13.0  HCT 46.6  --  38.0*  PLT 232  --  152  APTT  --  26  --   LABPROT  --  13.6 14.8  INR  --  1.1 1.2  CREATININE 1.11  --  1.16  TROPONINIHS 35* 68* 2,456*    Estimated Creatinine Clearance: 85.6 mL/min (by C-G formula based on SCr of 1.16 mg/dL).   Medical History: Past Medical History:  Diagnosis Date  . Coronary artery disease   . GERD (gastroesophageal reflux disease)   . High cholesterol   . History of kidney stones    "passed them" (01/17/2016)  . Hypertension   . PONV (postoperative nausea and vomiting)    "when I woke up from my hernia I was nauseated"  . STEMI (ST elevation myocardial infarction) (HCC) 01/12/2016    Assessment: 64 y/o M with elevated troponin to being heparin. CBC/renal function good. PTA meds reviewed.   Goal of Therapy:  Heparin level 0.3-0.7 units/ml Monitor platelets by anticoagulation protocol: Yes   Plan:  Heparin 4000 units BOLUS Start heparin drip at 1200 units/hr 1400 HL Daily CBC/HL Monitor for bleeding  Abran Duke, PharmD, BCPS Clinical Pharmacist Phone:  [phone]

## 2019-11-30 ENCOUNTER — Encounter (HOSPITAL_COMMUNITY): Payer: Self-pay | Admitting: Internal Medicine

## 2019-11-30 ENCOUNTER — Inpatient Hospital Stay (HOSPITAL_COMMUNITY): Payer: Managed Care, Other (non HMO) | Admitting: Anesthesiology

## 2019-11-30 ENCOUNTER — Encounter (HOSPITAL_COMMUNITY)
Admission: AD | Disposition: A | Discharge status: Home / Self Care | Payer: Self-pay | Source: Other Acute Inpatient Hospital | Attending: Internal Medicine

## 2019-11-30 DIAGNOSIS — R1011 Right upper quadrant pain: Secondary | ICD-10-CM | POA: Diagnosis not present

## 2019-11-30 DIAGNOSIS — I480 Paroxysmal atrial fibrillation: Secondary | ICD-10-CM | POA: Diagnosis not present

## 2019-11-30 DIAGNOSIS — R748 Abnormal levels of other serum enzymes: Secondary | ICD-10-CM

## 2019-11-30 DIAGNOSIS — I248 Other forms of acute ischemic heart disease: Secondary | ICD-10-CM | POA: Diagnosis not present

## 2019-11-30 DIAGNOSIS — K8001 Calculus of gallbladder with acute cholecystitis with obstruction: Secondary | ICD-10-CM | POA: Diagnosis not present

## 2019-11-30 HISTORY — PX: CHOLECYSTECTOMY: SHX55

## 2019-11-30 LAB — CBC WITH DIFFERENTIAL/PLATELET
Abs Immature Granulocytes: 0.05 10*3/uL (ref 0.00–0.07)
Basophils Absolute: 0.1 10*3/uL (ref 0.0–0.1)
Basophils Relative: 1 %
Eosinophils Absolute: 0.3 10*3/uL (ref 0.0–0.5)
Eosinophils Relative: 3 %
HCT: 42.3 % (ref 39.0–52.0)
Hemoglobin: 14.5 g/dL (ref 13.0–17.0)
Immature Granulocytes: 1 %
Lymphocytes Relative: 11 %
Lymphs Abs: 1.1 10*3/uL (ref 0.7–4.0)
MCH: 31.5 pg (ref 26.0–34.0)
MCHC: 34.3 g/dL (ref 30.0–36.0)
MCV: 92 fL (ref 80.0–100.0)
Monocytes Absolute: 1.1 10*3/uL — ABNORMAL HIGH (ref 0.1–1.0)
Monocytes Relative: 11 %
Neutro Abs: 7.2 10*3/uL (ref 1.7–7.7)
Neutrophils Relative %: 73 %
Platelets: 158 10*3/uL (ref 150–400)
RBC: 4.6 MIL/uL (ref 4.22–5.81)
RDW: 13 % (ref 11.5–15.5)
WBC: 9.7 10*3/uL (ref 4.0–10.5)
nRBC: 0 % (ref 0.0–0.2)

## 2019-11-30 LAB — COMPREHENSIVE METABOLIC PANEL
ALT: 294 U/L — ABNORMAL HIGH (ref 0–44)
AST: 149 U/L — ABNORMAL HIGH (ref 15–41)
Albumin: 3.1 g/dL — ABNORMAL LOW (ref 3.5–5.0)
Alkaline Phosphatase: 146 U/L — ABNORMAL HIGH (ref 38–126)
Anion gap: 11 (ref 5–15)
BUN: 11 mg/dL (ref 8–23)
CO2: 22 mmol/L (ref 22–32)
Calcium: 9 mg/dL (ref 8.9–10.3)
Chloride: 106 mmol/L (ref 98–111)
Creatinine, Ser: 1.14 mg/dL (ref 0.61–1.24)
GFR, Estimated: >60 mL/min (ref >60)
Glucose, Bld: 119 mg/dL — ABNORMAL HIGH (ref 70–99)
Potassium: 4.6 mmol/L (ref 3.5–5.1)
Sodium: 139 mmol/L (ref 135–145)
Total Bilirubin: 3.1 mg/dL — ABNORMAL HIGH (ref 0.3–1.2)
Total Protein: 6.2 g/dL — ABNORMAL LOW (ref 6.5–8.1)

## 2019-11-30 LAB — SURGICAL PCR SCREEN
MRSA, PCR: NEGATIVE
Staphylococcus aureus: NEGATIVE

## 2019-11-30 SURGERY — ERCP, WITH INTERVENTION IF INDICATED
Anesthesia: General

## 2019-11-30 SURGERY — LAPAROSCOPIC CHOLECYSTECTOMY WITH INTRAOPERATIVE CHOLANGIOGRAM
Anesthesia: General | Site: Abdomen

## 2019-11-30 MED ORDER — FENTANYL CITRATE (PF) 250 MCG/5ML IJ SOLN
INTRAMUSCULAR | Status: AC
  Filled 2019-11-30: qty 5

## 2019-11-30 MED ORDER — SUGAMMADEX SODIUM 200 MG/2ML IV SOLN
INTRAVENOUS | Status: DC | PRN
  Administered 2019-11-30: 200 mg via INTRAVENOUS

## 2019-11-30 MED ORDER — IBUPROFEN 600 MG PO TABS
600.0000 mg | ORAL_TABLET | Freq: Four times a day (QID) | ORAL | Status: DC | PRN
  Administered 2019-11-30: 600 mg via ORAL
  Filled 2019-11-30: qty 1
  Filled 2019-11-30: qty 3
  Filled 2019-11-30: qty 1

## 2019-11-30 MED ORDER — SODIUM CHLORIDE 0.9 % IV SOLN
INTRAVENOUS | Status: DC | PRN
  Administered 2019-11-30: .1 mL

## 2019-11-30 MED ORDER — DEXMEDETOMIDINE (PRECEDEX) IN NS 20 MCG/5ML (4 MCG/ML) IV SYRINGE
PREFILLED_SYRINGE | INTRAVENOUS | Status: AC
  Filled 2019-11-30: qty 5

## 2019-11-30 MED ORDER — LACTATED RINGERS IV SOLN: INTRAVENOUS | Status: DC

## 2019-11-30 MED ORDER — PROPOFOL 10 MG/ML IV BOLUS
INTRAVENOUS | Status: AC
  Filled 2019-11-30: qty 20

## 2019-11-30 MED ORDER — BUPIVACAINE HCL (PF) 0.25 % IJ SOLN
INTRAMUSCULAR | Status: AC
  Filled 2019-11-30: qty 30

## 2019-11-30 MED ORDER — DEXAMETHASONE SODIUM PHOSPHATE 10 MG/ML IJ SOLN
INTRAMUSCULAR | Status: AC
  Filled 2019-11-30: qty 1

## 2019-11-30 MED ORDER — ROCURONIUM BROMIDE 10 MG/ML (PF) SYRINGE
PREFILLED_SYRINGE | INTRAVENOUS | Status: DC | PRN
  Administered 2019-11-30: 10 mg via INTRAVENOUS
  Administered 2019-11-30: 70 mg via INTRAVENOUS
  Administered 2019-11-30: 20 mg via INTRAVENOUS

## 2019-11-30 MED ORDER — ROCURONIUM BROMIDE 10 MG/ML (PF) SYRINGE
PREFILLED_SYRINGE | INTRAVENOUS | Status: AC
  Filled 2019-11-30: qty 20

## 2019-11-30 MED ORDER — BUPIVACAINE HCL (PF) 0.25 % IJ SOLN
INTRAMUSCULAR | Status: DC | PRN
  Administered 2019-11-30: 10 mL

## 2019-11-30 MED ORDER — 0.9 % SODIUM CHLORIDE (POUR BTL) OPTIME
TOPICAL | Status: DC | PRN
  Administered 2019-11-30: 1000 mL

## 2019-11-30 MED ORDER — ASPIRIN EC 81 MG PO TBEC
81.0000 mg | DELAYED_RELEASE_TABLET | Freq: Every day | ORAL | Status: DC
  Administered 2019-12-01: 81 mg via ORAL
  Filled 2019-11-30: qty 1

## 2019-11-30 MED ORDER — LIDOCAINE 2% (20 MG/ML) 5 ML SYRINGE
INTRAMUSCULAR | Status: DC | PRN
  Administered 2019-11-30: 60 mg via INTRAVENOUS

## 2019-11-30 MED ORDER — CHLORHEXIDINE GLUCONATE 0.12 % MT SOLN: 15.0000 mL | Freq: Once | OROMUCOSAL | Status: AC

## 2019-11-30 MED ORDER — CHLORHEXIDINE GLUCONATE 0.12 % MT SOLN
OROMUCOSAL | Status: AC
  Administered 2019-11-30: 15 mL via OROMUCOSAL
  Filled 2019-11-30: qty 15

## 2019-11-30 MED ORDER — ONDANSETRON HCL 4 MG/2ML IJ SOLN
INTRAMUSCULAR | Status: AC
  Filled 2019-11-30: qty 2

## 2019-11-30 MED ORDER — TRAMADOL HCL 50 MG PO TABS
50.0000 mg | ORAL_TABLET | Freq: Four times a day (QID) | ORAL | Status: DC | PRN
  Administered 2019-11-30 (×2): 50 mg via ORAL
  Filled 2019-11-30 (×2): qty 1

## 2019-11-30 MED ORDER — SODIUM CHLORIDE 0.9 % IR SOLN
Status: DC | PRN
  Administered 2019-11-30: 1000 mL

## 2019-11-30 MED ORDER — MIDAZOLAM HCL 2 MG/2ML IJ SOLN
INTRAMUSCULAR | Status: DC | PRN
  Administered 2019-11-30: 2 mg via INTRAVENOUS

## 2019-11-30 MED ORDER — DEXAMETHASONE SODIUM PHOSPHATE 10 MG/ML IJ SOLN
INTRAMUSCULAR | Status: DC | PRN
  Administered 2019-11-30: 10 mg via INTRAVENOUS

## 2019-11-30 MED ORDER — DEXMEDETOMIDINE (PRECEDEX) IN NS 20 MCG/5ML (4 MCG/ML) IV SYRINGE
PREFILLED_SYRINGE | INTRAVENOUS | Status: DC | PRN
  Administered 2019-11-30 (×2): 4 ug via INTRAVENOUS

## 2019-11-30 MED ORDER — PROPOFOL 10 MG/ML IV BOLUS
INTRAVENOUS | Status: DC | PRN
  Administered 2019-11-30: 140 mg via INTRAVENOUS
  Administered 2019-11-30: 30 mg via INTRAVENOUS

## 2019-11-30 MED ORDER — LIDOCAINE HCL (PF) 2 % IJ SOLN
INTRAMUSCULAR | Status: AC
  Filled 2019-11-30: qty 5

## 2019-11-30 MED ORDER — MIDAZOLAM HCL 2 MG/2ML IJ SOLN
INTRAMUSCULAR | Status: AC
  Filled 2019-11-30: qty 2

## 2019-11-30 MED ORDER — FENTANYL CITRATE (PF) 250 MCG/5ML IJ SOLN
INTRAMUSCULAR | Status: DC | PRN
  Administered 2019-11-30: 100 ug via INTRAVENOUS
  Administered 2019-11-30 (×3): 50 ug via INTRAVENOUS

## 2019-11-30 SURGICAL SUPPLY — 43 items
APPLIER CLIP 5 13 M/L LIGAMAX5 (MISCELLANEOUS) ×3
BLADE CLIPPER SURG (BLADE) IMPLANT
CANISTER SUCT 3000ML PPV (MISCELLANEOUS) ×3 IMPLANT
CHLORAPREP W/TINT 26 (MISCELLANEOUS) ×3 IMPLANT
CLIP APPLIE 5 13 M/L LIGAMAX5 (MISCELLANEOUS) ×1 IMPLANT
CLIP VESOLOCK XL 6/CT (CLIP) ×3 IMPLANT
COVER MAYO STAND STRL (DRAPES) ×3 IMPLANT
COVER SURGICAL LIGHT HANDLE (MISCELLANEOUS) ×3 IMPLANT
COVER WAND RF STERILE (DRAPES) ×3 IMPLANT
DERMABOND ADVANCED (GAUZE/BANDAGES/DRESSINGS) ×2
DERMABOND ADVANCED .7 DNX12 (GAUZE/BANDAGES/DRESSINGS) ×1 IMPLANT
DISSECTOR BLUNT TIP ENDO 5MM (MISCELLANEOUS) IMPLANT
DRAPE C-ARM 42X120 X-RAY (DRAPES) ×3 IMPLANT
ELECT CAUTERY BLADE 6.4 (BLADE) ×3 IMPLANT
ELECT REM PT RETURN 9FT ADLT (ELECTROSURGICAL) ×3
ELECTRODE REM PT RTRN 9FT ADLT (ELECTROSURGICAL) ×1 IMPLANT
GLOVE BIO SURGEON STRL SZ7.5 (GLOVE) ×3 IMPLANT
GLOVE INDICATOR 8.0 STRL GRN (GLOVE) ×3 IMPLANT
GOWN STRL REUS W/ TWL LRG LVL3 (GOWN DISPOSABLE) ×2 IMPLANT
GOWN STRL REUS W/ TWL XL LVL3 (GOWN DISPOSABLE) ×1 IMPLANT
GOWN STRL REUS W/TWL LRG LVL3 (GOWN DISPOSABLE) ×6
GOWN STRL REUS W/TWL XL LVL3 (GOWN DISPOSABLE) ×3
KIT BASIN OR (CUSTOM PROCEDURE TRAY) ×3 IMPLANT
KIT TURNOVER KIT B (KITS) ×3 IMPLANT
L-HOOK LAP DISP 36CM (ELECTROSURGICAL) ×3
LHOOK LAP DISP 36CM (ELECTROSURGICAL) ×1 IMPLANT
NS IRRIG 1000ML POUR BTL (IV SOLUTION) ×3 IMPLANT
PAD ARMBOARD 7.5X6 YLW CONV (MISCELLANEOUS) ×3 IMPLANT
PENCIL SMOKE EVACUATOR (MISCELLANEOUS) ×3 IMPLANT
POUCH SPECIMEN RETRIEVAL 10MM (ENDOMECHANICALS) ×3 IMPLANT
SCISSORS LAP 5X35 DISP (ENDOMECHANICALS) ×3 IMPLANT
SET CHOLANGIOGRAPH 5 50 .035 (SET/KITS/TRAYS/PACK) ×3 IMPLANT
SET IRRIG TUBING LAPAROSCOPIC (IRRIGATION / IRRIGATOR) ×3 IMPLANT
SET TUBE SMOKE EVAC HIGH FLOW (TUBING) ×3 IMPLANT
SLEEVE ENDOPATH XCEL 5M (ENDOMECHANICALS) ×6 IMPLANT
SPECIMEN JAR SMALL (MISCELLANEOUS) ×3 IMPLANT
SUT MNCRL AB 4-0 PS2 18 (SUTURE) ×3 IMPLANT
TOWEL GREEN STERILE (TOWEL DISPOSABLE) ×3 IMPLANT
TOWEL GREEN STERILE FF (TOWEL DISPOSABLE) ×3 IMPLANT
TRAY LAPAROSCOPIC MC (CUSTOM PROCEDURE TRAY) ×3 IMPLANT
TROCAR XCEL BLUNT TIP 100MML (ENDOMECHANICALS) ×3 IMPLANT
TROCAR XCEL NON-BLD 5MMX100MML (ENDOMECHANICALS) ×3 IMPLANT
WATER STERILE IRR 1000ML POUR (IV SOLUTION) ×3 IMPLANT

## 2019-11-30 NOTE — Anesthesia Procedure Notes (Signed)
Procedure Name: Intubation Date/Time: 11/30/2019 9:23 AM Performed by: Dorthea Cove, CRNA Pre-anesthesia Checklist: Patient identified, Emergency Drugs available, Suction available and Patient being monitored Patient Re-evaluated:Patient Re-evaluated prior to induction Oxygen Delivery Method: Circle system utilized Preoxygenation: Pre-oxygenation with 100% oxygen Induction Type: IV induction Ventilation: Mask ventilation without difficulty Laryngoscope Size: Mac and 4 Grade View: Grade II Tube type: Oral Tube size: 7.5 mm Number of attempts: 1 Airway Equipment and Method: Stylet and Oral airway Placement Confirmation: ETT inserted through vocal cords under direct vision,  positive ETCO2 and breath sounds checked- equal and bilateral Secured at: 22 cm Tube secured with: Tape Dental Injury: Teeth and Oropharynx as per pre-operative assessment

## 2019-11-30 NOTE — Progress Notes (Signed)
Shawn Elliott 4:32 PM  Subjective: Patient doing well from his surgery unfortunately no IOC was done and I discussed that with the patient and his wife and discussed the options going forward and we also discussed his previous colonoscopy  Objective: No signs stable afebrile patient not examined today looks well from his surgery white count normal liver tests decreasing  Assessment: Questionable past versus residual CBD stones  Plan: He will think about an MRI and we will wait on tomorrow's liver tests and consider MRCP versus outpatient EUS versus following liver test to normal and waiting for more symptoms before proceeding with ERCP  [facility]  office [phone] After 5PM or if no answer call [phone]

## 2019-11-30 NOTE — Op Note (Signed)
11/28/2019 - 11/30/2019 10:56 AM  PATIENT: Shawn Elliott  64 y.o. male  Patient Care Team: Crowder, Pete Pelt, MD as PCP - General (Family Medicine) Vita Erm, MD as PCP - Cardiology (Internal Medicine)  PRE-OPERATIVE DIAGNOSIS: History of choledocholithiasis  POST-OPERATIVE DIAGNOSIS: Same + acute cholecystitis  PROCEDURE: Laparoscopic cholecystectomy  SURGEON: Stephanie Coup. Bronson Bressman, MD  ASSISTANT: Patrici Ranks, RNFA  ANESTHESIA: General endotracheal  EBL: 25 cc  DRAINS: None  SPECIMEN: Gallbladder  COUNTS: Sponge, needle and instrument counts were reported correct x2 at the conclusion of the operation  DISPOSITION: PACU in satisfactory condition  COMPLICATIONS: None  FINDINGS: Omental adhesions to periumbilical midline at prior port site and encasing the gallbladder. The gallbladder was edematous consistent with acute inflammation. Infundibulum was tearing with minimal traction therefore IOC was not attempted out of concerns for cystic duct avulsion.   DESCRIPTION:  The patient was identified & brought into the operating room. He was then positioned supine on the OR table. SCDs were in place and active during the entire case. He then underwent general endotracheal anesthesia. Pressure points were padded. Hair on the abdomen was clipped by the OR team. The abdomen was prepped and draped in the standard sterile fashion. Antibiotics were administered. A surgical timeout was performed and confirmed our plan.   An infraumbilical incision was made as his prior surgery was a supraumbilical incision. The umbilical stalk was grasped and retracted outwardly. The infraumbilical fascia was identified and incised. The peritoneal cavity was cautiously entered bluntly. A purse-string 0 Vicryl suture was placed. The Hasson cannula was inserted into the peritoneal cavity and insufflation with CO2 commenced to . A laparoscope was inserted into the peritoneal cavity and  inspection confirmed no evidence of trocar site complications. There are supraumbilical omental adhesions. The patient was then positioned in reverse Trendelenburg with slight left side down. 3 additional 73mm trocars were placed along the right subcostal line - one 86mm port in mid subcostal region, another 45mm port in the right flank near the anterior axillary line, and a third 59mm port in the left subxiphoid region obliquely near the falciform ligament. To ensure safe instrument and camera passage, some of the omental adhesions were taken down from the supraumbilical midline.   The liver and gallbladder were inspected. The gallbladder is not initially visible due to omental adhesions covering. These are carefully taken down from the liver with cautery. The gallbladder was ultimately visualized and acutely inflamed in appearance with edematous wall. The gallbladder fundus was grasped and elevated cephalad. An additional grasper was then placed on the infundibulum of the gallbladder and the infundibulum was retracted laterally. Staying high on the gallbladder, the peritoneum on both sides of the gallbladder was opened with hook cautery. Gentle blunt dissection was then employed with a Art gallery manager working down into [employer]. The cystic duct was identified and carefully circumferentially dissected. The cystic artery was also identified and carefully circumferentially dissected. The space between the cystic artery and hepatocystic plate was developed such that a good view of the liver could be seen through a window medial to the cystic artery. The triangle of Calot had been cleared of all fibrofatty tissue. A small tear occurred where the infundibulum grasper was located and sludge + small stones emptied. The gallbladder at this level is very fragile. These were cleared and evacuated with blunt grasper and suction/irrigation. At this point, a critical view of safety was achieved and the only structures  visualized was the skeletonized cystic duct laterally,  the skeletonized cystic artery and the liver through the window medial to the artery. No posterior cystic artery was noted  A cholangiogram was not performed at this point as the gallbladder was quite fragile and concern existed for potential cystic duct avulsion from common hepatic duct.  The cystic duct and artery were clipped with 2 clips on the patient side and 1 clip on the specimen side - the distal most cystic duct clip was a gold hem-o-lok clip. The cystic duct and artery were then divided. The gallbladder was then freed from its remaining attachments to the liver using electrocautery and placed into an endocatch bag. The RUQ was gently irrigated with sterile saline. Hemostasis was then verified. The clips were in good position; the gallbladder fossa was dry. A few 1-2 mm stones were cleared during this process. Additional irrigation ulitilzed to ensure no evident retained stones either. The rest of the abdomen was inspected no injury nor bleeding elsewhere was identified.  The endocatch bag containing the gallbladder was then removed from the umbilical port site and passed off as specimen. The RUQ ports were removed under direct visualization and noted to be hemostatic. The umbilical fascia was then closed using the 0 Vicryl purse-string suture. The fascia was palpated and noted to be completely closed. The skin of all incision sites was approximated with 4-0 monocryl subcuticular suture and dermabond applied. He was then awakened from anesthesia, extubated, and transferred to a stretcher for transport to PACU in satisfactory condition.

## 2019-11-30 NOTE — Progress Notes (Signed)
Progress Note  Day of Surgery  Subjective: Patient denies abdominal pain or nausea. Standing in room and watching heart monitor. Reports HR improved after getting BB this AM. Wife at bedside.   Objective: Vital signs in last 24 hours: Temp:  [97.5 F (36.4 C)-100.2 F (37.9 C)] 97.5 F (36.4 C) (11/22 0411) Pulse Rate:  [64-146] 64 (11/22 0411) Resp:  [17-20] 19 (11/22 0411) BP: (110-142)/(57-103) 110/67 (11/22 0411) SpO2:  [95 %-100 %] 95 % (11/22 0411) Weight:  [108.9 kg-109.9 kg] 109.9 kg (11/22 0411) Last BM Date: 11/29/19  Intake/Output from previous day: 11/21 0701 - 11/22 0700 In: 686.2 [P.O.:240; I.V.:420; IV Piggyback:26.2] Out: 850 [Urine:850] Intake/Output this shift: No intake/output data recorded.  PE: General: pleasant, WD, WN male who is laying in bed in NAD HEENT: head is normocephalic, atraumatic.  Sclera are anicteric.  PERRL.  Ears and nose without any masses or lesions.  Mouth is pink and moist Heart: HR in the 80s.  Normal s1,s2. No obvious murmurs, gallops, or rubs noted.  Palpable radial and pedal pulses bilaterally Lungs: CTAB, no wheezes, rhonchi, or rales noted.  Respiratory effort nonlabored Abd: soft, NT, ND, +BS, no masses, hernias, or organomegaly MS: all 4 extremities are symmetrical with no cyanosis, clubbing, or edema. Skin: warm and dry with no masses, lesions, or rashes Neuro: Cranial nerves 2-12 grossly intact, sensation is normal throughout Psych: A&Ox3 with an appropriate affect.    Lab Results:  Recent Labs    11/28/19 0525 11/29/19 0318  WBC 21.2* 15.0*  HGB 15.7 13.0  HCT 46.6 38.0*  PLT 232 152   BMET Recent Labs    11/29/19 0318 11/30/19 0631  NA 137 139  K 4.1 4.6  CL 106 106  CO2 23 22  GLUCOSE 114* 119*  BUN 17 11  CREATININE 1.16 1.14  CALCIUM 8.9 9.0   PT/INR Recent Labs    11/28/19 0740 11/29/19 0318  LABPROT 13.6 14.8  INR 1.1 1.2   CMP     Component Value Date/Time   NA 139 11/30/2019 0631    NA 140 04/20/2016 1002   NA 137 04/11/2011 1233   K 4.6 11/30/2019 0631   K 3.6 04/11/2011 1233   CL 106 11/30/2019 0631   CL 104 04/11/2011 1233   CO2 22 11/30/2019 0631   CO2 21 04/11/2011 1233   GLUCOSE 119 (H) 11/30/2019 0631   GLUCOSE 96 04/11/2011 1233   BUN 11 11/30/2019 0631   BUN 16 04/20/2016 1002   BUN 21 (H) 04/11/2011 1233   CREATININE 1.14 11/30/2019 0631   CREATININE 1.44 (H) 04/11/2011 1233   CALCIUM 9.0 11/30/2019 0631   CALCIUM 9.1 04/11/2011 1233   PROT 6.2 (L) 11/30/2019 0631   PROT 6.4 04/20/2016 1002   PROT 7.9 04/09/2011 0825   ALBUMIN 3.1 (L) 11/30/2019 0631   ALBUMIN 4.0 04/20/2016 1002   ALBUMIN 4.2 04/09/2011 0825   AST 149 (H) 11/30/2019 0631   AST 33 04/09/2011 0825   ALT 294 (H) 11/30/2019 0631   ALT 47 04/09/2011 0825   ALKPHOS 146 (H) 11/30/2019 0631   ALKPHOS 87 04/09/2011 0825   BILITOT 3.1 (H) 11/30/2019 0631   BILITOT 1.1 04/20/2016 1002   BILITOT 0.8 04/09/2011 0825   GFRNONAA >60 11/30/2019 0631   GFRNONAA 54 (L) 04/11/2011 1233   GFRAA 60 (L) 05/29/2017 0206   GFRAA >60 04/11/2011 1233   Lipase     Component Value Date/Time   LIPASE 18 11/29/2019 0318  LIPASE 62 (L) 04/09/2011 0825       Studies/Results: DG CHEST PORT 1 VIEW  Result Date: 11/29/2019 CLINICAL DATA:  Chest pain EXAM: PORTABLE CHEST 1 VIEW COMPARISON:  11/28/2019 FINDINGS: Post sternotomy changes. Left-sided pacing device as before. Atelectasis or scar at the left base. No pleural effusion. Stable cardiomediastinal silhouette. No pneumothorax. IMPRESSION: Atelectasis or scar at the left base. Electronically Signed   By: Jasmine Pang M.D.   On: 11/29/2019 23:19   ECHOCARDIOGRAM COMPLETE  Result Date: 11/29/2019    ECHOCARDIOGRAM REPORT   Patient Name:   Shawn Elliott Date of Exam: 11/29/2019 Medical Rec #:  [id number]        Height:       74.0 in Accession #:    [id number]       Weight:       240.0 lb Date of Birth:  [birth year]         BSA:          2.349  m Patient Age:    64 years         BP:           126/69 mmHg Patient Gender: M                HR:           70 bpm. Exam Location:  Inpatient Procedure: 2D Echo, Cardiac Doppler, Color Doppler and Intracardiac            Opacification Agent Indications:    Elevated troponin.  History:        Patient has prior history of Echocardiogram examinations, most                 recent 01/14/2016. CHF, Previous Myocardial Infarction and CAD,                 Abnormal ECG, Arrythmias:Atrial Fibrillation;                 Signs/Symptoms:Chest Pain.  Sonographer:    Sheralyn Boatman RDCS Referring Phys: [id number] CAROLE N HALL  Sonographer Comments: Technically difficult study due to poor echo windows. Image acquisition challenging due to patient body habitus. IMPRESSIONS  1. Diffuse hypokinesis worse in the posterior/inferior lateral segments EF similar and moderately reduced compared to echo done 2018. Left ventricular ejection fraction, by estimation, is 35 to 40%. The left ventricle has moderately decreased function. The left ventricle has no regional wall motion abnormalities. The left ventricular internal cavity size was moderately dilated. Left ventricular diastolic parameters were normal.  2. Right ventricular systolic function is normal. The right ventricular size is normal.  3. Left atrial size was mildly dilated.  4. The mitral valve is normal in structure. Mild mitral valve regurgitation. No evidence of mitral stenosis.  5. The aortic valve is tricuspid. Aortic valve regurgitation is not visualized. Mild aortic valve sclerosis is present, with no evidence of aortic valve stenosis.  6. The inferior vena cava is normal in size with greater than 50% respiratory variability, suggesting right atrial pressure of 3 mmHg. FINDINGS  Left Ventricle: Diffuse hypokinesis worse in the posterior/inferior lateral segments EF similar and moderately reduced compared to echo done 2018. Left ventricular ejection fraction, by estimation, is 35 to  40%. The left ventricle has moderately decreased function. The left ventricle has no regional wall motion abnormalities. Definity contrast agent was given IV to delineate the left ventricular endocardial borders. The left ventricular internal cavity size was moderately dilated. There  is no left ventricular hypertrophy. Left ventricular diastolic parameters were normal. Right Ventricle: The right ventricular size is normal. No increase in right ventricular wall thickness. Right ventricular systolic function is normal. Left Atrium: Left atrial size was mildly dilated. Right Atrium: Right atrial size was normal in size. Pericardium: There is no evidence of pericardial effusion. Mitral Valve: The mitral valve is normal in structure. Mild mitral valve regurgitation. No evidence of mitral valve stenosis. Tricuspid Valve: The tricuspid valve is normal in structure. Tricuspid valve regurgitation is not demonstrated. No evidence of tricuspid stenosis. Aortic Valve: The aortic valve is tricuspid. Aortic valve regurgitation is not visualized. Mild aortic valve sclerosis is present, with no evidence of aortic valve stenosis. Pulmonic Valve: The pulmonic valve was normal in structure. Pulmonic valve regurgitation is mild. No evidence of pulmonic stenosis. Aorta: The aortic root is normal in size and structure. Venous: The inferior vena cava is normal in size with greater than 50% respiratory variability, suggesting right atrial pressure of 3 mmHg. IAS/Shunts: No atrial level shunt detected by color flow Doppler.  LEFT VENTRICLE PLAX 2D LVIDd:         6.20 cm      Diastology LVIDs:         5.20 cm      LV e' medial:    6.53 cm/s LV PW:         1.10 cm      LV E/e' medial:  14.5 LV IVS:        1.10 cm      LV e' lateral:   10.10 cm/s LVOT diam:     2.30 cm      LV E/e' lateral: 9.4 LV SV:         78 LV SV Index:   33 LVOT Area:     4.15 cm  LV Volumes (MOD) LV vol d, MOD A2C: 231.0 ml LV vol d, MOD A4C: 156.5 ml LV vol s, MOD  A2C: 144.0 ml LV vol s, MOD A4C: 107.0 ml LV SV MOD A2C:     87.0 ml LV SV MOD A4C:     156.5 ml LV SV MOD BP:      75.1 ml RIGHT VENTRICLE RV S prime:     8.81 cm/s TAPSE (M-mode): 1.2 cm LEFT ATRIUM             Index       RIGHT ATRIUM           Index LA diam:        3.90 cm 1.66 cm/m  RA Area:     14.70 cm LA Vol (A2C):   64.9 ml 27.63 ml/m RA Volume:   35.90 ml  15.29 ml/m LA Vol (A4C):   61.3 ml 26.10 ml/m LA Biplane Vol: 65.2 ml 27.76 ml/m  AORTIC VALVE             PULMONIC VALVE LVOT Vmax:   101.00 cm/s PR End Diast Vel: 1.98 msec LVOT Vmean:  70.000 cm/s LVOT VTI:    0.187 m  AORTA Ao Root diam: 4.00 cm Ao Asc diam:  3.60 cm MITRAL VALVE MV Area (PHT): 5.66 cm    SHUNTS MV Decel Time: 134 msec    Systemic VTI:  0.19 m MV E velocity: 94.60 cm/s  Systemic Diam: 2.30 cm MV A velocity: 47.00 cm/s MV E/A ratio:  2.01 Charlton Haws MD Electronically signed by Charlton Haws MD Signature Date/Time: 11/29/2019/10:25:35 AM    Final  Anti-infectives: Anti-infectives (From admission, onward)   Start     Dose/Rate Route Frequency Ordered Stop   11/28/19 2200  metroNIDAZOLE (FLAGYL) tablet 500 mg  Status:  Discontinued        500 mg Oral Every 8 hours 11/28/19 1957 11/28/19 2013   11/28/19 2200  ceFEPIme (MAXIPIME) 2 g in sodium chloride 0.9 % 100 mL IVPB  Status:  Discontinued        2 g 200 mL/hr over 30 Minutes Intravenous Every 8 hours 11/28/19 1957 11/28/19 2013   11/28/19 2200  meropenem (MERREM) 1 g in sodium chloride 0.9 % 100 mL IVPB        1 g 200 mL/hr over 30 Minutes Intravenous Every 8 hours 11/28/19 2013         Assessment/Plan CAD with hx of STEMI s/p stenting 2018 HTN HLD GERD Elevated troponins - cards following and feel it is from demand ischemia, cleared to proceed with surgery  Klebsiella and enterobacter bacteremia   Choledocholithiasis - Tbili 3.1 from 7.1 yesterday, AST/ALT downtrending as well - WBC 15, afeb - MRCP was scheduled for this AM but since labs are  all downtrending, will take to OR for lap chole with IOC - surgical risks and benefits explained and patient agreeable to proceed - consent signed and placed in chart  FEN: NPO VTE: heparin gtt on hold ID: merrem 11/20>>  LOS: 2 days    Juliet Rude , [facility] 11/30/2019, 8:04 AM Please see Amion for pager number during day hours 7:00am-4:30pm

## 2019-11-30 NOTE — OR Nursing (Addendum)
Pt is awake,alert and oriented.Pt and/or family verbalized understanding of poc and discharge instructions. Reviewed admission and on going care with receiving RN. Pt is in NAD at this time and is ready to be transferred to floor. Will con't to monitor until pt is transferred. Pt on monitor to floor with RN and [employer]

## 2019-11-30 NOTE — Transfer of Care (Signed)
Immediate Anesthesia Transfer of Care Note  Patient: Shawn Elliott  Procedure(s) Performed: LAPAROSCOPIC CHOLECYSTECTOMY (N/A Abdomen)  Patient Location: PACU  Anesthesia Type:General  Level of Consciousness: awake, alert  and oriented  Airway & Oxygen Therapy: Patient Spontanous Breathing and Patient connected to face mask oxygen  Post-op Assessment: Report given to RN and Post -op Vital signs reviewed and stable  Post vital signs: Reviewed and stable  Last Vitals:  Vitals Value Taken Time  BP 124/65 11/30/19 1118  Temp    Pulse 56 11/30/19 1120  Resp 17 11/30/19 1120  SpO2 98 % 11/30/19 1120  Vitals shown include unvalidated device data.  Last Pain:  Vitals:   11/30/19 1115  TempSrc:   PainSc: (P) Asleep      Patients Stated Pain Goal: 0 (11/29/19 2337)  Complications: No complications documented.

## 2019-11-30 NOTE — Anesthesia Preprocedure Evaluation (Addendum)
Anesthesia Evaluation  Patient identified by MRN, date of birth, ID band Patient awake    Reviewed: Allergy & Precautions, H&P , NPO status , Patient's Chart, lab work & pertinent test results, reviewed documented beta blocker date and time   History of Anesthesia Complications (+) PONV and history of anesthetic complications  Airway Mallampati: II  TM Distance: >3 FB Neck ROM: full    Dental no notable dental hx. (+) Teeth Intact, Dental Advisory Given   Pulmonary neg pulmonary ROS, former smoker,    Pulmonary exam normal breath sounds clear to auscultation       Cardiovascular Exercise Tolerance: Good hypertension, Pt. on medications and Pt. on home beta blockers + angina + CAD, + Past MI and + CABG  + dysrhythmias Atrial Fibrillation + Cardiac Defibrillator  Rhythm:regular Rate:Normal  ECHO 11/21 1. Diffuse hypokinesis worse in the posterior/inferior lateral segments  EF similar and moderately reduced compared to echo done 2018. Left  ventricular ejection fraction, by estimation, is 35 to 40%. The left  ventricle has moderately decreased function.  The left ventricle has no regional wall motion abnormalities. The left  ventricular internal cavity size was moderately dilated. Left ventricular  diastolic parameters were normal.    Neuro/Psych negative neurological ROS  negative psych ROS   GI/Hepatic Neg liver ROS, GERD  Medicated,  Endo/Other  Morbid obesity  Renal/GU negative Renal ROS  negative genitourinary   Musculoskeletal   Abdominal (+) + obese,   Peds  Hematology negative hematology ROS (+)   Anesthesia Other Findings   Reproductive/Obstetrics negative OB ROS                            Anesthesia Physical Anesthesia Plan  ASA: III  Anesthesia Plan: General   Post-op Pain Management:    Induction: Intravenous and Cricoid pressure planned  PONV Risk Score and Plan: 3  and Ondansetron, Dexamethasone, Treatment may vary due to age or medical condition and Midazolam  Airway Management Planned: Oral ETT  Additional Equipment: None  Intra-op Plan:   Post-operative Plan: Extubation in OR  Informed Consent: I have reviewed the patients History and Physical, chart, labs and discussed the procedure including the risks, benefits and alternatives for the proposed anesthesia with the patient or authorized representative who has indicated his/her understanding and acceptance.     Dental Advisory Given  Plan Discussed with: CRNA and Anesthesiologist  Anesthesia Plan Comments: (  )        Anesthesia Quick Evaluation

## 2019-11-30 NOTE — Anesthesia Postprocedure Evaluation (Signed)
Anesthesia Post Note  Patient: Shawn Elliott  Procedure(s) Performed: LAPAROSCOPIC CHOLECYSTECTOMY (N/A Abdomen)     Patient location during evaluation: PACU Anesthesia Type: General Level of consciousness: awake and alert Pain management: pain level controlled Vital Signs Assessment: post-procedure vital signs reviewed and stable Respiratory status: spontaneous breathing, nonlabored ventilation, respiratory function stable and patient connected to nasal cannula oxygen Cardiovascular status: blood pressure returned to baseline and stable Postop Assessment: no apparent nausea or vomiting Anesthetic complications: no   No complications documented.  Last Vitals:  Vitals:   11/30/19 0740 11/30/19 1115  BP:  124/65  Pulse:  71  Resp:  18  Temp: 36.8 C 36.5 C  SpO2:  98%    Last Pain:  Vitals:   11/30/19 1115  TempSrc:   PainSc: Asleep                 Alfredo Collymore

## 2019-11-30 NOTE — Progress Notes (Signed)
Progress Note    [name]  EPP:[id number] DOB: 07-22-1955  DOA: 11/28/2019 PCP: Manfred Arch, MD      Brief Narrative:    Medical records reviewed and are as summarized below:  RANDEN Elliott is a 64 y.o. male       Assessment/Plan:   Active Problems:   RUQ abdominal pain   Demand ischemia (HCC)   Cholelithiasis   Elevated liver enzymes   Body mass index is 31.11 kg/m.  (Obesity)   Cholelithiasis, elevated liver enzymes, dilated CBD,? CBD stone, acute cholecystitis: s/p laparoscopic cholecystectomy today.  Discontinue IV antibiotics.  Plan for possible MRCP tomorrow or outpatient endoscopic ultrasound depending on tomorrow's liver enzymes.  Monitor liver enzymes.  Follow-up with general surgeon and gastroenterologist.   CAD with previous STEMI s/p coronary stent in 01/2016, s/p CABG in 07/2012; elevated troponin likely due to demand ischemia: Continue metoprolol.  Resume aspirin  Chronic systolic dysfunction: Compensated.  Continue Aldactone.  Lisinopril is on hold.  2D echo November 29, 2019 showed EF estimated at 35 to 40% compared to 2D echo in 01/2016 with EF of 40 to 45%.  He has an ICD in place.  A. fib with RVR: Heart rate has improved.  Telemetry shows paced rhythm.  Other comorbidities include hypertension, GERD, hyperlipidemia, paroxysmal atrial fibrillation   Diet Order            Diet regular Room service appropriate? Yes; Fluid consistency: Thin  Diet effective now                    Consultants:  Cardiologist  General surgeon  Gastroenterologist  Procedures:  None    Medications:   . ezetimibe  10 mg Oral QHS  . metoprolol succinate  100 mg Oral Daily  . multivitamin with minerals  1 tablet Oral Daily  . omega-3 acid ethyl esters  2 g Oral BID  . pantoprazole  40 mg Oral Daily  . senna-docusate  2 tablet Oral QHS  . sodium chloride flush  3 mL Intravenous Q12H  . spironolactone  25 mg Oral Daily    Continuous Infusions: . sodium chloride       Anti-infectives (From admission, onward)   Start     Dose/Rate Route Frequency Ordered Stop   11/28/19 2200  metroNIDAZOLE (FLAGYL) tablet 500 mg  Status:  Discontinued        500 mg Oral Every 8 hours 11/28/19 1957 11/28/19 2013   11/28/19 2200  ceFEPIme (MAXIPIME) 2 g in sodium chloride 0.9 % 100 mL IVPB  Status:  Discontinued        2 g 200 mL/hr over 30 Minutes Intravenous Every 8 hours 11/28/19 1957 11/28/19 2013   11/28/19 2200  meropenem (MERREM) 1 g in sodium chloride 0.9 % 100 mL IVPB  Status:  Discontinued        1 g 200 mL/hr over 30 Minutes Intravenous Every 8 hours 11/28/19 2013 11/30/19 1214             Family Communication/Anticipated D/C date and plan/Code Status   DVT prophylaxis: SCDs Start: 11/28/19 1933     Code Status: Full Code  Family Communication: Plan discussed with his wife at the bedside Disposition Plan:    Status is: Inpatient  Remains inpatient appropriate because:Inpatient level of care appropriate due to severity of illness   Dispo:  Patient From: Home  Planned Disposition: Home  Expected discharge date: 12/01/19  Medically  stable for discharge: No            Subjective:   Interval events noted.  Last night, patient went into rapid A. fib but he converted to normal sinus rhythm after he was given metoprolol.  He complains of soreness in the abdomen from today's cholecystectomy.  Objective:    Vitals:   11/30/19 1115 11/30/19 1130 11/30/19 1145 11/30/19 1258  BP: 124/65 128/65 (!) 150/80 139/85  Pulse: 71 (!) 58 61 68  Resp: Temp: 97.7 F (36.5 C)     TempSrc:      SpO2: 98% 98% 96%   Weight:      Height:       No data found.   Intake/Output Summary (Last 24 hours) at 11/30/2019 1710 Last data filed at 11/30/2019 1500 Gross per 24 hour  Intake 700 ml  Output 1300 ml  Net -600 ml   Filed Weights   11/29/19 1425 11/30/19 0411  Weight: 108.9  kg 109.9 kg    Exam:  GEN: NAD SKIN: Warm and dry EYES: No pallor or icterus ENT: MMM CV: RRR PULM: CTA B ABD: Multiple small surgical scars from cholecystectomy, soft, obese, surgical tenderness, +BS CNS: AAO x 3, non focal EXT: No edema or tenderness    Data Reviewed:   I have personally reviewed following labs and imaging studies:  Labs: Labs show the following:   Basic Metabolic Panel: Recent Labs  Lab 11/28/19 0525 11/28/19 0525 11/29/19 0318 11/30/19 0631  NA 140  --  137 139  K 3.5   < > 4.1 4.6  CL 108  --  106 106  CO2 22  --  23 22  GLUCOSE 168*  --  114* 119*  BUN 27*  --  17 11  CREATININE 1.11  --  1.16 1.14  CALCIUM 8.5*  --  8.9 9.0   < > = values in this interval not displayed.   GFR Estimated Creatinine Clearance: 87.5 mL/min (by C-G formula based on SCr of 1.14 mg/dL). Liver Function Tests: Recent Labs  Lab 11/28/19 0525 11/29/19 0318 11/30/19 0631  AST 404* 258* 149*  ALT 419* 390* 294*  ALKPHOS 155* 131* 146*  BILITOT 2.7* 7.1* 3.1*  PROT 7.1 5.4* 6.2*  ALBUMIN 4.2 3.1* 3.1*   Recent Labs  Lab 11/28/19 0525 11/29/19 0318  LIPASE 42 18  AMYLASE  --  23*   No results for input(s): AMMONIA in the last 168 hours. Coagulation profile Recent Labs  Lab 11/28/19 0740 11/29/19 0318  INR 1.1 1.2    CBC: Recent Labs  Lab 11/28/19 0525 11/29/19 0318 11/30/19 0631  WBC 21.2* 15.0* 9.7  NEUTROABS 19.4* 12.5* 7.2  HGB 15.7 13.0 14.5  HCT 46.6 38.0* 42.3  MCV 92.6 92.5 92.0  PLT 232 152 158   Cardiac Enzymes: No results for input(s): CKTOTAL, CKMB, CKMBINDEX, TROPONINI in the last 168 hours. BNP (last 3 results) No results for input(s): PROBNP in the last 8760 hours. CBG: Recent Labs  Lab 11/28/19 2044 11/29/19 0013 11/29/19 0430 11/29/19 0759 11/29/19 1245  GLUCAP 98 116* 114* 99 106*   D-Dimer: No results for input(s): DDIMER in the last 72 hours. Hgb A1c: Recent Labs    11/29/19 0318  HGBA1C 5.4   Lipid  Profile: No results for input(s): CHOL, HDL, LDLCALC, TRIG, CHOLHDL, LDLDIRECT in the last 72 hours. Thyroid function studies: No results for input(s): TSH, T4TOTAL, T3FREE, THYROIDAB in the last 72  hours.  Invalid input(s): FREET3 Anemia work up: No results for input(s): VITAMINB12, FOLATE, FERRITIN, TIBC, IRON, RETICCTPCT in the last 72 hours. Sepsis Labs: Recent Labs  Lab 11/28/19 0525 11/28/19 0740 11/28/19 1126 11/29/19 0318 11/30/19 0631  WBC 21.2*  --   --  15.0* 9.7  LATICACIDVEN  --  3.3* 2.5* 1.0  --     Microbiology Recent Results (from the past 240 hour(s))  Resp Panel by RT-PCR (Flu A&B, Covid) Nasopharyngeal Swab     Status: None   Collection Time: 11/28/19  6:36 AM   Specimen: Nasopharyngeal Swab; Nasopharyngeal(NP) swabs in vial transport medium  Result Value Ref Range Status   SARS Coronavirus 2 by RT PCR NEGATIVE NEGATIVE Final    Comment: (NOTE) SARS-CoV-2 target nucleic acids are NOT DETECTED.  The SARS-CoV-2 RNA is generally detectable in upper respiratory specimens during the acute phase of infection. The lowest concentration of SARS-CoV-2 viral copies this assay can detect is 138 copies/mL. A negative result does not preclude SARS-Cov-2 infection and should not be used as the sole basis for treatment or other patient management decisions. A negative result may occur with  improper specimen collection/handling, submission of specimen other than nasopharyngeal swab, presence of viral mutation(s) within the areas targeted by this assay, and inadequate number of viral copies(<138 copies/mL). A negative result must be combined with clinical observations, patient history, and epidemiological information. The expected result is Negative.  Fact Sheet for Patients:  BloggerCourse.com  Fact Sheet for Healthcare Providers:  SeriousBroker.it  This test is no t yet approved or cleared by the [place] FDA  and  has been authorized for detection and/or diagnosis of SARS-CoV-2 by FDA under an Emergency Use Authorization (EUA). This EUA will remain  in effect (meaning this test can be used) for the duration of the COVID-19 declaration under Section 564(b)(1) of the Act, 21 U.S.C.section 360bbb-3(b)(1), unless the authorization is terminated  or revoked sooner.       Influenza A by PCR NEGATIVE NEGATIVE Final   Influenza B by PCR NEGATIVE NEGATIVE Final    Comment: (NOTE) The Xpert Xpress SARS-CoV-2/FLU/RSV plus assay is intended as an aid in the diagnosis of influenza from Nasopharyngeal swab specimens and should not be used as a sole basis for treatment. Nasal washings and aspirates are unacceptable for Xpert Xpress SARS-CoV-2/FLU/RSV testing.  Fact Sheet for Patients: BloggerCourse.com  Fact Sheet for Healthcare Providers: SeriousBroker.it  This test is not yet approved or cleared by the [place] FDA and has been authorized for detection and/or diagnosis of SARS-CoV-2 by FDA under an Emergency Use Authorization (EUA). This EUA will remain in effect (meaning this test can be used) for the duration of the COVID-19 declaration under Section 564(b)(1) of the Act, 21 U.S.C. section 360bbb-3(b)(1), unless the authorization is terminated or revoked.  Performed at [facility], [address] [place], [place] [address]   Urine culture     Status: None   Collection Time: 11/28/19  7:22 AM   Specimen: Urine, Random  Result Value Ref Range Status   Specimen Description   Final    URINE, RANDOM Performed at [facility], [address], [place], [place] [address]    Special Requests   Final    NONE Performed at [facility], [address], [place], [place] [address]    Culture   Final    NO GROWTH Performed at [facility], [address] [place], [place] [address]  Report Status  11/29/2019 FINAL  Final  Blood Culture (routine x 2)     Status: Abnormal (Preliminary result)   Collection Time: 11/28/19  7:40 AM   Specimen: BLOOD  Result Value Ref Range Status   Specimen Description   Final    BLOOD LA Performed at [facility], [address] [place], [place] [address]    Special Requests   Final    BOTTLES DRAWN AEROBIC AND ANAEROBIC Blood Culture adequate volume Performed at [facility], [address] [place], [place] [address]    Culture  Setup Time   Final    Organism ID to follow AEROBIC BOTTLE ONLY GRAM NEGATIVE RODS CRITICAL RESULT CALLED TO, READ BACK BY AND VERIFIED WITH: MORGAN CUNNINGHAM 11/29/19 AT 1730 BY ACR    Culture (A)  Final    KLEBSIELLA PNEUMONIAE SUSCEPTIBILITIES TO FOLLOW Performed at [facility], [address] [place], [place] [address]    Report Status PENDING  Incomplete  Blood Culture ID Panel (Reflexed)     Status: Abnormal   Collection Time: 11/28/19  7:40 AM  Result Value Ref Range Status   Enterococcus faecalis NOT DETECTED NOT DETECTED Final   Enterococcus Faecium NOT DETECTED NOT DETECTED Final   Listeria monocytogenes NOT DETECTED NOT DETECTED Final   Staphylococcus species NOT DETECTED NOT DETECTED Final   Staphylococcus aureus (BCID) NOT DETECTED NOT DETECTED Final   Staphylococcus epidermidis NOT DETECTED NOT DETECTED Final   Staphylococcus lugdunensis NOT DETECTED NOT DETECTED Final   Streptococcus species NOT DETECTED NOT DETECTED Final   Streptococcus agalactiae NOT DETECTED NOT DETECTED Final   Streptococcus pneumoniae NOT DETECTED NOT DETECTED Final   Streptococcus pyogenes NOT DETECTED NOT DETECTED Final   A.calcoaceticus-baumannii NOT DETECTED NOT DETECTED Final   Bacteroides fragilis NOT DETECTED NOT DETECTED Final   Enterobacterales DETECTED (A) NOT DETECTED Final    Comment: Enterobacterales represent a large order of gram negative bacteria, not a single  organism. CRITICAL RESULT CALLED TO, READ BACK BY AND VERIFIED WITH: MORGAN CUNNINGHAM 11/29/19 AT 1730 BY ACR    Enterobacter cloacae complex NOT DETECTED NOT DETECTED Final   Escherichia coli NOT DETECTED NOT DETECTED Final   Klebsiella aerogenes NOT DETECTED NOT DETECTED Final   Klebsiella oxytoca NOT DETECTED NOT DETECTED Final   Klebsiella pneumoniae DETECTED (A) NOT DETECTED Final    Comment: CRITICAL RESULT CALLED TO, READ BACK BY AND VERIFIED WITH: MORGAN CUNNINGHAM 11/29/19 AT 1730 BY ACR    Proteus species NOT DETECTED NOT DETECTED Final   Salmonella species NOT DETECTED NOT DETECTED Final   Serratia marcescens NOT DETECTED NOT DETECTED Final   Haemophilus influenzae NOT DETECTED NOT DETECTED Final   Neisseria meningitidis NOT DETECTED NOT DETECTED Final   Pseudomonas aeruginosa NOT DETECTED NOT DETECTED Final   Stenotrophomonas maltophilia NOT DETECTED NOT DETECTED Final   Candida albicans NOT DETECTED NOT DETECTED Final   Candida auris NOT DETECTED NOT DETECTED Final   Candida glabrata NOT DETECTED NOT DETECTED Final   Candida krusei NOT DETECTED NOT DETECTED Final   Candida parapsilosis NOT DETECTED NOT DETECTED Final   Candida tropicalis NOT DETECTED NOT DETECTED Final   Cryptococcus neoformans/gattii NOT DETECTED NOT DETECTED Final   CTX-M ESBL NOT DETECTED NOT DETECTED Final   Carbapenem resistance IMP NOT DETECTED NOT DETECTED Final   Carbapenem resistance KPC NOT DETECTED NOT DETECTED Final   Carbapenem resistance NDM NOT DETECTED NOT DETECTED Final   Carbapenem resist OXA 48 LIKE NOT DETECTED  NOT DETECTED Final   Carbapenem resistance VIM NOT DETECTED NOT DETECTED Final    Comment: Performed at [facility], [address] [place], [place] [address]  Blood Culture (routine x 2)     Status: None (Preliminary result)   Collection Time: 11/28/19  7:41 AM   Specimen: BLOOD  Result Value Ref Range Status   Specimen Description BLOOD RAC  Final   Special  Requests   Final    BOTTLES DRAWN AEROBIC AND ANAEROBIC Blood Culture adequate volume   Culture   Final    NO GROWTH 2 DAYS Performed at [facility], [address] [place], [place] [address]    Report Status PENDING  Incomplete  Surgical pcr screen     Status: None   Collection Time: 11/30/19  8:20 AM   Specimen: Nasal Mucosa; Nasal Swab  Result Value Ref Range Status   MRSA, PCR NEGATIVE NEGATIVE Final   Staphylococcus aureus NEGATIVE NEGATIVE Final    Comment: (NOTE) The Xpert SA Assay (FDA approved for NASAL specimens in patients 30 years of age and older), is one component of a comprehensive surveillance program. It is not intended to diagnose infection nor to guide or monitor treatment. Performed at [facility], [address], [place], [place] [address]     Procedures and diagnostic studies:  DG CHEST PORT 1 VIEW  Result Date: 11/29/2019 CLINICAL DATA:  Chest pain EXAM: PORTABLE CHEST 1 VIEW COMPARISON:  11/28/2019 FINDINGS: Post sternotomy changes. Left-sided pacing device as before. Atelectasis or scar at the left base. No pleural effusion. Stable cardiomediastinal silhouette. No pneumothorax. IMPRESSION: Atelectasis or scar at the left base. Electronically Signed   By: Jasmine Pang M.D.   On: 11/29/2019 23:19   ECHOCARDIOGRAM COMPLETE  Result Date: 11/29/2019    ECHOCARDIOGRAM REPORT   Patient Name:   Shawn Elliott Date of Exam: 11/29/2019 Medical Rec #:  [id number]        Height:       74.0 in Accession #:    [id number]       Weight:       240.0 lb Date of Birth:  [birth year]         BSA:          2.349 m Patient Age:    64 years         BP:           126/69 mmHg Patient Gender: M                HR:           70 bpm. Exam Location:  Inpatient Procedure: 2D Echo, Cardiac Doppler, Color Doppler and Intracardiac            Opacification Agent Indications:    Elevated troponin.  History:        Patient has prior history of Echocardiogram examinations, most                  recent 01/14/2016. CHF, Previous Myocardial Infarction and CAD,                 Abnormal ECG, Arrythmias:Atrial Fibrillation;                 Signs/Symptoms:Chest Pain.  Sonographer:    Sheralyn Boatman RDCS Referring Phys: [id number] CAROLE N HALL  Sonographer Comments: Technically difficult study due to poor echo windows. Image acquisition challenging due to patient body habitus. IMPRESSIONS  1.  Diffuse hypokinesis worse in the posterior/inferior lateral segments EF similar and moderately reduced compared to echo done 2018. Left ventricular ejection fraction, by estimation, is 35 to 40%. The left ventricle has moderately decreased function. The left ventricle has no regional wall motion abnormalities. The left ventricular internal cavity size was moderately dilated. Left ventricular diastolic parameters were normal.  2. Right ventricular systolic function is normal. The right ventricular size is normal.  3. Left atrial size was mildly dilated.  4. The mitral valve is normal in structure. Mild mitral valve regurgitation. No evidence of mitral stenosis.  5. The aortic valve is tricuspid. Aortic valve regurgitation is not visualized. Mild aortic valve sclerosis is present, with no evidence of aortic valve stenosis.  6. The inferior vena cava is normal in size with greater than 50% respiratory variability, suggesting right atrial pressure of 3 mmHg. FINDINGS  Left Ventricle: Diffuse hypokinesis worse in the posterior/inferior lateral segments EF similar and moderately reduced compared to echo done 2018. Left ventricular ejection fraction, by estimation, is 35 to 40%. The left ventricle has moderately decreased function. The left ventricle has no regional wall motion abnormalities. Definity contrast agent was given IV to delineate the left ventricular endocardial borders. The left ventricular internal cavity size was moderately dilated. There is no left ventricular hypertrophy. Left ventricular diastolic parameters  were normal. Right Ventricle: The right ventricular size is normal. No increase in right ventricular wall thickness. Right ventricular systolic function is normal. Left Atrium: Left atrial size was mildly dilated. Right Atrium: Right atrial size was normal in size. Pericardium: There is no evidence of pericardial effusion. Mitral Valve: The mitral valve is normal in structure. Mild mitral valve regurgitation. No evidence of mitral valve stenosis. Tricuspid Valve: The tricuspid valve is normal in structure. Tricuspid valve regurgitation is not demonstrated. No evidence of tricuspid stenosis. Aortic Valve: The aortic valve is tricuspid. Aortic valve regurgitation is not visualized. Mild aortic valve sclerosis is present, with no evidence of aortic valve stenosis. Pulmonic Valve: The pulmonic valve was normal in structure. Pulmonic valve regurgitation is mild. No evidence of pulmonic stenosis. Aorta: The aortic root is normal in size and structure. Venous: The inferior vena cava is normal in size with greater than 50% respiratory variability, suggesting right atrial pressure of 3 mmHg. IAS/Shunts: No atrial level shunt detected by color flow Doppler.  LEFT VENTRICLE PLAX 2D LVIDd:         6.20 cm      Diastology LVIDs:         5.20 cm      LV e' medial:    6.53 cm/s LV PW:         1.10 cm      LV E/e' medial:  14.5 LV IVS:        1.10 cm      LV e' lateral:   10.10 cm/s LVOT diam:     2.30 cm      LV E/e' lateral: 9.4 LV SV:         78 LV SV Index:   33 LVOT Area:     4.15 cm  LV Volumes (MOD) LV vol d, MOD A2C: 231.0 ml LV vol d, MOD A4C: 156.5 ml LV vol s, MOD A2C: 144.0 ml LV vol s, MOD A4C: 107.0 ml LV SV MOD A2C:     87.0 ml LV SV MOD A4C:     156.5 ml LV SV MOD BP:      75.1 ml RIGHT  VENTRICLE RV S prime:     8.81 cm/s TAPSE (M-mode): 1.2 cm LEFT ATRIUM             Index       RIGHT ATRIUM           Index LA diam:        3.90 cm 1.66 cm/m  RA Area:     14.70 cm LA Vol (A2C):   64.9 ml 27.63 ml/m RA Volume:    35.90 ml  15.29 ml/m LA Vol (A4C):   61.3 ml 26.10 ml/m LA Biplane Vol: 65.2 ml 27.76 ml/m  AORTIC VALVE             PULMONIC VALVE LVOT Vmax:   101.00 cm/s PR End Diast Vel: 1.98 msec LVOT Vmean:  70.000 cm/s LVOT VTI:    0.187 m  AORTA Ao Root diam: 4.00 cm Ao Asc diam:  3.60 cm MITRAL VALVE MV Area (PHT): 5.66 cm    SHUNTS MV Decel Time: 134 msec    Systemic VTI:  0.19 m MV E velocity: 94.60 cm/s  Systemic Diam: 2.30 cm MV A velocity: 47.00 cm/s MV E/A ratio:  2.01 Charlton Haws MD Electronically signed by Charlton Haws MD Signature Date/Time: 11/29/2019/10:25:35 AM    Final                LOS: 2 days   Lurene Shadow  [facility]   Pager on [email or web address]. If 7PM-7AM, please contact night-coverage at [email or web address]     11/30/2019, 5:10 PM

## 2019-11-30 NOTE — Plan of Care (Signed)

## 2019-11-30 NOTE — Plan of Care (Signed)
  Problem: Health Behavior/Discharge Planning: Goal: Ability to manage health-related needs will improve Outcome: Progressing   Problem: Clinical Measurements: Goal: Will remain free from infection Outcome: Progressing   Problem: Clinical Measurements: Goal: Diagnostic test results will improve Outcome: Progressing   

## 2019-11-30 NOTE — Progress Notes (Signed)
Back from PACU by bed awake and alert.surgical incision sites with derma bond clean and intact.

## 2019-11-30 NOTE — Progress Notes (Signed)
Progress Note  Patient Name: Shawn Elliott Date of Encounter: 11/30/2019  Primary Cardiologist:   Vita Erm, MD   Subjective   Had PAF yesterday.  Now post op.  He is angry that he did not get his home meds yesterday and he wants to take these.  He says this is why he went into atrial fib.   Inpatient Medications    Scheduled Meds: . ezetimibe  10 mg Oral QHS  . metoprolol succinate  100 mg Oral Daily  . multivitamin with minerals  1 tablet Oral Daily  . omega-3 acid ethyl esters  2 g Oral BID  . pantoprazole  40 mg Oral Daily  . senna-docusate  2 tablet Oral QHS  . sodium chloride flush  3 mL Intravenous Q12H  . spironolactone  25 mg Oral Daily   Continuous Infusions: . sodium chloride    . meropenem (MERREM) IV 1 g (11/30/19 9449)   PRN Meds: sodium chloride, HYDROmorphone (DILAUDID) injection, metoprolol tartrate, ondansetron (ZOFRAN) IV, sodium chloride flush   Vital Signs    Vitals:   11/29/19 2243 11/29/19 2315 11/29/19 2337 11/30/19 0411  BP: 133/88  115/73 110/67  Pulse: (!) 140 68 67 64  Resp:   20 19  Temp:   98 F (36.7 C) (!) 97.5 F (36.4 C)  TempSrc:   Oral Oral  SpO2:   99% 95%  Weight:    109.9 kg  Height:        Intake/Output Summary (Last 24 hours) at 11/30/2019 0724 Last data filed at 11/29/2019 2323 Gross per 24 hour  Intake 686.17 ml  Output 850 ml  Net -163.83 ml   Filed Weights   11/29/19 1425 11/30/19 0411  Weight: 108.9 kg 109.9 kg    Telemetry    Atrial paced with PVCs - Personally Reviewed  ECG    Atria fib with RVR rate 155.  RAD, LPFB, IVCD - Personally Reviewed  Physical Exam   GEN: No acute distress.   Neck: No  JVD Cardiac: RRR, no murmurs, rubs, or gallops.  Respiratory: Clear  to auscultation bilaterally. GI: Soft, tender at umbilicus, non-distended  MS: No  edema; No deformity. Neuro:  Nonfocal  Psych: Normal affect   Labs    Chemistry Recent Labs  Lab 11/28/19 0525 11/29/19 0318  11/30/19 0631  NA 140 137 139  K 3.5 4.1 4.6  CL 108 106 106  CO2 22 23 22   GLUCOSE 168* 114* 119*  BUN 27* 17 11  CREATININE 1.11 1.16 1.14  CALCIUM 8.5* 8.9 9.0  PROT 7.1 5.4* 6.2*  ALBUMIN 4.2 3.1* 3.1*  AST 404* 258* 149*  ALT 419* 390* 294*  ALKPHOS 155* 131* 146*  BILITOT 2.7* 7.1* 3.1*  GFRNONAA >60 >60 >60  ANIONGAP 10 8 11      Hematology Recent Labs  Lab 11/28/19 0525 11/29/19 0318  WBC 21.2* 15.0*  RBC 5.03 4.11*  HGB 15.7 13.0  HCT 46.6 38.0*  MCV 92.6 92.5  MCH 31.2 31.6  MCHC 33.7 34.2  RDW 12.7 13.2  PLT 232 152    Cardiac EnzymesNo results for input(s): TROPONINI in the last 168 hours. No results for input(s): TROPIPOC in the last 168 hours.   BNPNo results for input(s): BNP, PROBNP in the last 168 hours.   DDimer No results for input(s): DDIMER in the last 168 hours.   Radiology    DG CHEST PORT 1 VIEW  Result Date: 11/29/2019 CLINICAL DATA:  Chest pain EXAM:  PORTABLE CHEST 1 VIEW COMPARISON:  11/28/2019 FINDINGS: Post sternotomy changes. Left-sided pacing device as before. Atelectasis or scar at the left base. No pleural effusion. Stable cardiomediastinal silhouette. No pneumothorax. IMPRESSION: Atelectasis or scar at the left base. Electronically Signed   By: Jasmine Pang M.D.   On: 11/29/2019 23:19   ECHOCARDIOGRAM COMPLETE  Result Date: 11/29/2019    ECHOCARDIOGRAM REPORT   Patient Name:   Shawn Elliott Date of Exam: 11/29/2019 Medical Rec #:  [id number]        Height:       74.0 in Accession #:    [id number]       Weight:       240.0 lb Date of Birth:  01/27/1955         BSA:          2.349 m Patient Age:    64 years         BP:           126/69 mmHg Patient Gender: M                HR:           70 bpm. Exam Location:  Inpatient Procedure: 2D Echo, Cardiac Doppler, Color Doppler and Intracardiac            Opacification Agent Indications:    Elevated troponin.  History:        Patient has prior history of Echocardiogram examinations, most                  recent 01/14/2016. CHF, Previous Myocardial Infarction and CAD,                 Abnormal ECG, Arrythmias:Atrial Fibrillation;                 Signs/Symptoms:Chest Pain.  Sonographer:    Sheralyn Boatman RDCS Referring Phys: [id number] CAROLE N HALL  Sonographer Comments: Technically difficult study due to poor echo windows. Image acquisition challenging due to patient body habitus. IMPRESSIONS  1. Diffuse hypokinesis worse in the posterior/inferior lateral segments EF similar and moderately reduced compared to echo done 2018. Left ventricular ejection fraction, by estimation, is 35 to 40%. The left ventricle has moderately decreased function. The left ventricle has no regional wall motion abnormalities. The left ventricular internal cavity size was moderately dilated. Left ventricular diastolic parameters were normal.  2. Right ventricular systolic function is normal. The right ventricular size is normal.  3. Left atrial size was mildly dilated.  4. The mitral valve is normal in structure. Mild mitral valve regurgitation. No evidence of mitral stenosis.  5. The aortic valve is tricuspid. Aortic valve regurgitation is not visualized. Mild aortic valve sclerosis is present, with no evidence of aortic valve stenosis.  6. The inferior vena cava is normal in size with greater than 50% respiratory variability, suggesting right atrial pressure of 3 mmHg. FINDINGS  Left Ventricle: Diffuse hypokinesis worse in the posterior/inferior lateral segments EF similar and moderately reduced compared to echo done 2018. Left ventricular ejection fraction, by estimation, is 35 to 40%. The left ventricle has moderately decreased function. The left ventricle has no regional wall motion abnormalities. Definity contrast agent was given IV to delineate the left ventricular endocardial borders. The left ventricular internal cavity size was moderately dilated. There is no left ventricular hypertrophy. Left ventricular diastolic parameters  were normal. Right Ventricle: The right ventricular size is normal. No increase in right ventricular wall thickness. Right  ventricular systolic function is normal. Left Atrium: Left atrial size was mildly dilated. Right Atrium: Right atrial size was normal in size. Pericardium: There is no evidence of pericardial effusion. Mitral Valve: The mitral valve is normal in structure. Mild mitral valve regurgitation. No evidence of mitral valve stenosis. Tricuspid Valve: The tricuspid valve is normal in structure. Tricuspid valve regurgitation is not demonstrated. No evidence of tricuspid stenosis. Aortic Valve: The aortic valve is tricuspid. Aortic valve regurgitation is not visualized. Mild aortic valve sclerosis is present, with no evidence of aortic valve stenosis. Pulmonic Valve: The pulmonic valve was normal in structure. Pulmonic valve regurgitation is mild. No evidence of pulmonic stenosis. Aorta: The aortic root is normal in size and structure. Venous: The inferior vena cava is normal in size with greater than 50% respiratory variability, suggesting right atrial pressure of 3 mmHg. IAS/Shunts: No atrial level shunt detected by color flow Doppler.  LEFT VENTRICLE PLAX 2D LVIDd:         6.20 cm      Diastology LVIDs:         5.20 cm      LV e' medial:    6.53 cm/s LV PW:         1.10 cm      LV E/e' medial:  14.5 LV IVS:        1.10 cm      LV e' lateral:   10.10 cm/s LVOT diam:     2.30 cm      LV E/e' lateral: 9.4 LV SV:         78 LV SV Index:   33 LVOT Area:     4.15 cm  LV Volumes (MOD) LV vol d, MOD A2C: 231.0 ml LV vol d, MOD A4C: 156.5 ml LV vol s, MOD A2C: 144.0 ml LV vol s, MOD A4C: 107.0 ml LV SV MOD A2C:     87.0 ml LV SV MOD A4C:     156.5 ml LV SV MOD BP:      75.1 ml RIGHT VENTRICLE RV S prime:     8.81 cm/s TAPSE (M-mode): 1.2 cm LEFT ATRIUM             Index       RIGHT ATRIUM           Index LA diam:        3.90 cm 1.66 cm/m  RA Area:     14.70 cm LA Vol (A2C):   64.9 ml 27.63 ml/m RA Volume:    35.90 ml  15.29 ml/m LA Vol (A4C):   61.3 ml 26.10 ml/m LA Biplane Vol: 65.2 ml 27.76 ml/m  AORTIC VALVE             PULMONIC VALVE LVOT Vmax:   101.00 cm/s PR End Diast Vel: 1.98 msec LVOT Vmean:  70.000 cm/s LVOT VTI:    0.187 m  AORTA Ao Root diam: 4.00 cm Ao Asc diam:  3.60 cm MITRAL VALVE MV Area (PHT): 5.66 cm    SHUNTS MV Decel Time: 134 msec    Systemic VTI:  0.19 m MV E velocity: 94.60 cm/s  Systemic Diam: 2.30 cm MV A velocity: 47.00 cm/s MV E/A ratio:  2.01 Charlton Haws MD Electronically signed by Charlton Haws MD Signature Date/Time: 11/29/2019/10:25:35 AM    Final     Cardiac Studies   ECHO:  1. Diffuse hypokinesis worse in the posterior/inferior lateral segments EF similar and moderately reduced compared to echo  done 2018. Left ventricular ejection fraction, by estimation, is 35 to 40%. The left ventricle has moderately decreased function. The left ventricle has no regional wall motion abnormalities. The left ventricular internal cavity size was moderately dilated. Left ventricular diastolic parameters were normal. 2. Right ventricular systolic function is normal. The right ventricular size is normal. 3. Left atrial size was mildly dilated. 4. The mitral valve is normal in structure. Mild mitral valve regurgitation. No evidence of mitral stenosis. 5. The aortic valve is tricuspid. Aortic valve regurgitation is not visualized. Mild aortic valve sclerosis is present, with no evidence of aortic valve stenosis. 6. The inferior vena cava is normal in size with greater than 50% respiratory variability, suggesting right atrial pressure of 3 mmHg.  Patient Profile     64 y.o. 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   Assessment & Plan    ELEVATED TROPONIN:   Felt to be related to demand ischemia.   No change in therapy.  No further ischemia work up at this time.   CAD:  As above.  Denies pain or SOB.   Conservative medical management  ISCHEMIC CARDIOMYOPATHY:  Class IIB.   Medtronic ICD.     PAF:    Now with paced rhythm.  His previous metoprolol has been reordered.     CHOLELITHIASIS:  Now status post cholecystectomy.     For questions or updates, please contact [facility] Please consult [email or web address] for contact info under Cardiology/STEMI.   Signed, Rollene Rotunda, MD  11/30/2019, 7:24 AM

## 2019-12-01 ENCOUNTER — Encounter (HOSPITAL_COMMUNITY): Payer: Self-pay | Admitting: Surgery

## 2019-12-01 DIAGNOSIS — I480 Paroxysmal atrial fibrillation: Secondary | ICD-10-CM | POA: Diagnosis not present

## 2019-12-01 DIAGNOSIS — R6520 Severe sepsis without septic shock: Secondary | ICD-10-CM

## 2019-12-01 DIAGNOSIS — R7881 Bacteremia: Secondary | ICD-10-CM | POA: Diagnosis not present

## 2019-12-01 DIAGNOSIS — I25110 Atherosclerotic heart disease of native coronary artery with unstable angina pectoris: Secondary | ICD-10-CM

## 2019-12-01 DIAGNOSIS — B961 Klebsiella pneumoniae [K. pneumoniae] as the cause of diseases classified elsewhere: Secondary | ICD-10-CM

## 2019-12-01 DIAGNOSIS — K760 Fatty (change of) liver, not elsewhere classified: Secondary | ICD-10-CM

## 2019-12-01 DIAGNOSIS — I4891 Unspecified atrial fibrillation: Secondary | ICD-10-CM

## 2019-12-01 DIAGNOSIS — A419 Sepsis, unspecified organism: Secondary | ICD-10-CM

## 2019-12-01 DIAGNOSIS — Z955 Presence of coronary angioplasty implant and graft: Secondary | ICD-10-CM

## 2019-12-01 DIAGNOSIS — K8001 Calculus of gallbladder with acute cholecystitis with obstruction: Secondary | ICD-10-CM | POA: Diagnosis not present

## 2019-12-01 LAB — CBC WITH DIFFERENTIAL/PLATELET
Abs Immature Granulocytes: 0.1 10*3/uL — ABNORMAL HIGH (ref 0.00–0.07)
Basophils Absolute: 0 10*3/uL (ref 0.0–0.1)
Basophils Relative: 0 %
Eosinophils Absolute: 0 10*3/uL (ref 0.0–0.5)
Eosinophils Relative: 0 %
HCT: 40.8 % (ref 39.0–52.0)
Hemoglobin: 13.9 g/dL (ref 13.0–17.0)
Immature Granulocytes: 1 %
Lymphocytes Relative: 4 %
Lymphs Abs: 0.6 10*3/uL — ABNORMAL LOW (ref 0.7–4.0)
MCH: 30.6 pg (ref 26.0–34.0)
MCHC: 34.1 g/dL (ref 30.0–36.0)
MCV: 89.9 fL (ref 80.0–100.0)
Monocytes Absolute: 1 10*3/uL (ref 0.1–1.0)
Monocytes Relative: 6 %
Neutro Abs: 13.9 10*3/uL — ABNORMAL HIGH (ref 1.7–7.7)
Neutrophils Relative %: 89 %
Platelets: 173 10*3/uL (ref 150–400)
RBC: 4.54 MIL/uL (ref 4.22–5.81)
RDW: 12.8 % (ref 11.5–15.5)
WBC: 15.6 10*3/uL — ABNORMAL HIGH (ref 4.0–10.5)
nRBC: 0 % (ref 0.0–0.2)

## 2019-12-01 LAB — COMPREHENSIVE METABOLIC PANEL
ALT: 246 U/L — ABNORMAL HIGH (ref 0–44)
AST: 95 U/L — ABNORMAL HIGH (ref 15–41)
Albumin: 3.1 g/dL — ABNORMAL LOW (ref 3.5–5.0)
Alkaline Phosphatase: 144 U/L — ABNORMAL HIGH (ref 38–126)
Anion gap: 11 (ref 5–15)
BUN: 16 mg/dL (ref 8–23)
CO2: 21 mmol/L — ABNORMAL LOW (ref 22–32)
Calcium: 8.9 mg/dL (ref 8.9–10.3)
Chloride: 105 mmol/L (ref 98–111)
Creatinine, Ser: 1.12 mg/dL (ref 0.61–1.24)
GFR, Estimated: >60 mL/min (ref >60)
Glucose, Bld: 154 mg/dL — ABNORMAL HIGH (ref 70–99)
Potassium: 4.3 mmol/L (ref 3.5–5.1)
Sodium: 137 mmol/L (ref 135–145)
Total Bilirubin: 1.9 mg/dL — ABNORMAL HIGH (ref 0.3–1.2)
Total Protein: 6 g/dL — ABNORMAL LOW (ref 6.5–8.1)

## 2019-12-01 LAB — CULTURE, BLOOD (ROUTINE X 2): Special Requests: ADEQUATE

## 2019-12-01 MED ORDER — CIPROFLOXACIN IN D5W 400 MG/200ML IV SOLN
400.0000 mg | Freq: Two times a day (BID) | INTRAVENOUS | Status: DC
  Administered 2019-12-01: 400 mg via INTRAVENOUS
  Filled 2019-12-01 (×2): qty 200

## 2019-12-01 MED ORDER — OXYCODONE HCL 5 MG PO TABS: 5.0000 mg | ORAL_TABLET | Freq: Four times a day (QID) | ORAL | 0 refills | Status: DC | PRN

## 2019-12-01 MED ORDER — CIPROFLOXACIN HCL 500 MG PO TABS: 500.0000 mg | ORAL_TABLET | Freq: Two times a day (BID) | ORAL | 0 refills | Status: AC

## 2019-12-01 MED ORDER — OXYCODONE HCL 5 MG PO TABS
5.0000 mg | ORAL_TABLET | Freq: Four times a day (QID) | ORAL | Status: DC | PRN
  Administered 2019-12-01: 5 mg via ORAL
  Filled 2019-12-01: qty 1

## 2019-12-01 NOTE — Progress Notes (Signed)
Shawn Elliott 10:08 AM  Subjective: Patient still has soreness from his surgery but different than his pain that brought him to the hospital and he wants to go home we rediscussed his liver and possible CBD stones he has no other complaints  Objective: Vital signs stable afebrile no acute distress white count slight increase patient not examined today walking around the room liver test all decreased  Assessment: Concerns over CBD stones  Plan: We will repeat a set of liver tests either in [place] or my office in a week or 2 and consider MRCP or EUS and he will call me sooner if any GI symptoms we discussed his previous do colonoscopy as well and please call us back this hospital stay if any other question or problem from our standpoint  [facility]  office [phone] After 5PM or if no answer call [phone]

## 2019-12-01 NOTE — Discharge Instructions (Signed)
Please talk to your primary care doctor about when to resume your Lipitor and Zetia. They are on hold for now as you liver function tests are elevated.   You were cared for by a hospitalist during your hospital stay. If you have any questions about your discharge medications or the care you received while you were in the hospital after you are discharged, you can call the unit and asked to speak with the hospitalist on call if the hospitalist that took care of you is not available. Once you are discharged, your primary care physician will handle any further medical issues.   Please note that NO REFILLS for any discharge medications will be authorized once you are discharged, as it is imperative that you return to your primary care physician (or establish a relationship with a primary care physician if you do not have one) for your aftercare needs so that they can reassess your need for medications and monitor your lab values.  Please take all your medications with you for your next visit with your Primary MD. Please ask your Primary MD to get all Hospital records sent to his/her office. Please request your Primary MD to go over all hospital test results at the follow up.   If you experience worsening of your admission symptoms, develop shortness of breath, chest pain, suicidal or homicidal thoughts or a life threatening emergency, you must seek medical attention immediately by calling 911 or calling your MD.   Shawn Elliott must read the complete instructions/literature along with all the possible adverse reactions/side effects for all the medicines you take including new medications that have been prescribed to you. Take new medicines after you have completely understood and accpet all the possible adverse reactions/side effects.    Do not drive when taking pain medications or sedatives.     Do not take more than prescribed Pain, Sleep and Anxiety Medications   If you have smoked or chewed Tobacco in the  last 2 yrs please stop. Stop any regular alcohol  and or recreational drug use.   Wear Seat belts while driving.   [facility] LAPAROSCOPIC SURGERY: POST OP INSTRUCTIONS Always review your discharge instruction sheet given to you by the facility where your surgery was performed. IF YOU HAVE DISABILITY OR FAMILY LEAVE FORMS, YOU MUST BRING THEM TO THE OFFICE FOR PROCESSING.   DO NOT GIVE THEM TO YOUR DOCTOR.  PAIN CONTROL  1. First take acetaminophen (Tylenol) AND/or ibuprofen (Advil) to control your pain after surgery.  Follow directions on package.  Taking acetaminophen (Tylenol) and/or ibuprofen (Advil) regularly after surgery will help to control your pain and lower the amount of prescription pain medication you may need.  You should not take more than 3,000 mg (3 grams) of acetaminophen (Tylenol) in 24 hours.  You should not take ibuprofen (Advil), aleve, motrin, naprosyn or other NSAIDS if you have a history of stomach ulcers or chronic kidney disease.  2. A prescription for pain medication may be given to you upon discharge.  Take your pain medication as prescribed, if you still have uncontrolled pain after taking acetaminophen (Tylenol) or ibuprofen (Advil). 3. Use ice packs to help control pain. 4. If you need a refill on your pain medication, please contact your pharmacy.  They will contact our office to request authorization. Prescriptions will not be filled after 5pm or on week-ends.  HOME MEDICATIONS 5. Take your usually prescribed medications unless otherwise directed.  DIET 6. You should follow a  light diet the first few days after arrival home.  Be sure to include lots of fluids daily. Avoid fatty, fried foods.   CONSTIPATION 7. It is common to experience some constipation after surgery and if you are taking pain medication.  Increasing fluid intake and taking a stool softener (such as Colace) will usually help or prevent this problem from occurring.  A  mild laxative (Milk of Magnesia or Miralax) should be taken according to package instructions if there are no bowel movements after 48 hours.  WOUND/INCISION CARE 8. Most patients will experience some swelling and bruising in the area of the incisions.  Ice packs will help.  Swelling and bruising can take several days to resolve.  9. Unless discharge instructions indicate otherwise, follow guidelines below  a. STERI-STRIPS - you may remove your outer bandages 48 hours after surgery, and you may shower at that time.  You have steri-strips (small skin tapes) in place directly over the incision.  These strips should be left on the skin for 7-10 days.   b. DERMABOND/SKIN GLUE - you may shower in 24 hours.  The glue will flake off over the next 2-3 weeks. 10. Any sutures or staples will be removed at the office during your follow-up visit.  ACTIVITIES 11. You may resume regular (light) daily activities beginning the next day--such as daily self-care, walking, climbing stairs--gradually increasing activities as tolerated.  You may have sexual intercourse when it is comfortable.  Refrain from any heavy lifting or straining until approved by your doctor. a. You may drive when you are no longer taking prescription pain medication, you can comfortably wear a seatbelt, and you can safely maneuver your car and apply brakes.  FOLLOW-UP 12. You should see your doctor in the office for a follow-up appointment approximately 2-3 weeks after your surgery.  You should have been given your post-op/follow-up appointment when your surgery was scheduled.  If you did not receive a post-op/follow-up appointment, make sure that you call for this appointment within a day or two after you arrive home to insure a convenient appointment time.   WHEN TO CALL YOUR DOCTOR: 1. Fever over 101.0 2. Inability to urinate 3. Continued bleeding from incision. 4. Increased pain, redness, or drainage from the incision. 5. Increasing  abdominal pain  The clinic staff is available to answer your questions during regular business hours.  Please don't hesitate to call and ask to speak to one of the nurses for clinical concerns.  If you have a medical emergency, go to the nearest emergency room or call 911.  A surgeon from [facility] is always on call at the hospital. [address], [place], [place]  [address] ? [address] [address], [place], [place]   [address] [phone] ? [phone] ? FAX [phone] Web site: [email or web address]  ........Marland Kitchen   Managing Your Pain After Surgery Without Opioids    Thank you for participating in our program to help patients manage their pain after surgery without opioids. This is part of our effort to provide you with the best care possible, without exposing you or your family to the risk that opioids pose.  What pain can I expect after surgery? You can expect to have some pain after surgery. This is normal. The pain is typically worse the day after surgery, and quickly begins to get better. Many studies have found that many patients are able to manage their pain after surgery with Over-the-Counter (OTC) medications such as Tylenol and  Motrin. If you have a condition that does not allow you to take Tylenol or Motrin, notify your surgical team.  How will I manage my pain? The best strategy for controlling your pain after surgery is around the clock pain control with Tylenol (acetaminophen) and Motrin (ibuprofen or Advil). Alternating these medications with each other allows you to maximize your pain control. In addition to Tylenol and Motrin, you can use heating pads or ice packs on your incisions to help reduce your pain.  How will I alternate your regular strength over-the-counter pain medication? You will take a dose of pain medication every three hours. ; Start by taking 650 mg of Tylenol (2 pills of 325 mg) ; 3 hours later take 600 mg of Motrin (3  pills of 200 mg) ; 3 hours after taking the Motrin take 650 mg of Tylenol ; 3 hours after that take 600 mg of Motrin.   - 1 -  See example - if your first dose of Tylenol is at 12:00 PM   12:00 PM Tylenol 650 mg (2 pills of 325 mg)  3:00 PM Motrin 600 mg (3 pills of 200 mg)  6:00 PM Tylenol 650 mg (2 pills of 325 mg)  9:00 PM Motrin 600 mg (3 pills of 200 mg)  Continue alternating every 3 hours   We recommend that you follow this schedule around-the-clock for at least 3 days after surgery, or until you feel that it is no longer needed. Use the table on the last page of this handout to keep track of the medications you are taking. Important: Do not take more than 3000mg  of Tylenol or 3200mg  of Motrin in a 24-hour period. Do not take ibuprofen/Motrin if you have a history of bleeding stomach ulcers, severe kidney disease, &/or actively taking a blood thinner  What if I still have pain? If you have pain that is not controlled with the over-the-counter pain medications (Tylenol and Motrin or Advil) you might have what we call "breakthrough" pain. You will receive a prescription for a small amount of an opioid pain medication such as Oxycodone, Tramadol, or Tylenol with Codeine. Use these opioid pills in the first 24 hours after surgery if you have breakthrough pain. Do not take more than 1 pill every 4-6 hours.  If you still have uncontrolled pain after using all opioid pills, don't hesitate to call our staff using the number provided. We will help make sure you are managing your pain in the best way possible, and if necessary, we can provide a prescription for additional pain medication.   Day 1    Time  Name of Medication Number of pills taken  Amount of Acetaminophen  Pain Level   Comments  AM PM       AM PM       AM PM       AM PM       AM PM       AM PM       AM PM       AM PM       Total Daily amount of Acetaminophen Do not take more than  3,000 mg per day      Day 2     Time  Name of Medication Number of pills taken  Amount of Acetaminophen  Pain Level   Comments  AM PM       AM PM       AM PM  AM PM       AM PM       AM PM       AM PM       AM PM       Total Daily amount of Acetaminophen Do not take more than  3,000 mg per day      Day 3    Time  Name of Medication Number of pills taken  Amount of Acetaminophen  Pain Level   Comments  AM PM       AM PM       AM PM       AM PM          AM PM       AM PM       AM PM       AM PM       Total Daily amount of Acetaminophen Do not take more than  3,000 mg per day      Day 4    Time  Name of Medication Number of pills taken  Amount of Acetaminophen  Pain Level   Comments  AM PM       AM PM       AM PM       AM PM       AM PM       AM PM       AM PM       AM PM       Total Daily amount of Acetaminophen Do not take more than  3,000 mg per day      Day 5    Time  Name of Medication Number of pills taken  Amount of Acetaminophen  Pain Level   Comments  AM PM       AM PM       AM PM       AM PM       AM PM       AM PM       AM PM       AM PM       Total Daily amount of Acetaminophen Do not take more than  3,000 mg per day       Day 6    Time  Name of Medication Number of pills taken  Amount of Acetaminophen  Pain Level  Comments  AM PM       AM PM       AM PM       AM PM       AM PM       AM PM       AM PM       AM PM       Total Daily amount of Acetaminophen Do not take more than  3,000 mg per day      Day 7    Time  Name of Medication Number of pills taken  Amount of Acetaminophen  Pain Level   Comments  AM PM       AM PM       AM PM       AM PM       AM PM       AM PM       AM PM       AM PM       Total Daily amount of Acetaminophen Do not take more than  3,000  mg per day        For additional information about how and where to safely dispose of unused opioid medications -  [email or web address]  Disclaimer: This document contains information and/or instructional materials adapted from [facility] for the typical patient with your condition. It does not replace medical advice from your health care provider because your experience may differ from that of the typical patient. Talk to your health care provider if you have any questions about this document, your condition or your treatment plan. Adapted from [facility]

## 2019-12-01 NOTE — Progress Notes (Signed)
Denied any reactions from cipro. Getting ready for discharge.

## 2019-12-01 NOTE — Progress Notes (Signed)
Progress Note  1 Day Post-Op  Subjective: Patient reports soreness in RUQ. Denies nausea. Had a BM yesterday. He reports tramadol is not really helping with pain control.   Objective: Vital signs in last 24 hours: Temp:  [97.6 F (36.4 C)-97.9 F (36.6 C)] 97.7 F (36.5 C) (11/23 0500) Pulse Rate:  [58-71] 59 (11/23 0500) Resp:  [13-20] 16 (11/23 0500) BP: (111-150)/(65-85) 124/80 (11/23 0500) SpO2:  [92 %-98 %] 95 % (11/23 0500) Weight:  [109.9 kg] 109.9 kg (11/23 0500) Last BM Date: 11/29/19  Intake/Output from previous day: 11/22 0701 - 11/23 0700 In: 700 [I.V.:700] Out: 800 [Urine:750; Blood:50] Intake/Output this shift: No intake/output data recorded.  PE: General: pleasant, WD, WN male, NAD HEENT: sclera anicteric Heart: regular, rate, and rhythm.  Lungs:  Respiratory effort nonlabored Abd: soft, appropriately ttp, ND, +BS, incisions c/d/i    Lab Results:  Recent Labs    11/30/19 0631 12/01/19 0009  WBC 9.7 15.6*  HGB 14.5 13.9  HCT 42.3 40.8  PLT 158 173   BMET Recent Labs    11/30/19 0631 12/01/19 0009  NA 139 137  K 4.6 4.3  CL 106 105  CO2 22 21*  GLUCOSE 119* 154*  BUN 11 16  CREATININE 1.14 1.12  CALCIUM 9.0 8.9   PT/INR Recent Labs    11/29/19 0318  LABPROT 14.8  INR 1.2   CMP     Component Value Date/Time   NA 137 12/01/2019 0009   NA 140 04/20/2016 1002   NA 137 04/11/2011 1233   K 4.3 12/01/2019 0009   K 3.6 04/11/2011 1233   CL 105 12/01/2019 0009   CL 104 04/11/2011 1233   CO2 21 (L) 12/01/2019 0009   CO2 21 04/11/2011 1233   GLUCOSE 154 (H) 12/01/2019 0009   GLUCOSE 96 04/11/2011 1233   BUN 16 12/01/2019 0009   BUN 16 04/20/2016 1002   BUN 21 (H) 04/11/2011 1233   CREATININE 1.12 12/01/2019 0009   CREATININE 1.44 (H) 04/11/2011 1233   CALCIUM 8.9 12/01/2019 0009   CALCIUM 9.1 04/11/2011 1233   PROT 6.0 (L) 12/01/2019 0009   PROT 6.4 04/20/2016 1002   PROT 7.9 04/09/2011 0825   ALBUMIN 3.1 (L) 12/01/2019  0009   ALBUMIN 4.0 04/20/2016 1002   ALBUMIN 4.2 04/09/2011 0825   AST 95 (H) 12/01/2019 0009   AST 33 04/09/2011 0825   ALT 246 (H) 12/01/2019 0009   ALT 47 04/09/2011 0825   ALKPHOS 144 (H) 12/01/2019 0009   ALKPHOS 87 04/09/2011 0825   BILITOT 1.9 (H) 12/01/2019 0009   BILITOT 1.1 04/20/2016 1002   BILITOT 0.8 04/09/2011 0825   GFRNONAA >60 12/01/2019 0009   GFRNONAA 54 (L) 04/11/2011 1233   GFRAA 60 (L) 05/29/2017 0206   GFRAA >60 04/11/2011 1233   Lipase     Component Value Date/Time   LIPASE 18 11/29/2019 0318   LIPASE 62 (L) 04/09/2011 0825       Studies/Results: DG CHEST PORT 1 VIEW  Result Date: 11/29/2019 CLINICAL DATA:  Chest pain EXAM: PORTABLE CHEST 1 VIEW COMPARISON:  11/28/2019 FINDINGS: Post sternotomy changes. Left-sided pacing device as before. Atelectasis or scar at the left base. No pleural effusion. Stable cardiomediastinal silhouette. No pneumothorax. IMPRESSION: Atelectasis or scar at the left base. Electronically Signed   By: Jasmine Pang M.D.   On: 11/29/2019 23:19   ECHOCARDIOGRAM COMPLETE  Result Date: 11/29/2019    ECHOCARDIOGRAM REPORT   Patient Name:  Shawn Elliott Date of Exam: 11/29/2019 Medical Rec #:  [id number]        Height:       74.0 in Accession #:    [id number]       Weight:       240.0 lb Date of Birth:  November 11, 1955         BSA:          2.349 m Patient Age:    64 years         BP:           126/69 mmHg Patient Gender: M                HR:           70 bpm. Exam Location:  Inpatient Procedure: 2D Echo, Cardiac Doppler, Color Doppler and Intracardiac            Opacification Agent Indications:    Elevated troponin.  History:        Patient has prior history of Echocardiogram examinations, most                 recent 01/14/2016. CHF, Previous Myocardial Infarction and CAD,                 Abnormal ECG, Arrythmias:Atrial Fibrillation;                 Signs/Symptoms:Chest Pain.  Sonographer:    Sheralyn Boatman RDCS Referring Phys: [id number] CAROLE N  HALL  Sonographer Comments: Technically difficult study due to poor echo windows. Image acquisition challenging due to patient body habitus. IMPRESSIONS  1. Diffuse hypokinesis worse in the posterior/inferior lateral segments EF similar and moderately reduced compared to echo done 2018. Left ventricular ejection fraction, by estimation, is 35 to 40%. The left ventricle has moderately decreased function. The left ventricle has no regional wall motion abnormalities. The left ventricular internal cavity size was moderately dilated. Left ventricular diastolic parameters were normal.  2. Right ventricular systolic function is normal. The right ventricular size is normal.  3. Left atrial size was mildly dilated.  4. The mitral valve is normal in structure. Mild mitral valve regurgitation. No evidence of mitral stenosis.  5. The aortic valve is tricuspid. Aortic valve regurgitation is not visualized. Mild aortic valve sclerosis is present, with no evidence of aortic valve stenosis.  6. The inferior vena cava is normal in size with greater than 50% respiratory variability, suggesting right atrial pressure of 3 mmHg. FINDINGS  Left Ventricle: Diffuse hypokinesis worse in the posterior/inferior lateral segments EF similar and moderately reduced compared to echo done 2018. Left ventricular ejection fraction, by estimation, is 35 to 40%. The left ventricle has moderately decreased function. The left ventricle has no regional wall motion abnormalities. Definity contrast agent was given IV to delineate the left ventricular endocardial borders. The left ventricular internal cavity size was moderately dilated. There is no left ventricular hypertrophy. Left ventricular diastolic parameters were normal. Right Ventricle: The right ventricular size is normal. No increase in right ventricular wall thickness. Right ventricular systolic function is normal. Left Atrium: Left atrial size was mildly dilated. Right Atrium: Right atrial size  was normal in size. Pericardium: There is no evidence of pericardial effusion. Mitral Valve: The mitral valve is normal in structure. Mild mitral valve regurgitation. No evidence of mitral valve stenosis. Tricuspid Valve: The tricuspid valve is normal in structure. Tricuspid valve regurgitation is not demonstrated. No evidence of tricuspid stenosis. Aortic Valve: The  aortic valve is tricuspid. Aortic valve regurgitation is not visualized. Mild aortic valve sclerosis is present, with no evidence of aortic valve stenosis. Pulmonic Valve: The pulmonic valve was normal in structure. Pulmonic valve regurgitation is mild. No evidence of pulmonic stenosis. Aorta: The aortic root is normal in size and structure. Venous: The inferior vena cava is normal in size with greater than 50% respiratory variability, suggesting right atrial pressure of 3 mmHg. IAS/Shunts: No atrial level shunt detected by color flow Doppler.  LEFT VENTRICLE PLAX 2D LVIDd:         6.20 cm      Diastology LVIDs:         5.20 cm      LV e' medial:    6.53 cm/s LV PW:         1.10 cm      LV E/e' medial:  14.5 LV IVS:        1.10 cm      LV e' lateral:   10.10 cm/s LVOT diam:     2.30 cm      LV E/e' lateral: 9.4 LV SV:         78 LV SV Index:   33 LVOT Area:     4.15 cm  LV Volumes (MOD) LV vol d, MOD A2C: 231.0 ml LV vol d, MOD A4C: 156.5 ml LV vol s, MOD A2C: 144.0 ml LV vol s, MOD A4C: 107.0 ml LV SV MOD A2C:     87.0 ml LV SV MOD A4C:     156.5 ml LV SV MOD BP:      75.1 ml RIGHT VENTRICLE RV S prime:     8.81 cm/s TAPSE (M-mode): 1.2 cm LEFT ATRIUM             Index       RIGHT ATRIUM           Index LA diam:        3.90 cm 1.66 cm/m  RA Area:     14.70 cm LA Vol (A2C):   64.9 ml 27.63 ml/m RA Volume:   35.90 ml  15.29 ml/m LA Vol (A4C):   61.3 ml 26.10 ml/m LA Biplane Vol: 65.2 ml 27.76 ml/m  AORTIC VALVE             PULMONIC VALVE LVOT Vmax:   101.00 cm/s PR End Diast Vel: 1.98 msec LVOT Vmean:  70.000 cm/s LVOT VTI:    0.187 m  AORTA Ao  Root diam: 4.00 cm Ao Asc diam:  3.60 cm MITRAL VALVE MV Area (PHT): 5.66 cm    SHUNTS MV Decel Time: 134 msec    Systemic VTI:  0.19 m MV E velocity: 94.60 cm/s  Systemic Diam: 2.30 cm MV A velocity: 47.00 cm/s MV E/A ratio:  2.01 Charlton Haws MD Electronically signed by Charlton Haws MD Signature Date/Time: 11/29/2019/10:25:35 AM    Final     Anti-infectives: Anti-infectives (From admission, onward)   Start     Dose/Rate Route Frequency Ordered Stop   11/28/19 2200  metroNIDAZOLE (FLAGYL) tablet 500 mg  Status:  Discontinued        500 mg Oral Every 8 hours 11/28/19 1957 11/28/19 2013   11/28/19 2200  ceFEPIme (MAXIPIME) 2 g in sodium chloride 0.9 % 100 mL IVPB  Status:  Discontinued        2 g 200 mL/hr over 30 Minutes Intravenous Every 8 hours 11/28/19 1957 11/28/19 2013   11/28/19 2200  meropenem (MERREM) 1 g in sodium chloride 0.9 % 100 mL IVPB  Status:  Discontinued        1 g 200 mL/hr over 30 Minutes Intravenous Every 8 hours 11/28/19 2013 11/30/19 1214       Assessment/Plan CAD with hx of STEMI s/p stenting 2018 HTN HLD GERD Elevated troponins - cards following and feel it is from demand ischemia, cleared to proceed with surgery  Klebsiella and enterobacter bacteremia   Choledocholithiasis S/p lap chole 11/30/19 Dr. Cliffton Asters - unable to get IOC due to fragility of tissues - Tbili down to 1.9, AST/ALT downtrending as well - tramadol not helping with pain, change to oxy ir for pain control - patient tolerating diet and having bowel function - stable for discharge from a surgical perspective, will send Rx for pain meds and put follow up in AVS - no further abx needed from a surgical standpoint  FEN: reg diet  VTE: ok to resume home ASA ID: merrem 11/20>>  LOS: 3 days    Juliet Rude , [facility] 12/01/2019, 8:24 AM Please see Amion for pager number during day hours 7:00am-4:30pm

## 2019-12-01 NOTE — Progress Notes (Signed)
Progress Note  Patient Name: Shawn Elliott Date of Encounter: 12/01/2019  Primary Cardiologist:   Vita Erm, MD   Subjective   No chest pain.  No SOB.  Ready to go home apparently from a GI standpoint.  Inpatient Medications    Scheduled Meds: . aspirin EC  81 mg Oral Daily  . ezetimibe  10 mg Oral QHS  . metoprolol succinate  100 mg Oral Daily  . multivitamin with minerals  1 tablet Oral Daily  . omega-3 acid ethyl esters  2 g Oral BID  . pantoprazole  40 mg Oral Daily  . senna-docusate  2 tablet Oral QHS  . sodium chloride flush  3 mL Intravenous Q12H  . spironolactone  25 mg Oral Daily   Continuous Infusions: . sodium chloride     PRN Meds: sodium chloride, HYDROmorphone (DILAUDID) injection, ibuprofen, metoprolol tartrate, ondansetron (ZOFRAN) IV, sodium chloride flush, traMADol   Vital Signs    Vitals:   11/30/19 2300 11/30/19 2340 12/01/19 0300 12/01/19 0500  BP:  116/69  124/80  Pulse: 67 64 61 (!) 59  Resp: 15 15 15 16   Temp:  97.6 F (36.4 C)  97.7 F (36.5 C)  TempSrc:  Oral  Oral  SpO2: 93% 93% 92% 95%  Weight:    109.9 kg  Height:        Intake/Output Summary (Last 24 hours) at 12/01/2019 0748 Last data filed at 11/30/2019 2100 Gross per 24 hour  Intake 700 ml  Output 800 ml  Net -100 ml   Filed Weights   11/29/19 1425 11/30/19 0411 12/01/19 0500  Weight: 108.9 kg 109.9 kg 109.9 kg    Telemetry    NSR, atrial pacing, PVCs - Personally Reviewed  ECG    NA  - Personally Reviewed  Physical Exam   GEN: No acute distress.     Labs    Chemistry Recent Labs  Lab 11/29/19 0318 11/30/19 0631 12/01/19 0009  NA 137 139 137  K 4.1 4.6 4.3  CL 106 106 105  CO2 23 22 21*  GLUCOSE 114* 119* 154*  BUN 17 11 16   CREATININE 1.16 1.14 1.12  CALCIUM 8.9 9.0 8.9  PROT 5.4* 6.2* 6.0*  ALBUMIN 3.1* 3.1* 3.1*  AST 258* 149* 95*  ALT 390* 294* 246*  ALKPHOS 131* 146* 144*  BILITOT 7.1* 3.1* 1.9*  GFRNONAA >60 >60 >60   ANIONGAP 8 11 11      Hematology Recent Labs  Lab 11/29/19 0318 11/30/19 0631 12/01/19 0009  WBC 15.0* 9.7 15.6*  RBC 4.11* 4.60 4.54  HGB 13.0 14.5 13.9  HCT 38.0* 42.3 40.8  MCV 92.5 92.0 89.9  MCH 31.6 31.5 30.6  MCHC 34.2 34.3 34.1  RDW 13.2 13.0 12.8  PLT 152 158 173    Cardiac EnzymesNo results for input(s): TROPONINI in the last 168 hours. No results for input(s): TROPIPOC in the last 168 hours.   BNPNo results for input(s): BNP, PROBNP in the last 168 hours.   DDimer No results for input(s): DDIMER in the last 168 hours.   Radiology    DG CHEST PORT 1 VIEW  Result Date: 11/29/2019 CLINICAL DATA:  Chest pain EXAM: PORTABLE CHEST 1 VIEW COMPARISON:  11/28/2019 FINDINGS: Post sternotomy changes. Left-sided pacing device as before. Atelectasis or scar at the left base. No pleural effusion. Stable cardiomediastinal silhouette. No pneumothorax. IMPRESSION: Atelectasis or scar at the left base. Electronically Signed   By: 12/03/19 M.D.   On: 11/29/2019  23:19   ECHOCARDIOGRAM COMPLETE  Result Date: 11/29/2019    ECHOCARDIOGRAM REPORT   Patient Name:   Shawn Elliott Date of Exam: 11/29/2019 Medical Rec #:  [id number]        Height:       74.0 in Accession #:    [id number]       Weight:       240.0 lb Date of Birth:  09/28/1955         BSA:          2.349 m Patient Age:    64 years         BP:           126/69 mmHg Patient Gender: M                HR:           70 bpm. Exam Location:  Inpatient Procedure: 2D Echo, Cardiac Doppler, Color Doppler and Intracardiac            Opacification Agent Indications:    Elevated troponin.  History:        Patient has prior history of Echocardiogram examinations, most                 recent 01/14/2016. CHF, Previous Myocardial Infarction and CAD,                 Abnormal ECG, Arrythmias:Atrial Fibrillation;                 Signs/Symptoms:Chest Pain.  Sonographer:    Sheralyn Boatman RDCS Referring Phys: [id number] CAROLE N HALL  Sonographer Comments:  Technically difficult study due to poor echo windows. Image acquisition challenging due to patient body habitus. IMPRESSIONS  1. Diffuse hypokinesis worse in the posterior/inferior lateral segments EF similar and moderately reduced compared to echo done 2018. Left ventricular ejection fraction, by estimation, is 35 to 40%. The left ventricle has moderately decreased function. The left ventricle has no regional wall motion abnormalities. The left ventricular internal cavity size was moderately dilated. Left ventricular diastolic parameters were normal.  2. Right ventricular systolic function is normal. The right ventricular size is normal.  3. Left atrial size was mildly dilated.  4. The mitral valve is normal in structure. Mild mitral valve regurgitation. No evidence of mitral stenosis.  5. The aortic valve is tricuspid. Aortic valve regurgitation is not visualized. Mild aortic valve sclerosis is present, with no evidence of aortic valve stenosis.  6. The inferior vena cava is normal in size with greater than 50% respiratory variability, suggesting right atrial pressure of 3 mmHg. FINDINGS  Left Ventricle: Diffuse hypokinesis worse in the posterior/inferior lateral segments EF similar and moderately reduced compared to echo done 2018. Left ventricular ejection fraction, by estimation, is 35 to 40%. The left ventricle has moderately decreased function. The left ventricle has no regional wall motion abnormalities. Definity contrast agent was given IV to delineate the left ventricular endocardial borders. The left ventricular internal cavity size was moderately dilated. There is no left ventricular hypertrophy. Left ventricular diastolic parameters were normal. Right Ventricle: The right ventricular size is normal. No increase in right ventricular wall thickness. Right ventricular systolic function is normal. Left Atrium: Left atrial size was mildly dilated. Right Atrium: Right atrial size was normal in size.  Pericardium: There is no evidence of pericardial effusion. Mitral Valve: The mitral valve is normal in structure. Mild mitral valve regurgitation. No evidence of mitral valve stenosis. Tricuspid Valve: The  tricuspid valve is normal in structure. Tricuspid valve regurgitation is not demonstrated. No evidence of tricuspid stenosis. Aortic Valve: The aortic valve is tricuspid. Aortic valve regurgitation is not visualized. Mild aortic valve sclerosis is present, with no evidence of aortic valve stenosis. Pulmonic Valve: The pulmonic valve was normal in structure. Pulmonic valve regurgitation is mild. No evidence of pulmonic stenosis. Aorta: The aortic root is normal in size and structure. Venous: The inferior vena cava is normal in size with greater than 50% respiratory variability, suggesting right atrial pressure of 3 mmHg. IAS/Shunts: No atrial level shunt detected by color flow Doppler.  LEFT VENTRICLE PLAX 2D LVIDd:         6.20 cm      Diastology LVIDs:         5.20 cm      LV e' medial:    6.53 cm/s LV PW:         1.10 cm      LV E/e' medial:  14.5 LV IVS:        1.10 cm      LV e' lateral:   10.10 cm/s LVOT diam:     2.30 cm      LV E/e' lateral: 9.4 LV SV:         78 LV SV Index:   33 LVOT Area:     4.15 cm  LV Volumes (MOD) LV vol d, MOD A2C: 231.0 ml LV vol d, MOD A4C: 156.5 ml LV vol s, MOD A2C: 144.0 ml LV vol s, MOD A4C: 107.0 ml LV SV MOD A2C:     87.0 ml LV SV MOD A4C:     156.5 ml LV SV MOD BP:      75.1 ml RIGHT VENTRICLE RV S prime:     8.81 cm/s TAPSE (M-mode): 1.2 cm LEFT ATRIUM             Index       RIGHT ATRIUM           Index LA diam:        3.90 cm 1.66 cm/m  RA Area:     14.70 cm LA Vol (A2C):   64.9 ml 27.63 ml/m RA Volume:   35.90 ml  15.29 ml/m LA Vol (A4C):   61.3 ml 26.10 ml/m LA Biplane Vol: 65.2 ml 27.76 ml/m  AORTIC VALVE             PULMONIC VALVE LVOT Vmax:   101.00 cm/s PR End Diast Vel: 1.98 msec LVOT Vmean:  70.000 cm/s LVOT VTI:    0.187 m  AORTA Ao Root diam: 4.00 cm Ao  Asc diam:  3.60 cm MITRAL VALVE MV Area (PHT): 5.66 cm    SHUNTS MV Decel Time: 134 msec    Systemic VTI:  0.19 m MV E velocity: 94.60 cm/s  Systemic Diam: 2.30 cm MV A velocity: 47.00 cm/s MV E/A ratio:  2.01 Charlton Haws MD Electronically signed by Charlton Haws MD Signature Date/Time: 11/29/2019/10:25:35 AM    Final     Cardiac Studies   ECHO:  1. Diffuse hypokinesis worse in the posterior/inferior lateral segments EF similar and moderately reduced compared to echo done 2018. Left ventricular ejection fraction, by estimation, is 35 to 40%. The left ventricle has moderately decreased function. The left ventricle has no regional wall motion abnormalities. The left ventricular internal cavity size was moderately dilated. Left ventricular diastolic parameters were normal. 2. Right ventricular systolic function is normal. The right  ventricular size is normal. 3. Left atrial size was mildly dilated. 4. The mitral valve is normal in structure. Mild mitral valve regurgitation. No evidence of mitral stenosis. 5. The aortic valve is tricuspid. Aortic valve regurgitation is not visualized. Mild aortic valve sclerosis is present, with no evidence of aortic valve stenosis. 6. The inferior vena cava is normal in size with greater than 50% respiratory variability, suggesting right atrial pressure of 3 mmHg.  Patient Profile     64 y.o. 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   Assessment & Plan    ELEVATED TROPONIN:   Felt to be related to demand ischemia.   No further in patient work up.  The patient has had on chest pain.  He is going to follow with [facility] in [place] and wants to switch his care from [place].  We are working on an appt with Dr. Lalla Brothers in [place]  CAD:  As above.  He denies chest pain or SOB.   ISCHEMIC CARDIOMYOPATHY:  Class IIB.   Med[employer]onic ICD.   He is back on beta blocker and spironolactone.  Resume ACE inhibitor at discharge and follow with PCP.    PAF:    Now with paced rhythm.  He was not on DOAC on admission although this would be indicated based on his Mr. Raul Del has a CHA2DS2 - VASc score of 2.    I spoke with him about this.  He was taken off of anticoagulation because he had no atrial fib on his device interrogation.  This can be followed as an out patient.     CHOLELITHIASIS:  Now status post cholecystectomy.     For questions or updates, please contact [facility] Please consult [email or web address] for contact info under Cardiology/STEMI.   Signed, Rollene Rotunda, MD  12/01/2019, 7:48 AM

## 2019-12-01 NOTE — Discharge Summary (Signed)
Physician Discharge Summary  Shawn Elliott:[id number] DOB: 1955-09-12 DOA: 11/28/2019  PCP: Shawn Arch, MD  Admit date: 11/28/2019 Discharge date: 12/01/2019  Admitted From: home Disposition:  home   Recommendations for Outpatient Follow-up:  1. F/u LFTs- resume statin when able  Home Health:  none  Discharge Condition:  stable   CODE STATUS:  Full code   Consultations:  GI  General surgery  Cardiology  Procedures/Studies: . Lap cholecystectomy   Discharge Diagnoses:  Principal Problem:   Severe sepsis (HCC) Active Problems:   Bacteremia due to Klebsiella pneumoniae   Coronary artery disease involving native coronary artery of native heart with unstable angina pectoris (HCC)   Obesity (BMI 30-39.9)   Hyperlipidemia LDL goal <70   Status post coronary artery stent placement   Atrial fibrillation (HCC)   S/P CABG x 4   Demand ischemia (HCC)   Cholelithiasis   Elevated liver enzymes   Hepatic steatosis     Brief Summary: is a 64 y.o. 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   Hospital Course:   Choledocholithiasis with passed stone  - RUQ [place] 11/28/19 shows gallstones without wall thickening. CBD was dilated to 10 mm.  - He was suspected to have choledocholithiasis.  - As his LFTS continue to improved and thus it was felt that he may have passed the stone and thus an ERCP was not needed - He underwent a lap cholecystectomy on 11/22. No IOC was done. - GI plans to follow his LFT and if not improving as outpt, may order MRCP or EUS  Klebsiella Bacteremia with severe sepsis> elevated RR, tachycardia, leukocytosis and lactic acidosis - WBC count 21 and  Lactic acid peak at 3.3 - blood cultures have grown Klebsiella pneumoniae- likely related to above - he has been on Imipenem as he gets hives and tongue swelling from PCN - will transition him to Cipro and d/c him home for on this for a total of 10 days of treatment   Fatty liver - please follow as outpt - encourage weight loss  Elevated Troponin - h/a CAD with PCI - high sensitivity troponin peaked at 2456 on 11/21 - cardiology feels this was demand ischemia   Ischemic HFrEF w/ EF 35% (08/17/18), 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) - follows with [facility] - cont Aldactone, ASA, metoprolol,Lisinopril, Lipitor  PAF - not on DOAC as he states no A-fib was noted on the pacer when interrogated -  follows at [facility]- cardiology recommends outpt f/u  Discharge Exam: Vitals:   12/01/19 0900 12/01/19 1302  BP: (!) 147/96 112/82  Pulse: 65 (!) 57  Resp:  17  Temp:  98.6 F (37 C)  SpO2:  92%   Vitals:   12/01/19 0300 12/01/19 0500 12/01/19 0900 12/01/19 1302  BP:  124/80 (!) 147/96 112/82  Pulse: 61 (!) 59 65 (!) 57  Resp: 15 16  17   Temp:  97.7 F (36.5 C)  98.6 F (37 C)  TempSrc:  Oral  Oral  SpO2: 92% 95%  92%  Weight:  109.9 kg    Height:        General: Pt is alert, awake, not in acute distress Cardiovascular: RRR, S1/S2 +, no rubs, no gallops Respiratory: CTA  bilaterally, no wheezing, no rhonchi Abdominal: Soft, NT, ND, bowel sounds + Extremities: no edema, no cyanosis   Discharge Instructions  Discharge Instructions    Diet - low sodium heart healthy   Complete by: As directed    Increase activity slowly   Complete by: As directed    No wound care   Complete by: As directed      Allergies as of 12/01/2019      Reactions   Penicillins Hives   Childhood allergic reaction Has patient had a PCN reaction causing immediate rash, facial/tongue/throat swelling, SOB or lightheadedness with hypotension: Yes Has patient had a PCN reaction causing  severe rash involving mucus membranes or skin necrosis: No Has patient had a PCN reaction that required hospitalization No Has patient had a PCN reaction occurring within the last 10 years: No If all of the above answers are "NO", then may proceed with Cephalosporin use.      Medication List    STOP taking these medications   atorvastatin 80 MG tablet Commonly known as: LIPITOR   ezetimibe 10 MG tablet Commonly known as: ZETIA     TAKE these medications   aspirin EC 81 MG tablet Take 81 mg by mouth daily.   ciprofloxacin 500 MG tablet Commonly known as: Cipro Take 1 tablet (500 mg total) by mouth 2 (two) times daily for 6 days.   Fish Oil Triple Strength 1400 MG Caps Take 1,400 mg by mouth every morning.   Fish Oil 1200 MG Caps Take 1,200 mg by mouth at bedtime.   fluocinonide cream 0.05 % Commonly known as: LIDEX Apply 1 application topically daily as needed (topical allergic reactions/itching).   lisinopril 5 MG tablet Commonly known as: ZESTRIL Take 5 mg by mouth daily.   metoprolol succinate 100 MG 24 hr tablet Commonly known as: TOPROL-XL Take 100 mg by mouth daily. Take with or immediately following a meal.   multivitamin with minerals Tabs tablet Take 1 tablet by mouth daily.   nitroGLYCERIN 0.4 MG SL tablet Commonly known as: NITROSTAT Place 1 tablet (0.4 mg total) under the tongue every 5 (five) minutes as needed for chest pain.   omeprazole 20 MG capsule Commonly known as: PRILOSEC Take 20 mg by mouth daily.   oxyCODONE 5 MG immediate release tablet Commonly known as: Oxy IR/ROXICODONE Take 1 tablet (5 mg total) by mouth every 6 (six) hours as needed for moderate pain or severe pain.   spironolactone 25 MG tablet Commonly known as: ALDACTONE Take 25 mg by mouth daily.       Follow-up Information    [facility], [facility]. Go on 12/22/2019.   Specialty: General Surgery Why: Follow up appointment scheduled for 9:15 AM. Please arrive 30 min  prior to appointment time. Bring photo ID and insurance information.  Contact information: [address] [place] [address] [phone]        Shawn Arch, MD. Schedule an appointment as soon as possible for a visit in 1 week(s).   Specialty: Family Medicine Contact information: [address] [place] [address] [phone]        Vita Erm, MD .   Specialty: Internal Medicine Contact information: [address] [place] [address] [phone]              Allergies  Allergen Reactions  . Penicillins Hives    Childhood allergic reaction Has patient had a PCN reaction causing immediate rash, facial/tongue/throat swelling,  SOB or lightheadedness with hypotension: Yes Has patient had a PCN reaction causing severe rash involving mucus membranes or skin necrosis: No Has patient had a PCN reaction that required hospitalization No Has patient had a PCN reaction occurring within the last 10 years: No If all of the above answers are "NO", then may proceed with Cephalosporin use.      DG Chest 2 View  Result Date: 11/28/2019 CLINICAL DATA:  Chest pain. EXAM: CHEST - 2 VIEW COMPARISON:  05/29/2017 and prior radiographs FINDINGS: Cardiomediastinal silhouette is unchanged with evidence of CABG. A LEFT-sided pacemaker/AICD is new since 05/29/2017. Minimal bibasilar scarring again noted. There is no evidence of focal airspace disease, pulmonary edema, suspicious pulmonary nodule/mass, pleural effusion, or pneumothorax. No acute bony abnormalities are identified. IMPRESSION: No evidence of acute cardiopulmonary disease. Electronically Signed   By: Harmon Pier M.D.   On: 11/28/2019 06:51   DG CHEST PORT 1 VIEW  Result Date: 11/29/2019 CLINICAL DATA:  Chest pain EXAM: PORTABLE CHEST 1 VIEW COMPARISON:  11/28/2019 FINDINGS: Post sternotomy changes. Left-sided pacing device as before. Atelectasis or scar at the left base.  No pleural effusion. Stable cardiomediastinal silhouette. No pneumothorax. IMPRESSION: Atelectasis or scar at the left base. Electronically Signed   By: Jasmine Pang M.D.   On: 11/29/2019 23:19   ECHOCARDIOGRAM COMPLETE  Result Date: 11/29/2019    ECHOCARDIOGRAM REPORT   Patient Name:   Shawn Elliott Date of Exam: 11/29/2019 Medical Rec #:  [id number]        Height:       74.0 in Accession #:    [id number]       Weight:       240.0 lb Date of Birth:  March 23, 1955         BSA:          2.349 m Patient Age:    64 years         BP:           126/69 mmHg Patient Gender: M                HR:           70 bpm. Exam Location:  Inpatient Procedure: 2D Echo, Cardiac Doppler, Color Doppler and Intracardiac            Opacification Agent Indications:    Elevated troponin.  History:        Patient has prior history of Echocardiogram examinations, most                 recent 01/14/2016. CHF, Previous Myocardial Infarction and CAD,                 Abnormal ECG, Arrythmias:Atrial Fibrillation;                 Signs/Symptoms:Chest Pain.  Sonographer:    Sheralyn Boatman RDCS Referring Phys: [id number] CAROLE N HALL  Sonographer Comments: Technically difficult study due to poor echo windows. Image acquisition challenging due to patient body habitus. IMPRESSIONS  1. Diffuse hypokinesis worse in the posterior/inferior lateral segments EF similar and moderately reduced compared to echo done 2018. Left ventricular ejection fraction, by estimation, is 35 to 40%. The left ventricle has moderately decreased function. The left ventricle has no regional wall motion abnormalities. The left ventricular internal cavity size was moderately dilated. Left ventricular diastolic parameters were normal.  2. Right ventricular systolic function is normal. The right ventricular size is normal.  3. Left atrial  size was mildly dilated.  4. The mitral valve is normal in structure. Mild mitral valve regurgitation. No evidence of mitral stenosis.  5. The aortic  valve is tricuspid. Aortic valve regurgitation is not visualized. Mild aortic valve sclerosis is present, with no evidence of aortic valve stenosis.  6. The inferior vena cava is normal in size with greater than 50% respiratory variability, suggesting right atrial pressure of 3 mmHg. FINDINGS  Left Ventricle: Diffuse hypokinesis worse in the posterior/inferior lateral segments EF similar and moderately reduced compared to echo done 2018. Left ventricular ejection fraction, by estimation, is 35 to 40%. The left ventricle has moderately decreased function. The left ventricle has no regional wall motion abnormalities. Definity contrast agent was given IV to delineate the left ventricular endocardial borders. The left ventricular internal cavity size was moderately dilated. There is no left ventricular hypertrophy. Left ventricular diastolic parameters were normal. Right Ventricle: The right ventricular size is normal. No increase in right ventricular wall thickness. Right ventricular systolic function is normal. Left Atrium: Left atrial size was mildly dilated. Right Atrium: Right atrial size was normal in size. Pericardium: There is no evidence of pericardial effusion. Mitral Valve: The mitral valve is normal in structure. Mild mitral valve regurgitation. No evidence of mitral valve stenosis. Tricuspid Valve: The tricuspid valve is normal in structure. Tricuspid valve regurgitation is not demonstrated. No evidence of tricuspid stenosis. Aortic Valve: The aortic valve is tricuspid. Aortic valve regurgitation is not visualized. Mild aortic valve sclerosis is present, with no evidence of aortic valve stenosis. Pulmonic Valve: The pulmonic valve was normal in structure. Pulmonic valve regurgitation is mild. No evidence of pulmonic stenosis. Aorta: The aortic root is normal in size and structure. Venous: The inferior vena cava is normal in size with greater than 50% respiratory variability, suggesting right atrial pressure  of 3 mmHg. IAS/Shunts: No atrial level shunt detected by color flow Doppler.  LEFT VENTRICLE PLAX 2D LVIDd:         6.20 cm      Diastology LVIDs:         5.20 cm      LV e' medial:    6.53 cm/s LV PW:         1.10 cm      LV E/e' medial:  14.5 LV IVS:        1.10 cm      LV e' lateral:   10.10 cm/s LVOT diam:     2.30 cm      LV E/e' lateral: 9.4 LV SV:         78 LV SV Index:   33 LVOT Area:     4.15 cm  LV Volumes (MOD) LV vol d, MOD A2C: 231.0 ml LV vol d, MOD A4C: 156.5 ml LV vol s, MOD A2C: 144.0 ml LV vol s, MOD A4C: 107.0 ml LV SV MOD A2C:     87.0 ml LV SV MOD A4C:     156.5 ml LV SV MOD BP:      75.1 ml RIGHT VENTRICLE RV S prime:     8.81 cm/s TAPSE (M-mode): 1.2 cm LEFT ATRIUM             Index       RIGHT ATRIUM           Index LA diam:        3.90 cm 1.66 cm/m  RA Area:     14.70 cm LA Vol (A2C):   64.9  ml 27.63 ml/m RA Volume:   35.90 ml  15.29 ml/m LA Vol (A4C):   61.3 ml 26.10 ml/m LA Biplane Vol: 65.2 ml 27.76 ml/m  AORTIC VALVE             PULMONIC VALVE LVOT Vmax:   101.00 cm/s PR End Diast Vel: 1.98 msec LVOT Vmean:  70.000 cm/s LVOT VTI:    0.187 m  AORTA Ao Root diam: 4.00 cm Ao Asc diam:  3.60 cm MITRAL VALVE MV Area (PHT): 5.66 cm    SHUNTS MV Decel Time: 134 msec    Systemic VTI:  0.19 m MV E velocity: 94.60 cm/s  Systemic Diam: 2.30 cm MV A velocity: 47.00 cm/s MV E/A ratio:  2.01 Charlton Haws MD Electronically signed by Charlton Haws MD Signature Date/Time: 11/29/2019/10:25:35 AM    Final    US ABDOMEN LIMITED RUQ (LIVER/GB)  Result Date: 11/28/2019 CLINICAL DATA:  Upper abdominal pain with nausea for 1 day EXAM: ULTRASOUND ABDOMEN LIMITED RIGHT UPPER QUADRANT COMPARISON:  Abdominal CT April 08, 2011 FINDINGS: Gallbladder: Shadowing gallstones.  No wall thickening or focal tenderness. Common bile duct: Diameter: 1 cm.  Where visualized, no filling defect. Liver: Increased echogenicity. No evidence of mass lesion. Left lobe was not well visualized due to sonographic windows.  Portal vein is patent on color Doppler imaging with normal direction of blood flow towards the liver. Other: Right renal cortical thinning where seen. IMPRESSION: 1. Cholelithiasis without findings of acute cholecystitis. 2. Dilated common bile duct. No visible choledocholithiasis, but partially covered. Consider MRCP given the biliary labs. 3. Hepatic steatosis. 4. Right renal cortical thinning. Electronically Signed   By: Marnee Spring M.D.   On: 11/28/2019 08:27     The results of significant diagnostics from this hospitalization (including imaging, microbiology, ancillary and laboratory) are listed below for reference.     Microbiology: Recent Results (from the past 240 hour(s))  Resp Panel by RT-PCR (Flu A&B, Covid) Nasopharyngeal Swab     Status: None   Collection Time: 11/28/19  6:36 AM   Specimen: Nasopharyngeal Swab; Nasopharyngeal(NP) swabs in vial transport medium  Result Value Ref Range Status   SARS Coronavirus 2 by RT PCR NEGATIVE NEGATIVE Final    Comment: (NOTE) SARS-CoV-2 target nucleic acids are NOT DETECTED.  The SARS-CoV-2 RNA is generally detectable in upper respiratory specimens during the acute phase of infection. The lowest concentration of SARS-CoV-2 viral copies this assay can detect is 138 copies/mL. A negative result does not preclude SARS-Cov-2 infection and should not be used as the sole basis for treatment or other patient management decisions. A negative result may occur with  improper specimen collection/handling, submission of specimen other than nasopharyngeal swab, presence of viral mutation(s) within the areas targeted by this assay, and inadequate number of viral copies(<138 copies/mL). A negative result must be combined with clinical observations, patient history, and epidemiological information. The expected result is Negative.  Fact Sheet for Patients:  BloggerCourse.com  Fact Sheet for Healthcare Providers:   SeriousBroker.it  This test is no t yet approved or cleared by the [place] FDA and  has been authorized for detection and/or diagnosis of SARS-CoV-2 by FDA under an Emergency Use Authorization (EUA). This EUA will remain  in effect (meaning this test can be used) for the duration of the COVID-19 declaration under Section 564(b)(1) of the Act, 21 U.S.C.section 360bbb-3(b)(1), unless the authorization is terminated  or revoked sooner.       Influenza A by PCR NEGATIVE  NEGATIVE Final   Influenza B by PCR NEGATIVE NEGATIVE Final    Comment: (NOTE) The Xpert Xpress SARS-CoV-2/FLU/RSV plus assay is intended as an aid in the diagnosis of influenza from Nasopharyngeal swab specimens and should not be used as a sole basis for treatment. Nasal washings and aspirates are unacceptable for Xpert Xpress SARS-CoV-2/FLU/RSV testing.  Fact Sheet for Patients: BloggerCourse.com  Fact Sheet for Healthcare Providers: SeriousBroker.it  This test is not yet approved or cleared by the [place] FDA and has been authorized for detection and/or diagnosis of SARS-CoV-2 by FDA under an Emergency Use Authorization (EUA). This EUA will remain in effect (meaning this test can be used) for the duration of the COVID-19 declaration under Section 564(b)(1) of the Act, 21 U.S.C. section 360bbb-3(b)(1), unless the authorization is terminated or revoked.  Performed at [facility], [address], [place], [place] [address]   Urine culture     Status: None   Collection Time: 11/28/19  7:22 AM   Specimen: Urine, Random  Result Value Ref Range Status   Specimen Description   Final    URINE, RANDOM Performed at [facility], [address] [place], [place] [address]    Special Requests   Final    NONE Performed at [facility], [address], [place], [place] [address]    Culture   Final     NO GROWTH Performed at [facility], [address], [place], [place] [address]    Report Status 11/29/2019 FINAL  Final  Blood Culture (routine x 2)     Status: Abnormal   Collection Time: 11/28/19  7:40 AM   Specimen: BLOOD  Result Value Ref Range Status   Specimen Description   Final    BLOOD LA Performed at [facility], [address] [place], [place] [address]    Special Requests   Final    BOTTLES DRAWN AEROBIC AND ANAEROBIC Blood Culture adequate volume Performed at [facility], [address] [place], [place] [address]    Culture  Setup Time   Final    Organism ID to follow AEROBIC BOTTLE ONLY GRAM NEGATIVE RODS CRITICAL RESULT CALLED TO, READ BACK BY AND VERIFIED WITH: MORGAN CUNNINGHAM 11/29/19 AT 1730 BY ACR    Culture KLEBSIELLA PNEUMONIAE (A)  Final   Report Status 12/01/2019 FINAL  Final   Organism ID, Bacteria KLEBSIELLA PNEUMONIAE  Final      Susceptibility   Klebsiella pneumoniae - MIC*    AMPICILLIN RESISTANT Resistant     CEFAZOLIN <=4 SENSITIVE Sensitive     CEFEPIME <=0.12 SENSITIVE Sensitive     CEFTAZIDIME <=1 SENSITIVE Sensitive     CEFTRIAXONE <=0.25 SENSITIVE Sensitive     CIPROFLOXACIN <=0.25 SENSITIVE Sensitive     GENTAMICIN <=1 SENSITIVE Sensitive     IMIPENEM <=0.25 SENSITIVE Sensitive     TRIMETH/SULFA <=20 SENSITIVE Sensitive     AMPICILLIN/SULBACTAM <=2 SENSITIVE Sensitive     PIP/TAZO <=4 SENSITIVE Sensitive     * KLEBSIELLA PNEUMONIAE  Blood Culture ID Panel (Reflexed)     Status: Abnormal   Collection Time: 11/28/19  7:40 AM  Result Value Ref Range Status   Enterococcus faecalis NOT DETECTED NOT DETECTED Final   Enterococcus Faecium NOT DETECTED NOT DETECTED Final   Listeria monocytogenes NOT DETECTED NOT DETECTED Final   Staphylococcus species NOT DETECTED NOT DETECTED Final   Staphylococcus aureus (BCID) NOT DETECTED NOT DETECTED Final   Staphylococcus epidermidis NOT DETECTED NOT  DETECTED Final    Staphylococcus lugdunensis NOT DETECTED NOT DETECTED Final   Streptococcus species NOT DETECTED NOT DETECTED Final   Streptococcus agalactiae NOT DETECTED NOT DETECTED Final   Streptococcus pneumoniae NOT DETECTED NOT DETECTED Final   Streptococcus pyogenes NOT DETECTED NOT DETECTED Final   A.calcoaceticus-baumannii NOT DETECTED NOT DETECTED Final   Bacteroides fragilis NOT DETECTED NOT DETECTED Final   Enterobacterales DETECTED (A) NOT DETECTED Final    Comment: Enterobacterales represent a large order of gram negative bacteria, not a single organism. CRITICAL RESULT CALLED TO, READ BACK BY AND VERIFIED WITH: MORGAN CUNNINGHAM 11/29/19 AT 1730 BY ACR    Enterobacter cloacae complex NOT DETECTED NOT DETECTED Final   Escherichia coli NOT DETECTED NOT DETECTED Final   Klebsiella aerogenes NOT DETECTED NOT DETECTED Final   Klebsiella oxytoca NOT DETECTED NOT DETECTED Final   Klebsiella pneumoniae DETECTED (A) NOT DETECTED Final    Comment: CRITICAL RESULT CALLED TO, READ BACK BY AND VERIFIED WITH: MORGAN CUNNINGHAM 11/29/19 AT 1730 BY ACR    Proteus species NOT DETECTED NOT DETECTED Final   Salmonella species NOT DETECTED NOT DETECTED Final   Serratia marcescens NOT DETECTED NOT DETECTED Final   Haemophilus influenzae NOT DETECTED NOT DETECTED Final   Neisseria meningitidis NOT DETECTED NOT DETECTED Final   Pseudomonas aeruginosa NOT DETECTED NOT DETECTED Final   Stenotrophomonas maltophilia NOT DETECTED NOT DETECTED Final   Candida albicans NOT DETECTED NOT DETECTED Final   Candida auris NOT DETECTED NOT DETECTED Final   Candida glabrata NOT DETECTED NOT DETECTED Final   Candida krusei NOT DETECTED NOT DETECTED Final   Candida parapsilosis NOT DETECTED NOT DETECTED Final   Candida tropicalis NOT DETECTED NOT DETECTED Final   Cryptococcus neoformans/gattii NOT DETECTED NOT DETECTED Final   CTX-M ESBL NOT DETECTED NOT DETECTED Final   Carbapenem resistance IMP NOT DETECTED NOT DETECTED  Final   Carbapenem resistance KPC NOT DETECTED NOT DETECTED Final   Carbapenem resistance NDM NOT DETECTED NOT DETECTED Final   Carbapenem resist OXA 48 LIKE NOT DETECTED NOT DETECTED Final   Carbapenem resistance VIM NOT DETECTED NOT DETECTED Final    Comment: Performed at [facility], [address] [place], [place] [address]  Blood Culture (routine x 2)     Status: None (Preliminary result)   Collection Time: 11/28/19  7:41 AM   Specimen: BLOOD  Result Value Ref Range Status   Specimen Description BLOOD RAC  Final   Special Requests   Final    BOTTLES DRAWN AEROBIC AND ANAEROBIC Blood Culture adequate volume   Culture   Final    NO GROWTH 3 DAYS Performed at [facility], [address], [place], [place] [address]    Report Status PENDING  Incomplete  Surgical pcr screen     Status: None   Collection Time: 11/30/19  8:20 AM   Specimen: Nasal Mucosa; Nasal Swab  Result Value Ref Range Status   MRSA, PCR NEGATIVE NEGATIVE Final   Staphylococcus aureus NEGATIVE NEGATIVE Final    Comment: (NOTE) The Xpert SA Assay (FDA approved for NASAL specimens in patients 38 years of age and older), is one component of a comprehensive surveillance program. It is not intended to diagnose infection nor to guide or monitor treatment. Performed at [facility], [address] [place], [place] [address]      Labs: BNP (last 3 results) No results for input(s): BNP in the last 8760 hours. Basic Metabolic Panel: Recent Labs  Lab  11/28/19 0525 11/29/19 0318 11/30/19 0631 12/01/19 0009  NA 140 137 139 137  K 3.5 4.1 4.6 4.3  CL 108 106 106 105  CO2 21*  GLUCOSE 168* 114* 119* 154*  BUN 27* CREATININE 1.11 1.16 1.14 1.12  CALCIUM 8.5* 8.9 9.0 8.9   Liver Function Tests: Recent Labs  Lab 11/28/19 0525 11/29/19 0318 11/30/19 0631 12/01/19 0009  AST 404* 258* 149* 95*  ALT 419* 390* 294* 246*  ALKPHOS 155* 131* 146* 144*  BILITOT 2.7*  7.1* 3.1* 1.9*  PROT 7.1 5.4* 6.2* 6.0*  ALBUMIN 4.2 3.1* 3.1* 3.1*   Recent Labs  Lab 11/28/19 0525 11/29/19 0318  LIPASE 42 18  AMYLASE  --  23*   No results for input(s): AMMONIA in the last 168 hours. CBC: Recent Labs  Lab 11/28/19 0525 11/29/19 0318 11/30/19 0631 12/01/19 0009  WBC 21.2* 15.0* 9.7 15.6*  NEUTROABS 19.4* 12.5* 7.2 13.9*  HGB 15.7 13.0 14.5 13.9  HCT 46.6 38.0* 42.3 40.8  MCV 92.6 92.5 92.0 89.9  PLT 232 152 158 173   Cardiac Enzymes: No results for input(s): CKTOTAL, CKMB, CKMBINDEX, TROPONINI in the last 168 hours. BNP: Invalid input(s): POCBNP CBG: Recent Labs  Lab 11/28/19 2044 11/29/19 0013 11/29/19 0430 11/29/19 0759 11/29/19 1245  GLUCAP 98 116* 114* 99 106*   D-Dimer No results for input(s): DDIMER in the last 72 hours. Hgb A1c Recent Labs    11/29/19 0318  HGBA1C 5.4   Lipid Profile No results for input(s): CHOL, HDL, LDLCALC, TRIG, CHOLHDL, LDLDIRECT in the last 72 hours. Thyroid function studies No results for input(s): TSH, T4TOTAL, T3FREE, THYROIDAB in the last 72 hours.  Invalid input(s): FREET3 Anemia work up No results for input(s): VITAMINB12, FOLATE, FERRITIN, TIBC, IRON, RETICCTPCT in the last 72 hours. Urinalysis    Component Value Date/Time   COLORURINE AMBER (A) 11/28/2019 0722   APPEARANCEUR HAZY (A) 11/28/2019 0722   APPEARANCEUR Clear 04/11/2011 1359   LABSPEC 1.027 11/28/2019 0722   LABSPEC 1.004 04/11/2011 1359   PHURINE 5.0 11/28/2019 0722   GLUCOSEU 50 (A) 11/28/2019 0722   GLUCOSEU Negative 04/11/2011 1359   HGBUR NEGATIVE 11/28/2019 0722   BILIRUBINUR NEGATIVE 11/28/2019 0722   BILIRUBINUR Negative 04/11/2011 1359   KETONESUR NEGATIVE 11/28/2019 0722   PROTEINUR 30 (A) 11/28/2019 0722   UROBILINOGEN 0.2 12/20/2009 1840   NITRITE NEGATIVE 11/28/2019 0722   LEUKOCYTESUR NEGATIVE 11/28/2019 0722   LEUKOCYTESUR Negative 04/11/2011 1359   Sepsis Labs Invalid input(s): PROCALCITONIN,  WBC,   LACTICIDVEN Microbiology Recent Results (from the past 240 hour(s))  Resp Panel by RT-PCR (Flu A&B, Covid) Nasopharyngeal Swab     Status: None   Collection Time: 11/28/19  6:36 AM   Specimen: Nasopharyngeal Swab; Nasopharyngeal(NP) swabs in vial transport medium  Result Value Ref Range Status   SARS Coronavirus 2 by RT PCR NEGATIVE NEGATIVE Final    Comment: (NOTE) SARS-CoV-2 target nucleic acids are NOT DETECTED.  The SARS-CoV-2 RNA is generally detectable in upper respiratory specimens during the acute phase of infection. The lowest concentration of SARS-CoV-2 viral copies this assay can detect is 138 copies/mL. A negative result does not preclude SARS-Cov-2 infection and should not be used as the sole basis for treatment or other patient management decisions. A negative result may occur with  improper specimen collection/handling, submission of specimen other than nasopharyngeal swab, presence of viral mutation(s) within the areas targeted by this assay, and inadequate number of  viral copies(<138 copies/mL). A negative result must be combined with clinical observations, patient history, and epidemiological information. The expected result is Negative.  Fact Sheet for Patients:  BloggerCourse.com  Fact Sheet for Healthcare Providers:  SeriousBroker.it  This test is no t yet approved or cleared by the [place] FDA and  has been authorized for detection and/or diagnosis of SARS-CoV-2 by FDA under an Emergency Use Authorization (EUA). This EUA will remain  in effect (meaning this test can be used) for the duration of the COVID-19 declaration under Section 564(b)(1) of the Act, 21 U.S.C.section 360bbb-3(b)(1), unless the authorization is terminated  or revoked sooner.       Influenza A by PCR NEGATIVE NEGATIVE Final   Influenza B by PCR NEGATIVE NEGATIVE Final    Comment: (NOTE) The Xpert Xpress SARS-CoV-2/FLU/RSV plus  assay is intended as an aid in the diagnosis of influenza from Nasopharyngeal swab specimens and should not be used as a sole basis for treatment. Nasal washings and aspirates are unacceptable for Xpert Xpress SARS-CoV-2/FLU/RSV testing.  Fact Sheet for Patients: BloggerCourse.com  Fact Sheet for Healthcare Providers: SeriousBroker.it  This test is not yet approved or cleared by the [place] FDA and has been authorized for detection and/or diagnosis of SARS-CoV-2 by FDA under an Emergency Use Authorization (EUA). This EUA will remain in effect (meaning this test can be used) for the duration of the COVID-19 declaration under Section 564(b)(1) of the Act, 21 U.S.C. section 360bbb-3(b)(1), unless the authorization is terminated or revoked.  Performed at [facility], [address], [place], [place] [address]   Urine culture     Status: None   Collection Time: 11/28/19  7:22 AM   Specimen: Urine, Random  Result Value Ref Range Status   Specimen Description   Final    URINE, RANDOM Performed at [facility], [address], [place], [place] [address]    Special Requests   Final    NONE Performed at [facility], [address], [place], [place] [address]    Culture   Final    NO GROWTH Performed at [facility], [address] [place], [place] [address]    Report Status 11/29/2019 FINAL  Final  Blood Culture (routine x 2)     Status: Abnormal   Collection Time: 11/28/19  7:40 AM   Specimen: BLOOD  Result Value Ref Range Status   Specimen Description   Final    BLOOD LA Performed at [facility], [address], [place], [place] [address]    Special Requests   Final    BOTTLES DRAWN AEROBIC AND ANAEROBIC Blood Culture adequate volume Performed at [facility], [address] [place], [place] [address]    Culture  Setup Time   Final    Organism ID to  follow AEROBIC BOTTLE ONLY GRAM NEGATIVE RODS CRITICAL RESULT CALLED TO, READ BACK BY AND VERIFIED WITH: MORGAN CUNNINGHAM 11/29/19 AT 1730 BY ACR    Culture KLEBSIELLA PNEUMONIAE (A)  Final   Report Status 12/01/2019 FINAL  Final   Organism ID, Bacteria KLEBSIELLA PNEUMONIAE  Final      Susceptibility   Klebsiella pneumoniae - MIC*    AMPICILLIN RESISTANT Resistant     CEFAZOLIN <=4 SENSITIVE Sensitive     CEFEPIME <=0.12 SENSITIVE Sensitive     CEFTAZIDIME <=1 SENSITIVE Sensitive     CEFTRIAXONE <=0.25 SENSITIVE Sensitive     CIPROFLOXACIN <=0.25 SENSITIVE Sensitive     GENTAMICIN <=  1 SENSITIVE Sensitive     IMIPENEM <=0.25 SENSITIVE Sensitive     TRIMETH/SULFA <=20 SENSITIVE Sensitive     AMPICILLIN/SULBACTAM <=2 SENSITIVE Sensitive     PIP/TAZO <=4 SENSITIVE Sensitive     * KLEBSIELLA PNEUMONIAE  Blood Culture ID Panel (Reflexed)     Status: Abnormal   Collection Time: 11/28/19  7:40 AM  Result Value Ref Range Status   Enterococcus faecalis NOT DETECTED NOT DETECTED Final   Enterococcus Faecium NOT DETECTED NOT DETECTED Final   Listeria monocytogenes NOT DETECTED NOT DETECTED Final   Staphylococcus species NOT DETECTED NOT DETECTED Final   Staphylococcus aureus (BCID) NOT DETECTED NOT DETECTED Final   Staphylococcus epidermidis NOT DETECTED NOT DETECTED Final   Staphylococcus lugdunensis NOT DETECTED NOT DETECTED Final   Streptococcus species NOT DETECTED NOT DETECTED Final   Streptococcus agalactiae NOT DETECTED NOT DETECTED Final   Streptococcus pneumoniae NOT DETECTED NOT DETECTED Final   Streptococcus pyogenes NOT DETECTED NOT DETECTED Final   A.calcoaceticus-baumannii NOT DETECTED NOT DETECTED Final   Bacteroides fragilis NOT DETECTED NOT DETECTED Final   Enterobacterales DETECTED (A) NOT DETECTED Final    Comment: Enterobacterales represent a large order of gram negative bacteria, not a single organism. CRITICAL RESULT CALLED TO, READ BACK BY AND VERIFIED  WITH: MORGAN CUNNINGHAM 11/29/19 AT 1730 BY ACR    Enterobacter cloacae complex NOT DETECTED NOT DETECTED Final   Escherichia coli NOT DETECTED NOT DETECTED Final   Klebsiella aerogenes NOT DETECTED NOT DETECTED Final   Klebsiella oxytoca NOT DETECTED NOT DETECTED Final   Klebsiella pneumoniae DETECTED (A) NOT DETECTED Final    Comment: CRITICAL RESULT CALLED TO, READ BACK BY AND VERIFIED WITH: MORGAN CUNNINGHAM 11/29/19 AT 1730 BY ACR    Proteus species NOT DETECTED NOT DETECTED Final   Salmonella species NOT DETECTED NOT DETECTED Final   Serratia marcescens NOT DETECTED NOT DETECTED Final   Haemophilus influenzae NOT DETECTED NOT DETECTED Final   Neisseria meningitidis NOT DETECTED NOT DETECTED Final   Pseudomonas aeruginosa NOT DETECTED NOT DETECTED Final   Stenotrophomonas maltophilia NOT DETECTED NOT DETECTED Final   Candida albicans NOT DETECTED NOT DETECTED Final   Candida auris NOT DETECTED NOT DETECTED Final   Candida glabrata NOT DETECTED NOT DETECTED Final   Candida krusei NOT DETECTED NOT DETECTED Final   Candida parapsilosis NOT DETECTED NOT DETECTED Final   Candida tropicalis NOT DETECTED NOT DETECTED Final   Cryptococcus neoformans/gattii NOT DETECTED NOT DETECTED Final   CTX-M ESBL NOT DETECTED NOT DETECTED Final   Carbapenem resistance IMP NOT DETECTED NOT DETECTED Final   Carbapenem resistance KPC NOT DETECTED NOT DETECTED Final   Carbapenem resistance NDM NOT DETECTED NOT DETECTED Final   Carbapenem resist OXA 48 LIKE NOT DETECTED NOT DETECTED Final   Carbapenem resistance VIM NOT DETECTED NOT DETECTED Final    Comment: Performed at [facility], [address] [place], [place] [address]  Blood Culture (routine x 2)     Status: None (Preliminary result)   Collection Time: 11/28/19  7:41 AM   Specimen: BLOOD  Result Value Ref Range Status   Specimen Description BLOOD RAC  Final   Special Requests   Final    BOTTLES DRAWN AEROBIC AND ANAEROBIC Blood  Culture adequate volume   Culture   Final    NO GROWTH 3 DAYS Performed at [facility], [address], [place], [place] [address]    Report Status PENDING  Incomplete  Surgical pcr screen  Status: None   Collection Time: 11/30/19  8:20 AM   Specimen: Nasal Mucosa; Nasal Swab  Result Value Ref Range Status   MRSA, PCR NEGATIVE NEGATIVE Final   Staphylococcus aureus NEGATIVE NEGATIVE Final    Comment: (NOTE) The Xpert SA Assay (FDA approved for NASAL specimens in patients 66 years of age and older), is one component of a comprehensive surveillance program. It is not intended to diagnose infection nor to guide or monitor treatment. Performed at [facility], [address], [place], [place] [address]      Time coordinating discharge in minutes: 65  SIGNED:   Calvert Cantor, MD  [facility] 12/01/2019, 2:19 PM

## 2019-12-02 LAB — SURGICAL PATHOLOGY

## 2019-12-03 LAB — CULTURE, BLOOD (ROUTINE X 2)
Culture: NO GROWTH
Special Requests: ADEQUATE

## 2019-12-26 ENCOUNTER — Other Ambulatory Visit: Payer: Self-pay

## 2019-12-26 ENCOUNTER — Emergency Department
Admission: EM | Admit: 2019-12-26 | Discharge: 2019-12-28 | Disposition: A | Discharge status: Home / Self Care | Payer: Managed Care, Other (non HMO) | Attending: Emergency Medicine | Admitting: Emergency Medicine

## 2019-12-26 ENCOUNTER — Encounter: Payer: Self-pay | Admitting: Emergency Medicine

## 2019-12-26 DIAGNOSIS — R1013 Epigastric pain: Secondary | ICD-10-CM | POA: Diagnosis present

## 2019-12-26 DIAGNOSIS — Z5321 Procedure and treatment not carried out due to patient leaving prior to being seen by health care provider: Secondary | ICD-10-CM | POA: Diagnosis not present

## 2019-12-26 LAB — URINALYSIS, COMPLETE (UACMP) WITH MICROSCOPIC
Bacteria, UA: NONE SEEN
Bilirubin Urine: NEGATIVE
Glucose, UA: NEGATIVE mg/dL
Hgb urine dipstick: NEGATIVE
Ketones, ur: NEGATIVE mg/dL
Leukocytes,Ua: NEGATIVE
Nitrite: NEGATIVE
Protein, ur: NEGATIVE mg/dL
Specific Gravity, Urine: 1.03 (ref 1.005–1.030)
pH: 5 (ref 5.0–8.0)

## 2019-12-26 LAB — COMPREHENSIVE METABOLIC PANEL
ALT: 324 U/L — ABNORMAL HIGH (ref 0–44)
AST: 323 U/L — ABNORMAL HIGH (ref 15–41)
Albumin: 3.9 g/dL (ref 3.5–5.0)
Alkaline Phosphatase: 187 U/L — ABNORMAL HIGH (ref 38–126)
Anion gap: 9 (ref 5–15)
BUN: 24 mg/dL — ABNORMAL HIGH (ref 8–23)
CO2: 23 mmol/L (ref 22–32)
Calcium: 8.8 mg/dL — ABNORMAL LOW (ref 8.9–10.3)
Chloride: 105 mmol/L (ref 98–111)
Creatinine, Ser: 0.98 mg/dL (ref 0.61–1.24)
GFR, Estimated: >60 mL/min (ref >60)
Glucose, Bld: 159 mg/dL — ABNORMAL HIGH (ref 70–99)
Potassium: 3.6 mmol/L (ref 3.5–5.1)
Sodium: 137 mmol/L (ref 135–145)
Total Bilirubin: 2 mg/dL — ABNORMAL HIGH (ref 0.3–1.2)
Total Protein: 7 g/dL (ref 6.5–8.1)

## 2019-12-26 LAB — CBC
HCT: 41.5 % (ref 39.0–52.0)
Hemoglobin: 14 g/dL (ref 13.0–17.0)
MCH: 30.5 pg (ref 26.0–34.0)
MCHC: 33.7 g/dL (ref 30.0–36.0)
MCV: 90.4 fL (ref 80.0–100.0)
Platelets: 201 10*3/uL (ref 150–400)
RBC: 4.59 MIL/uL (ref 4.22–5.81)
RDW: 12.5 % (ref 11.5–15.5)
WBC: 9.6 10*3/uL (ref 4.0–10.5)
nRBC: 0 % (ref 0.0–0.2)

## 2019-12-26 LAB — TROPONIN I (HIGH SENSITIVITY): Troponin I (High Sensitivity): 15 ng/L (ref <18)

## 2019-12-26 LAB — LIPASE, BLOOD: Lipase: 99 U/L — ABNORMAL HIGH (ref 11–51)

## 2019-12-26 NOTE — ED Triage Notes (Signed)
Pt reports epigastric pain, sharp pain. Pt reports came to ER weeks ago for same symptoms and got gallbladder removed. Pt reports this evening symptoms came back, reports took Oxycodone at 6pm and took edge off. Pt denies any nausea, vomiting or diarrhea. Pt talks in complete sentences no distress noted

## 2019-12-26 NOTE — ED Notes (Signed)
Pt no longer visualized in lobby, called pt x1 without response.

## 2020-01-13 ENCOUNTER — Other Ambulatory Visit: Payer: Self-pay

## 2020-01-13 ENCOUNTER — Ambulatory Visit: Payer: Managed Care, Other (non HMO) | Admitting: Cardiology

## 2020-01-13 ENCOUNTER — Encounter: Payer: Self-pay | Admitting: Cardiology

## 2020-01-13 VITALS — BP 114/86 | HR 66 | Ht 74.0 in | Wt 241.0 lb

## 2020-01-13 DIAGNOSIS — I5022 Chronic systolic (congestive) heart failure: Secondary | ICD-10-CM | POA: Diagnosis not present

## 2020-01-13 DIAGNOSIS — Z95810 Presence of automatic (implantable) cardiac defibrillator: Secondary | ICD-10-CM

## 2020-01-13 DIAGNOSIS — I25110 Atherosclerotic heart disease of native coronary artery with unstable angina pectoris: Secondary | ICD-10-CM

## 2020-01-13 DIAGNOSIS — I4891 Unspecified atrial fibrillation: Secondary | ICD-10-CM | POA: Diagnosis not present

## 2020-01-13 NOTE — Patient Instructions (Signed)
Medication Instructions:  - Your physician recommends that you continue on your current medications as directed. Please refer to the Current Medication list given to you today.  *If you need a refill on your cardiac medications before your next appointment, please call your pharmacy*   Lab Work: - none ordered  If you have labs (blood work) drawn today and your tests are completely normal, you will receive your results only by: Marland Kitchen MyChart Message (if you have MyChart) OR . A paper copy in the mail If you have any lab test that is abnormal or we need to change your treatment, we will call you to review the results.   Testing/Procedures: - none ordered   Follow-Up: At [facility], you and your health needs are our priority.  As part of our continuing mission to provide you with exceptional heart care, we have created designated Provider Care Teams.  These Care Teams include your primary Cardiologist (physician) and Advanced Practice Providers (APPs -  Physician Assistants and Nurse Practitioners) who all work together to provide you with the care you need, when you need it.  We recommend signing up for the patient portal called "MyChart".  Sign up information is provided on this After Visit Summary.  MyChart is used to connect with patients for Virtual Visits (Telemedicine).  Patients are able to view lab/test results, encounter notes, upcoming appointments, etc.  Non-urgent messages can be sent to your provider as well.   To learn more about what you can do with MyChart, go to [email or web address].    Your next appointment:   1 year(s)  The format for your next appointment:   In Person  Provider:   Steffanie Dunn, MD   Other Instructions - [facility]- Device [facility] [phone]

## 2020-01-13 NOTE — Progress Notes (Signed)
Electrophysiology Office Note:    Date:  01/13/2020   ID:  Shawn Elliott, DOB 1955-04-10, MRN [id number]  PCP:  Manfred Arch, MD  [facility] Cardiologist:  Vita Erm, MD  [facility] Electrophysiologist:  Lanier Prude, MD   Referring MD: Manfred Arch,*   Chief Complaint: ICD in situ, history of atrial fibrillation  History of Present Illness:    Shawn Elliott is a 65 y.o. male who presents for an evaluation of ischemic cardiomyopathy and a history of atrial fibrillation at the request of Benita Gutter. Their medical history includes coronary artery disease post four-vessel CABG, obesity, hyperlipidemia, cholecystitis and sepsis.  He is presenting to my clinic today to establish care.  He was previously followed by [facility].  He was recently admitted to the hospital from November 20 through December 01, 2019 for cholecystitis and sepsis.  He underwent a cholecystectomy during that hospitalization on November 30, 2019.  Since his surgery he has been doing well. He has a history of an ICD that was placed in the setting of ischemic cardiomyopathy.  The Medtronic ICD was placed in November 2019 and he was previously followed by [facility].  He has never had ICD shock.  He is on good medical therapy for his ischemic cardiomyopathy.  He has had trouble with his remote monitoring due to a failed home monitoring device.  Past Medical History:  Diagnosis Date  . Coronary artery disease   . GERD (gastroesophageal reflux disease)   . High cholesterol   . History of kidney stones    "passed them" (01/17/2016)  . Hypertension   . PONV (postoperative nausea and vomiting)    "when I woke up from my hernia I was nauseated"  . STEMI (ST elevation myocardial infarction) (HCC) 01/12/2016    Past Surgical History:  Procedure Laterality Date  . CARDIAC CATHETERIZATION N/A 01/12/2016   Procedure: Left Heart Cath and Coronary Angiography;  Surgeon: Lennette Bihari, MD;   Location: [facility] INVASIVE CV LAB;  Service: Cardiovascular;  Laterality: N/A;  . CARDIAC CATHETERIZATION N/A 01/12/2016   Procedure: Coronary Stent Intervention;  Surgeon: Lennette Bihari, MD;  Location: [facility] INVASIVE CV LAB;  Service: Cardiovascular;  Laterality: N/A;  . CARDIAC CATHETERIZATION N/A 01/17/2016   Procedure: Coronary Stent Intervention;  Surgeon: Lennette Bihari, MD;  Location: [facility] INVASIVE CV LAB;  Service: Cardiovascular;  Laterality: N/A;  . CARDIAC CATHETERIZATION N/A 01/17/2016   Procedure: Coronary Balloon Angioplasty;  Surgeon: Lennette Bihari, MD;  Location: [facility] INVASIVE CV LAB;  Service: Cardiovascular;  Laterality: N/A;  . CHOLECYSTECTOMY N/A 11/30/2019   Procedure: LAPAROSCOPIC CHOLECYSTECTOMY;  Surgeon: Andria Meuse, MD;  Location: [facility] OR;  Service: General;  Laterality: N/A;  . COLONOSCOPY  ~ 2008  . CORONARY ANGIOPLASTY WITH STENT PLACEMENT  01/17/2016   "3 01/12/2016; 3 01/17/2016"  . GANGLION CYST EXCISION Right ~ 2016  . INGUINAL HERNIA REPAIR Right ~ 2014  . kidney stones  2010    Current Medications: Current Meds  Medication Sig  . aspirin EC 81 MG tablet Take 81 mg by mouth daily.  Marland Kitchen atorvastatin (LIPITOR) 80 MG tablet Take 80 mg by mouth daily.  Marland Kitchen ezetimibe (ZETIA) 10 MG tablet Take 10 mg by mouth daily.  . fluocinonide cream (LIDEX) 0.05 % Apply 1 application topically daily as needed (topical allergic reactions/itching).   Marland Kitchen lisinopril (PRINIVIL,ZESTRIL) 5 MG tablet Take 5 mg by mouth daily.  . metoprolol succinate (TOPROL-XL) 100 MG 24 hr tablet  Take 100 mg by mouth daily. Take with or immediately following a meal.  . Multiple Vitamin (MULTIVITAMIN WITH MINERALS) TABS tablet Take 1 tablet by mouth daily.  . nitroGLYCERIN (NITROSTAT) 0.4 MG SL tablet Place 1 tablet (0.4 mg total) under the tongue every 5 (five) minutes as needed for chest pain.  . Omega-3 Fatty Acids (FISH OIL TRIPLE STRENGTH) 1400 MG CAPS Take 1,400 mg by mouth every morning.  Marland Kitchen omeprazole  (PRILOSEC) 20 MG capsule Take 20 mg by mouth daily.  Marland Kitchen spironolactone (ALDACTONE) 25 MG tablet Take 25 mg by mouth daily.     Allergies:   Penicillins   Social History   Socioeconomic History  . Marital status: Married    Spouse name: Not on file  . Number of children: Not on file  . Years of education: Not on file  . Highest education level: Not on file  Occupational History  . Not on file  Tobacco Use  . Smoking status: Former Smoker    Types: Pipe    Quit date: 1982    Years since quitting: 40.0  . Smokeless tobacco: Never Used  Vaping Use  . Vaping Use: Never used  Substance and Sexual Activity  . Alcohol use: Yes    Alcohol/week: 1.0 standard drink    Types: 1 Cans of beer per week    Comment: occassionally  . Drug use: No  . Sexual activity: Not Currently  Other Topics Concern  . Not on file  Social History Narrative  . Not on file   Social Determinants of Health   Financial Resource Strain: Not on file  Food Insecurity: Not on file  Transportation Needs: Not on file  Physical Activity: Not on file  Stress: Not on file  Social Connections: Not on file     Family History: The patient's family history includes Diabetes in his brother, father, and mother; Heart attack in his mother; Hyperlipidemia in his brother, father, mother, and sister; Hypertension in his brother, father, mother, and sister; Stroke in his father and mother.  ROS:   Please see the history of present illness.    All other systems reviewed and are negative.  EKGs/Labs/Other Studies Reviewed:    The following studies were reviewed today:  January 13, 2020 device interrogation personally reviewed Presenting rhythm atrial paced, ventricular sensed Longevity 7.5 years AAIR to DDDR 60-1 30 Atrial lead impedance 437 ohms, capture threshold 0.65 V at 0.4 ms P waves 3.4 mV Right ventricular lead impedance 437 ohms, capture threshold 0.65 at 0.4 ms, R waves 13.3 mV No atrial fibrillation  noted since his hospitalization No delivered therapies   EKG:  The ekg ordered today demonstrates atrial paced, ventricular sensed  Recent Labs: 12/26/2019: ALT 324; BUN 24; Creatinine, Ser 0.98; Hemoglobin 14.0; Platelets 201; Potassium 3.6; Sodium 137  Recent Lipid Panel    Component Value Date/Time   CHOL 111 04/20/2016 1002   TRIG 107 04/20/2016 1002   HDL 34 (L) 04/20/2016 1002   CHOLHDL 3.3 04/20/2016 1002   CHOLHDL 3.9 03/17/2016 0133   VLDL 22 03/17/2016 0133   LDLCALC 56 04/20/2016 1002    Physical Exam:    VS:  BP 114/86 (BP Location: Left Arm, Patient Position: Sitting, Cuff Size: Large)   Pulse 66   Ht 6\' 2"  (1.88 m)   Wt 241 lb (109.3 kg)   SpO2 98%   BMI 30.94 kg/m     Wt Readings from Last 3 Encounters:  01/13/20 241 lb (109.3  kg)  12/26/19 240 lb (108.9 kg)  12/01/19 242 lb 4.6 oz (109.9 kg)     GEN:  Well nourished, well developed in no acute distress HEENT: Normal NECK: No JVD; No carotid bruits LYMPHATICS: No lymphadenopathy CARDIAC: RRR, no murmurs, rubs, gallops.  Pacemaker pocket well-healed RESPIRATORY:  Clear to auscultation without rales, wheezing or rhonchi  ABDOMEN: Soft, non-tender, non-distended MUSCULOSKELETAL:  No edema; No deformity  SKIN: Warm and dry NEUROLOGIC:  Alert and oriented x 3 PSYCHIATRIC:  Normal affect   ASSESSMENT:    1. Chronic systolic heart failure (HCC)   2. Atrial fibrillation, unspecified type (HCC)   3. Coronary artery disease involving native coronary artery of native heart with unstable angina pectoris (HCC)   4. ICD (implantable cardioverter-defibrillator) in place    PLAN:    In order of problems listed above:  1. Chronic systolic heart failure secondary to ischemic cardiomyopathy Left ventricular function 35% on November 29, 2019 echo.  NYHA class II symptoms today. Continue aspirin, lisinopril, metoprolol, atorvastatin, spironolactone, Zetia.  2.  Atrial fibrillation Seems to have been an  isolated episode in the setting of severe sepsis and cholecystitis.  No recurrence since discharge from the hospital.  He is not currently taking aspirin.  We discussed the stroke risk associated with paroxysmal atrial fibrillation.  I think it is likely that this was a isolated episode of A. fib in the setting of acute illness but I would like to make sure we have his remote monitoring intact to monitor for recurrent episodes of atrial fibrillation that would necessitate systemic anticoagulation.  I have had the [employer] representative reorder his remote monitor for home and we have activated atrial fibrillation alerts.  3.  Coronary artery disease post CABG No ischemic symptoms today Continue medical therapy as above  4.  ICD in situ Device well-functioning on today's interrogation.  We will establish him for remote monitoring in our clinic.  Plan to see him back in 1 year or sooner as needed.   Medication Adjustments/Labs and Tests Ordered: Current medicines are reviewed at length with the patient today.  Concerns regarding medicines are outlined above.  Orders Placed This Encounter  Procedures  . EKG 12-Lead   No orders of the defined types were placed in this encounter.    Signed, Steffanie Dunn, MD, [facility]  01/13/2020 7:23 PM    Electrophysiology [facility]

## 2020-01-19 ENCOUNTER — Telehealth: Payer: Self-pay | Admitting: Emergency Medicine

## 2020-01-19 NOTE — Telephone Encounter (Signed)
Message left with [facility] to contact DC with #  Provided to transfer patient to this practice.

## 2020-01-19 NOTE — Telephone Encounter (Signed)
-----   Message from Jefferey Pica, RN sent at 01/13/2020 11:40 AM EST ----- Patient seen in the [facility] today by Dr. Lalla Brothers to establish Device Care.  Medtronic ICD  1) Need to request a transfer from Dr. Eduard Roux -- [facility]  2) He was also having issues with his home monitor so Richrd Sox said to let you know she ordered him another box for home today.   Thanks! Herbert Seta

## 2020-02-04 ENCOUNTER — Telehealth: Payer: Self-pay | Admitting: Cardiology

## 2020-02-04 NOTE — Telephone Encounter (Signed)
Return pt's call, concern for CP, pt reports at 9am he started to experience CP to right upper chest, above right breast. Pt reports sitting on couch drinking coffee when it started, no radiation, no shob, denies, dizziness or other s/s. Did not take any nitor, took regular morning meds. Reports unsure of reflux, had a "heavy tomato based casserole with cabbage" last night, took his omeprazole 20 mg around 9 am this morning. Reports CP has eased off rates 0.5/10 pain at current. Pt has a ICD, transmitted a recording to the [facility] location for an evaluation around 10:05am. Has not heard from them with results, this RN send a secure message to the [facility] device pool as an FYI and to follow-up w/pt. Pt reports is feeling "much better" at this time, has no other concerns or questions, would like a call back regarding his transmission to make sure everything is ok. Advised someone will reach back out to him with results. Pt verbalized understanding. Advised pt that if CP returns, if any new or worsening s/s, could try his nitor, send another transmission and to call the clinic back for further advice. Again, pt verbalized understanding, nothing further at this time.

## 2020-02-04 NOTE — Telephone Encounter (Signed)
Spoke with pt advised that transmission has been received and reviewed.  Normal device function with no noted arrythmias.   Nothing showing in transmission that would explain patient symptoms.    Advised patient that I have scheduled future remote checks for every 90 days starting on 04/12/20.  Pt questioned if he will be contacted to schedule a general cardiology appt with Dr. Antoine Poche.  I advised I am not aware of how that is requested and will send annote back to nurse in [place].

## 2020-02-04 NOTE — Telephone Encounter (Signed)
Pt c/o of Chest Pain: STAT if CP now or developed within 24 hours  1. Are you having CP right now? yes  2. Are you experiencing any other symptoms (ex. SOB, nausea, vomiting, sweating)? no  3. How long have you been experiencing CP? 1 hour  4. Is your CP continuous or coming and going? Continuous, high in right breast area   5. Have you taken Nitroglycerin? no ? Patient unsure if this heart burn or no  Patient is going to send transmission while he waits for call back

## 2020-02-04 NOTE — Telephone Encounter (Signed)
Spoke with patient and offered appointment for tomorrow, he declined. Patient stated he was feeling fine and what he felt was just heartburn.  Scheduled appointment for 2/22 and advised to call back if any issues prior to then

## 2020-02-04 NOTE — Telephone Encounter (Signed)
Spoke with [employer]  Patient has been trasnferred in.  Confirmed transmission that was sent this morning.

## 2020-02-26 ENCOUNTER — Ambulatory Visit (INDEPENDENT_AMBULATORY_CARE_PROVIDER_SITE_OTHER): Payer: Managed Care, Other (non HMO)

## 2020-02-26 DIAGNOSIS — I4891 Unspecified atrial fibrillation: Secondary | ICD-10-CM | POA: Diagnosis not present

## 2020-02-26 LAB — CUP PACEART REMOTE DEVICE CHECK
Battery Remaining Longevity: 86 mo
Battery Voltage: 2.99 V
Brady Statistic AP VP Percent: 0.04 %
Brady Statistic AP VS Percent: 72.27 %
Brady Statistic AS VP Percent: 0.01 %
Brady Statistic AS VS Percent: 27.68 %
Brady Statistic RA Percent Paced: 70.63 %
Brady Statistic RV Percent Paced: 0.05 %
Date Time Interrogation Session: 20220218042507
HighPow Impedance: 60 Ohm
Lead Channel Impedance Value: 323 Ohm
Lead Channel Impedance Value: 399 Ohm
Lead Channel Impedance Value: 437 Ohm
Lead Channel Pacing Threshold Amplitude: 0.625 V
Lead Channel Pacing Threshold Amplitude: 0.75 V
Lead Channel Pacing Threshold Pulse Width: 0.4 ms
Lead Channel Pacing Threshold Pulse Width: 0.4 ms
Lead Channel Sensing Intrinsic Amplitude: 13.25 mV
Lead Channel Sensing Intrinsic Amplitude: 13.25 mV
Lead Channel Sensing Intrinsic Amplitude: 2.875 mV
Lead Channel Sensing Intrinsic Amplitude: 2.875 mV
Lead Channel Setting Pacing Amplitude: 1.5 V
Lead Channel Setting Pacing Amplitude: 2 V
Lead Channel Setting Pacing Pulse Width: 0.4 ms
Lead Channel Setting Sensing Sensitivity: 0.3 mV

## 2020-02-28 NOTE — Progress Notes (Signed)
Cardiology Office Note   Date:  03/01/2020   ID:  Shawn Elliott, Shawn Elliott 18-Mar-1955, MRN [id number]  PCP:  Jerl Mina, MD  Cardiologist:   Vita Erm, MD   Chief Complaint  Patient presents with  . Coronary Artery Disease      History of Present Illness: Shawn Elliott is a 65 y.o. 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   He has an ischemic cardiomyopathy and has an ICD.  He has had atrial fib.     Since I saw him in the hospital at the time gallbladder surgery he has been doing well.  He did have one episode of chest discomfort right before Valentine's Day.  He took a nitroglycerin.  This came out of the blue.  Is a vague uncomfortable discomfort.  He has not had any symptoms since then.  He does do walking for exercise.  He gets his heart rate up to 120 or so and he has not had any recurrent symptoms.  He otherwise denies chest pressure, neck or arm discomfort.  Has had no new shortness of breath, PND or orthopnea.  He has had no palpitations, presyncope or syncope.  He has had no weight gain or edema.  Past Medical History:  Diagnosis Date  . Coronary artery disease   . GERD (gastroesophageal reflux disease)   . High cholesterol   . History of kidney stones    "passed them" (01/17/2016)  . Hypertension   . PONV (postoperative nausea and vomiting)    "when I woke up from my hernia I was nauseated"  . STEMI (ST elevation myocardial infarction) (HCC) 01/12/2016    Past Surgical History:  Procedure Laterality Date  .  CARDIAC CATHETERIZATION N/A 01/12/2016   Procedure: Left Heart Cath and Coronary Angiography;  Surgeon: Lennette Bihari, MD;  Location: [facility] INVASIVE CV LAB;  Service: Cardiovascular;  Laterality: N/A;  . CARDIAC CATHETERIZATION N/A 01/12/2016   Procedure: Coronary Stent Intervention;  Surgeon: Lennette Bihari, MD;  Location: [facility] INVASIVE CV LAB;  Service: Cardiovascular;  Laterality: N/A;  . CARDIAC CATHETERIZATION N/A 01/17/2016   Procedure: Coronary Stent Intervention;  Surgeon: Lennette Bihari, MD;  Location: [facility] INVASIVE CV LAB;  Service: Cardiovascular;  Laterality: N/A;  . CARDIAC CATHETERIZATION N/A 01/17/2016   Procedure: Coronary Balloon Angioplasty;  Surgeon: Lennette Bihari, MD;  Location: [facility] INVASIVE CV LAB;  Service: Cardiovascular;  Laterality: N/A;  . CHOLECYSTECTOMY N/A 11/30/2019   Procedure: LAPAROSCOPIC CHOLECYSTECTOMY;  Surgeon: Andria Meuse, MD;  Location: [facility] OR;  Service: General;  Laterality: N/A;  . COLONOSCOPY  ~ 2008  . CORONARY ANGIOPLASTY WITH STENT PLACEMENT  01/17/2016   "3 01/12/2016; 3 01/17/2016"  . GANGLION CYST EXCISION Right ~ 2016  . INGUINAL HERNIA REPAIR Right ~ 2014  . kidney stones  2010     Current Outpatient Medications  Medication Sig Dispense Refill  . aspirin EC 81 MG tablet Take 81 mg by mouth daily.    Marland Kitchen atorvastatin (LIPITOR) 80 MG tablet Take 80 mg by mouth daily.    Marland Kitchen  ezetimibe (ZETIA) 10 MG tablet Take 10 mg by mouth daily.    . fluocinonide cream (LIDEX) 0.05 % Apply 1 application topically daily as needed (topical allergic reactions/itching).     . metoprolol succinate (TOPROL-XL) 100 MG 24 hr tablet Take 100 mg by mouth daily. Take with or immediately following a meal.    . Multiple Vitamin (MULTIVITAMIN WITH MINERALS) TABS tablet Take 1 tablet by mouth daily.    . nitroGLYCERIN (NITROSTAT) 0.4 MG SL tablet Place 1 tablet (0.4 mg total) under the tongue every 5 (five) minutes as needed for chest pain. 25 tablet 2  . Omega-3 Fatty Acids (FISH OIL  TRIPLE STRENGTH) 1400 MG CAPS Take 1,400 mg by mouth every morning.    Marland Kitchen omeprazole (PRILOSEC) 20 MG capsule Take 20 mg by mouth daily.    Marland Kitchen spironolactone (ALDACTONE) 25 MG tablet Take 25 mg by mouth daily.    Marland Kitchen lisinopril (ZESTRIL) 5 MG tablet Take 1 tablet (5 mg total) by mouth in the morning and at bedtime. 180 tablet 3   No current facility-administered medications for this visit.    Allergies:   Penicillins     ROS:  Please see the history of present illness.   Otherwise, review of systems are positive for none.   All other systems are reviewed and negative.    PHYSICAL EXAM: VS:  BP 134/86   Pulse 65   Ht 6\' 2"  (1.88 m)   Wt 245 lb (111.1 kg)   SpO2 99%   BMI 31.46 kg/m  , BMI Body mass index is 31.46 kg/m. GENERAL:  Well appearing HEENT:  Pupils equal round and reactive, fundi not visualized, oral mucosa unremarkable NECK:  No jugular venous distention, waveform within normal limits, carotid upstroke brisk and symmetric, questionable carotid bruits, no thyromegaly LYMPHATICS:  No cervical, inguinal adenopathy LUNGS:  Clear to auscultation bilaterally BACK:  No CVA tenderness CHEST:  Well healed sternotomy scar. ICD pocket.   HEART:  PMI not displaced or sustained,S1 and S2 within normal limits, no S3, no S4, no clicks, no rubs, no murmurs ABD:  Flat, positive bowel sounds normal in frequency in pitch, no bruits, no rebound, no guarding, no midline pulsatile mass, no hepatomegaly, no splenomegaly EXT:  2 plus pulses throughout, no edema, no cyanosis no clubbing   EKG:  EKG is ordered today. The ekg ordered today demonstrates atrial paced rhythm rate 65, interventricular conduction delay, old inferior infarct   Recent Labs: 12/26/2019: ALT 324; BUN 24; Creatinine, Ser 0.98; Hemoglobin 14.0; Platelets 201; Potassium 3.6; Sodium 137    Lipid Panel    Component Value Date/Time   CHOL 111 04/20/2016 1002   TRIG 107 04/20/2016 1002   HDL 34 (L) 04/20/2016 1002    CHOLHDL 3.3 04/20/2016 1002   CHOLHDL 3.9 03/17/2016 0133   VLDL 22 03/17/2016 0133   LDLCALC 56 04/20/2016 1002      Wt Readings from Last 3 Encounters:  03/01/20 245 lb (111.1 kg)  01/13/20 241 lb (109.3 kg)  12/26/19 240 lb (108.9 kg)      Other studies Reviewed: Additional studies/ records that were reviewed today include: Care Everywhere. Review of the above records demonstrates:  Please see elsewhere in the note.     ASSESSMENT AND PLAN:  ELEVATED TROPONIN:   This was felt to be related to demand ischemia.   He has had no new symptoms except one vague episode of chest discomfort and he is exerting himself routinely.  I do  not think he is having unstable symptoms and does not require further imaging but needs continued aggressive risk reduction as below.  CAD:   No further testing is indicated.  ISCHEMIC CARDIOMYOPATHY:   I am going to titrate his ACE inhibitor to 5 mg twice daily lisinopril.  Slowly titrate as his blood pressure allows.   PAF:    There has been no recurrence.  He saw Dr. Lalla Brothers last month and he is to have home monitoring with atrial fib alerts activated.  No indication for anticoagulation  BRUIT: I will order carotid Dopplers.  DYSLIPIDEMIA: His last LDL was actually 91.  He was started on Zetia since then.  He is going to get fasting lipid profiles with a goal LDL less than 70 and ideally in the 50s.  We discussed this.  ICD: He is up-to-date with follow-up.  I did review this with him and went back to [facility] records.  He is a sensed V sensed 25% of the time and otherwise he is a paced V sensed.   Current medicines are reviewed at length with the patient today.  The patient does not have concerns regarding medicines.  The following changes have been made: As above  Labs/ tests ordered today include:   Orders Placed This Encounter  Procedures  . EKG 12-Lead  . VAS US CAROTID     Disposition:   FU with me in 3 months.     Signed, Rollene Rotunda, MD  03/01/2020 3:05 PM    [facility]

## 2020-02-29 NOTE — Progress Notes (Signed)
Remote pacemaker transmission.   

## 2020-03-01 ENCOUNTER — Encounter: Payer: Self-pay | Admitting: Cardiology

## 2020-03-01 ENCOUNTER — Ambulatory Visit: Payer: Managed Care, Other (non HMO) | Admitting: Cardiology

## 2020-03-01 ENCOUNTER — Other Ambulatory Visit: Payer: Self-pay

## 2020-03-01 VITALS — BP 134/86 | HR 65 | Ht 74.0 in | Wt 245.0 lb

## 2020-03-01 DIAGNOSIS — R778 Other specified abnormalities of plasma proteins: Secondary | ICD-10-CM

## 2020-03-01 DIAGNOSIS — R0989 Other specified symptoms and signs involving the circulatory and respiratory systems: Secondary | ICD-10-CM

## 2020-03-01 DIAGNOSIS — I480 Paroxysmal atrial fibrillation: Secondary | ICD-10-CM | POA: Diagnosis not present

## 2020-03-01 DIAGNOSIS — I255 Ischemic cardiomyopathy: Secondary | ICD-10-CM

## 2020-03-01 MED ORDER — LISINOPRIL 5 MG PO TABS
5.0000 mg | ORAL_TABLET | Freq: Two times a day (BID) | ORAL | 3 refills | Status: DC
Start: 2020-03-01 — End: 2020-05-27

## 2020-03-01 NOTE — Patient Instructions (Signed)
Medication Instructions:  Increase Lisinopril to 5mg , take 1 tablet twice a day *If you need a refill on your cardiac medications before your next appointment, please call your pharmacy*  Testing/Procedures: Your physician has requested that you have a carotid duplex. This test is an ultrasound of the carotid arteries in your neck. It looks at blood flow through these arteries that supply the brain with blood. Allow one hour for this exam. There are no restrictions or special instructions.  Follow-Up: At [facility], you and your health needs are our priority.  As part of our continuing mission to provide you with exceptional heart care, we have created designated Provider Care Teams.  These Care Teams include your primary Cardiologist (physician) and Advanced Practice Providers (APPs -  Physician Assistants and Nurse Practitioners) who all work together to provide you with the care you need, when you need it.    Your next appointment:   3 month(s)  The format for your next appointment:   In Person  Provider:   [facility], MD

## 2020-03-07 ENCOUNTER — Ambulatory Visit (HOSPITAL_COMMUNITY)
Admission: RE | Admit: 2020-03-07 | Discharge: 2020-03-07 | Disposition: A | Discharge status: Home / Self Care | Payer: Managed Care, Other (non HMO) | Source: Ambulatory Visit | Attending: Internal Medicine | Admitting: Internal Medicine

## 2020-03-07 ENCOUNTER — Other Ambulatory Visit: Payer: Self-pay

## 2020-03-07 DIAGNOSIS — R0989 Other specified symptoms and signs involving the circulatory and respiratory systems: Secondary | ICD-10-CM | POA: Insufficient documentation

## 2020-05-11 IMAGING — CT CT SHOULDER*L* W/CM
2 series · 10 of 14 positions shown, 12 images · IV contrast (agent unspecified)
Comparison: None.

CLINICAL DATA: Acute on chronic left shoulder pain since fall a
month and a half ago. No prior surgery.

EXAM:
CT OF THE UPPER LEFT EXTREMITY WITH CONTRAST (CT ARTHROGRAM)
TECHNIQUE: Multidetector CT imaging of the left shoulder was performed
according to the standard protocol following intra-articular
contrast administration.
CONTRAST:  See injection documentation.

[Series 2: bone · axial · 0.58mm/px · z∈[-191,-47]mm · 5 of 108 slices shown, 7 images]
[im 18/108  soft-tissue]
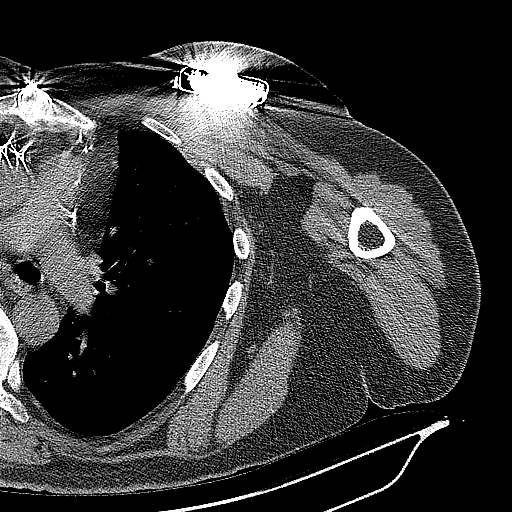
[im 18/108  bone]
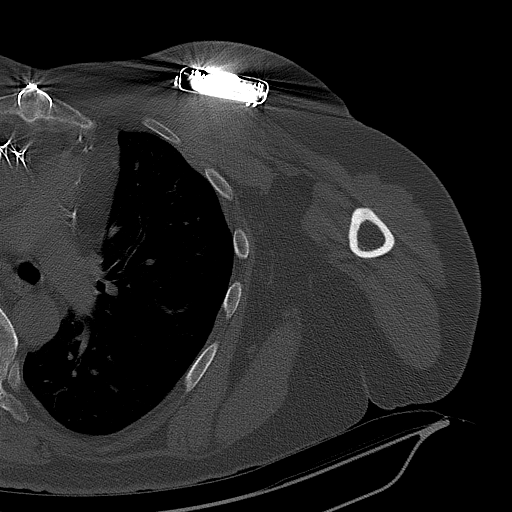
[im 36/108  bone]
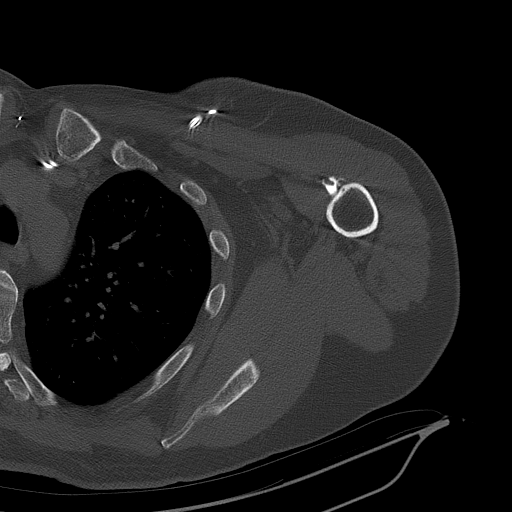
[im 54/108  bone]
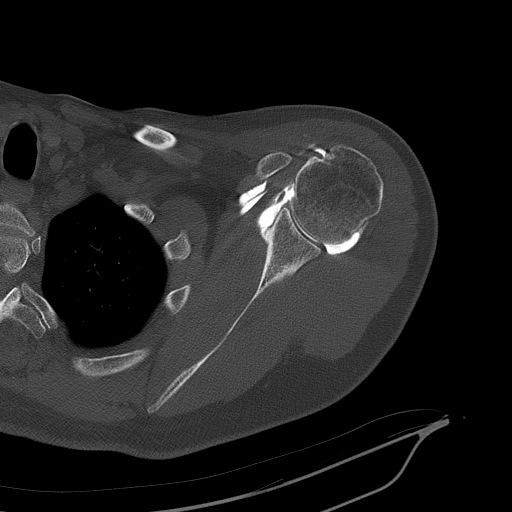
[im 72/108  bone]
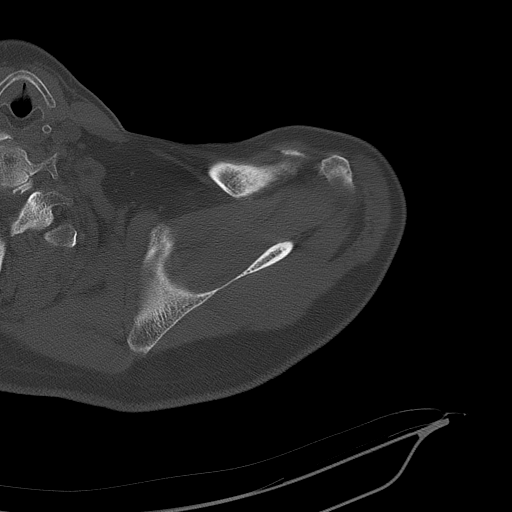
[im 90/108  soft-tissue]
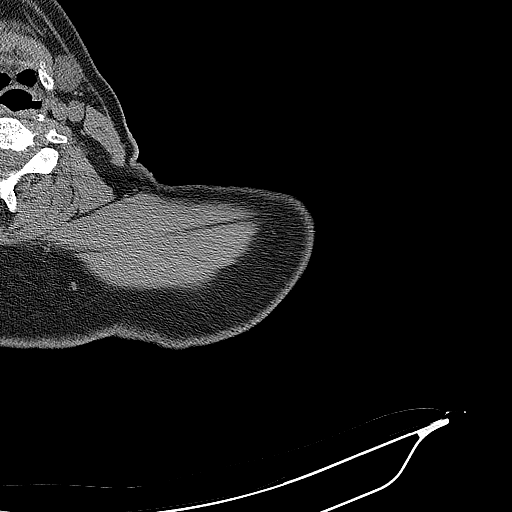
[im 90/108  bone]
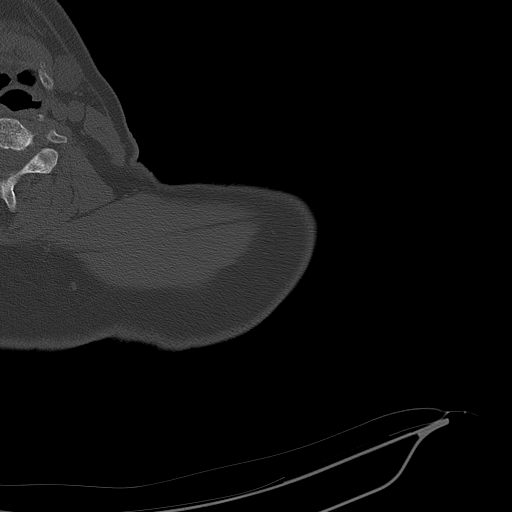

[Series 3: soft tissue (person_name) · axial · 0.58mm/px · z∈[-187,-47]mm · 5 of 106 slices shown]
[im 18/106  soft-tissue]
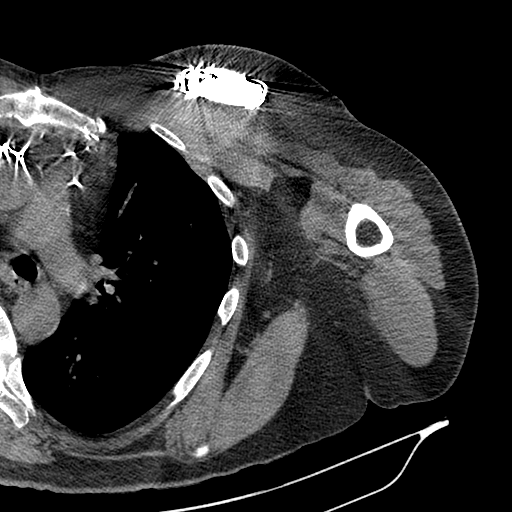
[im 36/106  soft-tissue]
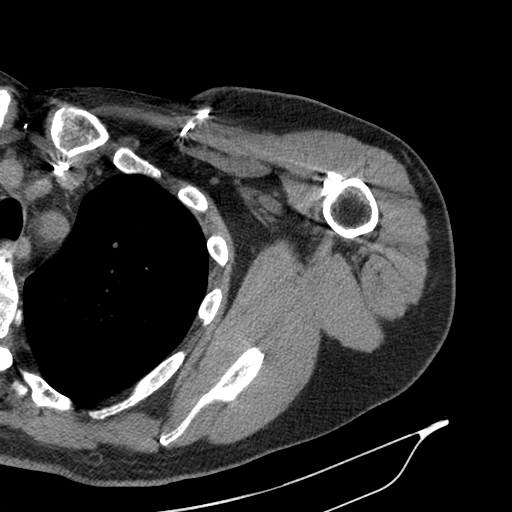
[im 53/106  soft-tissue]
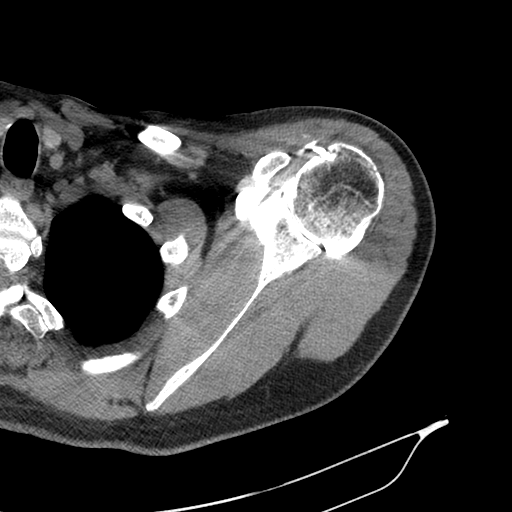
[im 71/106  soft-tissue]
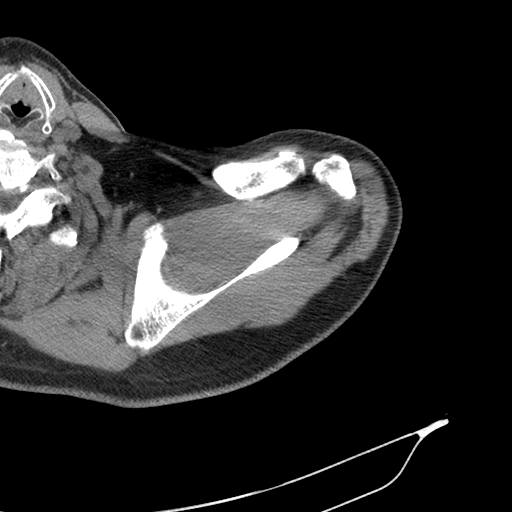
[im 88/106  soft-tissue]
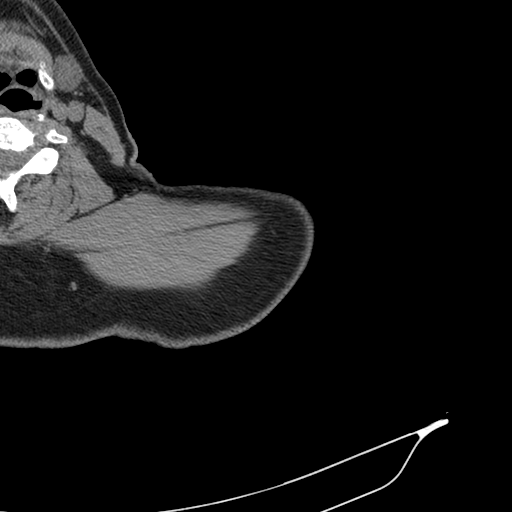

[10 of 14 positions shown; findings below may reference images not displayed]

FINDINGS: Rotator cuff:  Intact.

Muscles:  No muscle atrophy.

Biceps long head:  Intact and normally positioned.

Acromioclavicular Joint: Type I, lateral downsloping acromion with
subacromial spurring. There are mild acromioclavicular degenerative
changes. No significant fluid or contrast is present in the
subacromial/subdeltoid bursa.

Glenohumeral Joint: Distended with intra-articular contrast. No
chondral defect.

Labrum: Tiny focus of contrast extending into the superior labrum
(series 6, images 49-50), consistent with tear. Remaining labrum is
intact.

Bones: No acute or significant extra-articular osseous findings.

Other: Left chest wall pacemaker noted. Prior CABG. The visualized
left lung is clear.
IMPRESSION: 1. Intact rotator cuff.
2. Tiny superior labral tear.
3. Mild acromioclavicular degenerative changes. Lateral downsloping
acromion with subacromial spurring, predisposing to impingement.

## 2020-05-26 ENCOUNTER — Ambulatory Visit: Payer: Managed Care, Other (non HMO) | Admitting: Cardiology

## 2020-05-26 DIAGNOSIS — Z4502 Encounter for adjustment and management of automatic implantable cardiac defibrillator: Secondary | ICD-10-CM | POA: Insufficient documentation

## 2020-05-26 NOTE — Progress Notes (Signed)
Cardiology Office Note   Date:  05/27/2020   ID:  [name], DOB 01/21/1955, MRN [id number]  PCP:  Jerl Mina, MD  Cardiologist:   Vita Erm, MD   Chief Complaint  Patient presents with  . Cardiomyopathy      History of Present Illness: Shawn Elliott is a 65 y.o. male with significant for CAD w/ inferior STEMI s/p PCI w/ DESx1 to [facility] and DESx2 to dRCA extending to area just proximal to PDA, (01/12/2016), 01/17/2016: PTCA DESx1 to mLAD, post dilatation dissection to septal perforator w/ tandem DESz1 to septal perforator, DESx1 to p-mLCX, CAD w/ CABGx4 (07/25/2012) [LIMA ->dLAD, SVG ->D1, SVG ->RI, SVG ->PLB], ischemic HFrEF w/ EF 35% (08/17/18) s/p Medtronic ICD (11/2017), combined systolic/diastolic HF, HTN, HLD, obesity, HTN, post cath A.fib 01/2016.   He has an ischemic cardiomyopathy and has an ICD.  He has had atrial fib.     Since I saw him he has done well.  He is going to be at the [place] for his primary care.  The patient denies any new symptoms such as chest discomfort, neck or arm discomfort. There has been no new shortness of breath, PND or orthopnea. There have been no reported palpitations, presyncope or syncope.    Of note he mentions to me that some of his [facility] records which I have reviewed extensively previously included abnormal ABIs so I went back and looked at these.  He does have some claudication with ambulation and in fact though he has normal resting ABIs he has some abnormalities consistent with moderate stenosis following exercise.  He says he does get leg pain when he walks up an incline and it takes a little while to recover but he is not having any resting pain.  He denies any new shortness of breath, PND or orthopnea.  He had no new palpitations, presyncope or syncope.  He is required no nitroglycerin since I saw him.  Past Medical History:  Diagnosis Date  . Coronary artery disease   . GERD (gastroesophageal reflux disease)   . High cholesterol    . History of kidney stones    "passed them" (01/17/2016)  . Hypertension   . PONV (postoperative nausea and vomiting)    "when I woke up from my hernia I was nauseated"  . STEMI (ST elevation myocardial infarction) (HCC) 01/12/2016    Past Surgical History:  Procedure Laterality Date  . CARDIAC CATHETERIZATION N/A 01/12/2016   Procedure: Left Heart Cath and Coronary Angiography;  Surgeon: Lennette Bihari, MD;  Location: [facility] INVASIVE CV LAB;  Service: Cardiovascular;  Laterality: N/A;  . CARDIAC CATHETERIZATION N/A 01/12/2016   Procedure: Coronary Stent Intervention;  Surgeon: Lennette Bihari, MD;  Location: [facility] INVASIVE CV LAB;  Service: Cardiovascular;  Laterality: N/A;  . CARDIAC CATHETERIZATION N/A 01/17/2016   Procedure: Coronary Stent Intervention;  Surgeon: Lennette Bihari, MD;  Location: [facility] INVASIVE CV LAB;  Service: Cardiovascular;  Laterality: N/A;  . CARDIAC CATHETERIZATION N/A 01/17/2016   Procedure: Coronary Balloon Angioplasty;  Surgeon: Lennette Bihari, MD;  Location: [facility] INVASIVE CV LAB;  Service: Cardiovascular;  Laterality: N/A;  . CHOLECYSTECTOMY N/A 11/30/2019   Procedure: LAPAROSCOPIC CHOLECYSTECTOMY;  Surgeon: Andria Meuse, MD;  Location: [facility] OR;  Service: General;  Laterality: N/A;  . COLONOSCOPY  ~ 2008  . CORONARY ANGIOPLASTY WITH STENT PLACEMENT  01/17/2016   "3 01/12/2016; 3 01/17/2016"  . GANGLION CYST EXCISION Right ~ 2016  . INGUINAL HERNIA REPAIR Right ~  2014  . kidney stones  2010     Current Outpatient Medications  Medication Sig Dispense Refill  . aspirin EC 81 MG tablet Take 81 mg by mouth daily.    Marland Kitchen atorvastatin (LIPITOR) 80 MG tablet Take 80 mg by mouth daily.    . Coenzyme Q10-Vitamin E (QUNOL ULTRA COQ10) 100-150 MG-UNIT CAPS Take by mouth.    . ezetimibe (ZETIA) 10 MG tablet Take 10 mg by mouth daily.    . fluocinonide cream (LIDEX) 0.05 % Apply 1 application topically daily as needed (topical allergic reactions/itching).     . metoprolol succinate  (TOPROL-XL) 100 MG 24 hr tablet Take 100 mg by mouth daily. Take with or immediately following a meal.    . Multiple Vitamin (MULTIVITAMIN WITH MINERALS) TABS tablet Take 1 tablet by mouth daily.    . nitroGLYCERIN (NITROSTAT) 0.4 MG SL tablet Place 1 tablet (0.4 mg total) under the tongue every 5 (five) minutes as needed for chest pain. 25 tablet 2  . Omega-3 Fatty Acids (FISH OIL TRIPLE STRENGTH) 1400 MG CAPS Take 1,400 mg by mouth every morning.    Marland Kitchen omeprazole (PRILOSEC) 20 MG capsule Take 20 mg by mouth daily.    . sacubitril-valsartan (ENTRESTO) 24-26 MG Take 1 tablet by mouth 2 (two) times daily. 180 tablet 1  . spironolactone (ALDACTONE) 25 MG tablet Take 25 mg by mouth daily.     No current facility-administered medications for this visit.    Allergies:   Penicillins     ROS:  Please see the history of present illness.   Otherwise, review of systems are positive for none.   All other systems are reviewed and negative.    PHYSICAL EXAM: VS:  BP 120/72   Pulse 87   Ht 6\' 2"  (1.88 m)   Wt 246 lb 12.8 oz (111.9 kg)   SpO2 97%   BMI 31.69 kg/m  , BMI Body mass index is 31.69 kg/m. GENERAL:  Well appearing NECK:  No jugular venous distention, waveform within normal limits, carotid upstroke brisk and symmetric, no bruits, no thyromegaly LUNGS:  Clear to auscultation bilaterally CHEST: Well-healed ICD pocket HEART:  PMI not displaced or sustained,S1 and S2 within normal limits, no S3, no S4, no clicks, no rubs, no murmurs ABD:  Flat, positive bowel sounds normal in frequency in pitch, no bruits, no rebound, no guarding, no midline pulsatile mass, no hepatomegaly, no splenomegaly EXT:  2 plus pulses throughout, no edema, no cyanosis no clubbing   EKG:  EKG is not ordered today.    Recent Labs: 12/26/2019: ALT 324; BUN 24; Creatinine, Ser 0.98; Hemoglobin 14.0; Platelets 201; Potassium 3.6; Sodium 137    Lipid Panel    Component Value Date/Time   CHOL 111 04/20/2016 1002    TRIG 107 04/20/2016 1002   HDL 34 (L) 04/20/2016 1002   CHOLHDL 3.3 04/20/2016 1002   CHOLHDL 3.9 03/17/2016 0133   VLDL 22 03/17/2016 0133   LDLCALC 56 04/20/2016 1002      Wt Readings from Last 3 Encounters:  05/27/20 246 lb 12.8 oz (111.9 kg)  03/01/20 245 lb (111.1 kg)  01/13/20 241 lb (109.3 kg)      Other studies Reviewed: Additional studies/ records that were reviewed today include: [facility] records Review of the above records demonstrates:  Please see elsewhere in the note.     ASSESSMENT AND PLAN:   CAD:    The patient has no new sypmtoms.  No further cardiovascular testing  is indicated.  We will continue with aggressive risk reduction and meds as listed.  ISCHEMIC CARDIOMYOPATHY:   I am going to stop his lisinopril and start Entresto 24/26 twice daily.  He has been told about how long to hold the ACE inhibitor.  He is getting his blood work done about 6 days at the [place].  We talked about renal function as he does have a mildly elevated creatinine 1.4 in March.  We will follow this closely.  PAF:   Has had no symptomatic recurrence.  He is up-to-date with device follow-up.  DYSLIPIDEMIA: His last LDL was 45 with an HDL of 36.  No change in therapy.  ICD: He is up-to-date with follow-up.    PVD: His symptoms are mild and we are going to manage this with him and continue exercise.  Otherwise no change in therapy.  Current medicines are reviewed at length with the patient today.  The patient does not have concerns regarding medicines.  The following changes have been made: As above  Labs/ tests ordered today include:   No orders of the defined types were placed in this encounter.    Disposition:   FU with me in 3 months with an APP.     Signed, Rollene Rotunda, MD  05/27/2020 10:42 AM    [facility]

## 2020-05-27 ENCOUNTER — Ambulatory Visit: Payer: Managed Care, Other (non HMO) | Admitting: Cardiology

## 2020-05-27 ENCOUNTER — Encounter: Payer: Self-pay | Admitting: Cardiology

## 2020-05-27 ENCOUNTER — Other Ambulatory Visit: Payer: Self-pay

## 2020-05-27 ENCOUNTER — Ambulatory Visit (INDEPENDENT_AMBULATORY_CARE_PROVIDER_SITE_OTHER): Payer: Managed Care, Other (non HMO)

## 2020-05-27 VITALS — BP 120/72 | HR 87 | Ht 74.0 in | Wt 246.8 lb

## 2020-05-27 DIAGNOSIS — I255 Ischemic cardiomyopathy: Secondary | ICD-10-CM | POA: Diagnosis not present

## 2020-05-27 DIAGNOSIS — I480 Paroxysmal atrial fibrillation: Secondary | ICD-10-CM

## 2020-05-27 DIAGNOSIS — Z4502 Encounter for adjustment and management of automatic implantable cardiac defibrillator: Secondary | ICD-10-CM | POA: Diagnosis not present

## 2020-05-27 DIAGNOSIS — E785 Hyperlipidemia, unspecified: Secondary | ICD-10-CM | POA: Diagnosis not present

## 2020-05-27 DIAGNOSIS — I25110 Atherosclerotic heart disease of native coronary artery with unstable angina pectoris: Secondary | ICD-10-CM | POA: Diagnosis not present

## 2020-05-27 MED ORDER — ENTRESTO 24-26 MG PO TABS: 1.0000 | ORAL_TABLET | Freq: Two times a day (BID) | ORAL | 1 refills | Status: AC

## 2020-05-27 NOTE — Patient Instructions (Signed)
Medication Instructions:  STOP LISINOPRIL   START ENTRESTO 24-26 TWICE A DAY Sunday NIGHT   *If you need a refill on your cardiac medications before your next appointment, please call your pharmacy*  Lab Work: AS ALREADY ORDERED AT [facility]  If you have labs (blood work) drawn today and your tests are completely normal, you will receive your results only by: Marland Kitchen MyChart Message (if you have MyChart) OR . A paper copy in the mail If you have any lab test that is abnormal or we need to change your treatment, we will call you to review the results.   Testing/Procedures: NONE   Follow-Up: At [facility], you and your health needs are our priority.  As part of our continuing mission to provide you with exceptional heart care, we have created designated Provider Care Teams.  These Care Teams include your primary Cardiologist (physician) and Advanced Practice Providers (APPs -  Physician Assistants and Nurse Practitioners) who all work together to provide you with the care you need, when you need it.  We recommend signing up for the patient portal called "MyChart".  Sign up information is provided on this After Visit Summary.  MyChart is used to connect with patients for Virtual Visits (Telemedicine).  Patients are able to view lab/test results, encounter notes, upcoming appointments, etc.  Non-urgent messages can be sent to your provider as well.   To learn more about what you can do with MyChart, go to [email or web address].    Your next appointment:   3 month(s)  The format for your next appointment:   In Person  Provider:   PA/NP

## 2020-05-30 LAB — CUP PACEART REMOTE DEVICE CHECK
Battery Remaining Longevity: 84 mo
Battery Voltage: 2.99 V
Brady Statistic AP VP Percent: 0.06 %
Brady Statistic AP VS Percent: 64.02 %
Brady Statistic AS VP Percent: 0.01 %
Brady Statistic AS VS Percent: 35.9 %
Brady Statistic RA Percent Paced: 63.6 %
Brady Statistic RV Percent Paced: 0.07 %
Date Time Interrogation Session: 20220520033524
HighPow Impedance: 66 Ohm
Implantable Lead Implant Date: 20190530
Implantable Lead Implant Date: 20190530
Implantable Lead Location: 753859
Implantable Lead Location: 753860
Implantable Lead Model: 5076
Implantable Pulse Generator Implant Date: 20190530
Lead Channel Impedance Value: 342 Ohm
Lead Channel Impedance Value: 399 Ohm
Lead Channel Impedance Value: 437 Ohm
Lead Channel Pacing Threshold Amplitude: 0.5 V
Lead Channel Pacing Threshold Amplitude: 0.75 V
Lead Channel Pacing Threshold Pulse Width: 0.4 ms
Lead Channel Pacing Threshold Pulse Width: 0.4 ms
Lead Channel Sensing Intrinsic Amplitude: 13.75 mV
Lead Channel Sensing Intrinsic Amplitude: 13.75 mV
Lead Channel Sensing Intrinsic Amplitude: 3 mV
Lead Channel Sensing Intrinsic Amplitude: 3 mV
Lead Channel Setting Pacing Amplitude: 1.5 V
Lead Channel Setting Pacing Amplitude: 2 V
Lead Channel Setting Pacing Pulse Width: 0.4 ms
Lead Channel Setting Sensing Sensitivity: 0.3 mV

## 2020-06-13 NOTE — Progress Notes (Addendum)
Remote ICD transmission.   

## 2020-06-30 ENCOUNTER — Telehealth: Payer: Self-pay | Admitting: Cardiology

## 2020-06-30 ENCOUNTER — Other Ambulatory Visit: Payer: Self-pay

## 2020-06-30 ENCOUNTER — Encounter (HOSPITAL_COMMUNITY): Payer: Self-pay | Admitting: Pharmacy Technician

## 2020-06-30 ENCOUNTER — Emergency Department (HOSPITAL_COMMUNITY)
Admission: EM | Admit: 2020-06-30 | Discharge: 2020-06-30 | Disposition: A | Discharge status: Home / Self Care | Payer: Managed Care, Other (non HMO) | Attending: Emergency Medicine | Admitting: Emergency Medicine

## 2020-06-30 DIAGNOSIS — I480 Paroxysmal atrial fibrillation: Secondary | ICD-10-CM | POA: Insufficient documentation

## 2020-06-30 DIAGNOSIS — Z7982 Long term (current) use of aspirin: Secondary | ICD-10-CM | POA: Insufficient documentation

## 2020-06-30 DIAGNOSIS — Z951 Presence of aortocoronary bypass graft: Secondary | ICD-10-CM | POA: Diagnosis not present

## 2020-06-30 DIAGNOSIS — Z87442 Personal history of urinary calculi: Secondary | ICD-10-CM | POA: Diagnosis not present

## 2020-06-30 DIAGNOSIS — R002 Palpitations: Secondary | ICD-10-CM | POA: Diagnosis present

## 2020-06-30 DIAGNOSIS — Z7901 Long term (current) use of anticoagulants: Secondary | ICD-10-CM | POA: Diagnosis not present

## 2020-06-30 DIAGNOSIS — I25110 Atherosclerotic heart disease of native coronary artery with unstable angina pectoris: Secondary | ICD-10-CM | POA: Diagnosis not present

## 2020-06-30 DIAGNOSIS — Z955 Presence of coronary angioplasty implant and graft: Secondary | ICD-10-CM | POA: Insufficient documentation

## 2020-06-30 DIAGNOSIS — Z87891 Personal history of nicotine dependence: Secondary | ICD-10-CM | POA: Insufficient documentation

## 2020-06-30 DIAGNOSIS — Z79899 Other long term (current) drug therapy: Secondary | ICD-10-CM | POA: Insufficient documentation

## 2020-06-30 DIAGNOSIS — I10 Essential (primary) hypertension: Secondary | ICD-10-CM | POA: Insufficient documentation

## 2020-06-30 LAB — CBC WITH DIFFERENTIAL/PLATELET
Abs Immature Granulocytes: 0.06 10*3/uL (ref 0.00–0.07)
Basophils Absolute: 0.1 10*3/uL (ref 0.0–0.1)
Basophils Relative: 1 %
Eosinophils Absolute: 0.4 10*3/uL (ref 0.0–0.5)
Eosinophils Relative: 3 %
HCT: 48.5 % (ref 39.0–52.0)
Hemoglobin: 16.5 g/dL (ref 13.0–17.0)
Immature Granulocytes: 1 %
Lymphocytes Relative: 23 %
Lymphs Abs: 2.5 10*3/uL (ref 0.7–4.0)
MCH: 31.6 pg (ref 26.0–34.0)
MCHC: 34 g/dL (ref 30.0–36.0)
MCV: 92.9 fL (ref 80.0–100.0)
Monocytes Absolute: 1 10*3/uL (ref 0.1–1.0)
Monocytes Relative: 9 %
Neutro Abs: 6.6 10*3/uL (ref 1.7–7.7)
Neutrophils Relative %: 63 %
Platelets: 214 10*3/uL (ref 150–400)
RBC: 5.22 MIL/uL (ref 4.22–5.81)
RDW: 12.6 % (ref 11.5–15.5)
WBC: 10.6 10*3/uL — ABNORMAL HIGH (ref 4.0–10.5)
nRBC: 0 % (ref 0.0–0.2)

## 2020-06-30 LAB — COMPREHENSIVE METABOLIC PANEL
ALT: 28 U/L (ref 0–44)
AST: 26 U/L (ref 15–41)
Albumin: 4.1 g/dL (ref 3.5–5.0)
Alkaline Phosphatase: 92 U/L (ref 38–126)
Anion gap: 8 (ref 5–15)
BUN: 25 mg/dL — ABNORMAL HIGH (ref 8–23)
CO2: 24 mmol/L (ref 22–32)
Calcium: 9.4 mg/dL (ref 8.9–10.3)
Chloride: 106 mmol/L (ref 98–111)
Creatinine, Ser: 1.38 mg/dL — ABNORMAL HIGH (ref 0.61–1.24)
GFR, Estimated: 57 mL/min — ABNORMAL LOW (ref >60)
Glucose, Bld: 104 mg/dL — ABNORMAL HIGH (ref 70–99)
Potassium: 3.8 mmol/L (ref 3.5–5.1)
Sodium: 138 mmol/L (ref 135–145)
Total Bilirubin: 1.3 mg/dL — ABNORMAL HIGH (ref 0.3–1.2)
Total Protein: 7.1 g/dL (ref 6.5–8.1)

## 2020-06-30 LAB — TROPONIN I (HIGH SENSITIVITY)
Troponin I (High Sensitivity): 27 ng/L — ABNORMAL HIGH (ref <18)
Troponin I (High Sensitivity): 79 ng/L — ABNORMAL HIGH (ref <18)

## 2020-06-30 MED ORDER — APIXABAN 5 MG PO TABS: 5.0000 mg | ORAL_TABLET | Freq: Two times a day (BID) | ORAL | 0 refills | Status: DC

## 2020-06-30 NOTE — Telephone Encounter (Signed)
STAT if HR is under 50 or over 120 (normal HR is 60-100 beats per minute)  What is your heart rate? 140  Do you have a log of your heart rate readings (document readings)? 140,144,145, 140  Do you have any other symptoms? Sweating even though the room temp is only 72.  The patient has an apple watch and it says he is not in afib.   Patient c/o Palpitations:  High priority if patient c/o lightheadedness, shortness of breath, or chest pain  How long have you had palpitations/irregular HR/ Afib? Are you having the symptoms now? Yes   Are you currently experiencing lightheadedness, SOB or CP? No. Its just a weird feeling   Do you have a history of afib (atrial fibrillation) or irregular heart rhythm? yes  Have you checked your BP or HR? (document readings if available): has not checked BP. Just checked HR . See above   Are you experiencing any other symptoms? Just sweating

## 2020-06-30 NOTE — ED Notes (Signed)
Per EDP, interrogated pt's pacemaker, awaiting report from [employer]

## 2020-06-30 NOTE — ED Triage Notes (Signed)
Pt here with reports of feeling like his heart was racing earlier today. Pt states he checked his HR on his watch and it showed him in afib with HR 145. Pt with medtronic AICD.

## 2020-06-30 NOTE — Discharge Instructions (Addendum)
When we reviewed the interrogation from your pacemaker, it showed that you had approximately a 2-hour episode of atrial fibrillation today.  Atrial fibrillation can place you at higher risk of stroke.  For this reason, many people are started on a blood thinner that helps to prevent stroke.  I spoke with the cardiologist on-call at this facility who recommended starting you on Eliquis which is a blood thinner.  They will send a message to your cardiologist who can schedule you a follow-up appointment.  Please continue taking all other medications as prescribed.

## 2020-06-30 NOTE — ED Provider Notes (Signed)
Emergency Medicine Provider Triage Evaluation Note  Shawn Elliott , a 65 y.o. male  was evaluated in triage.  Pt complains of A. fib.  He states that earlier he was in A. fib for about an hour with a heart rate of 145.  He states he has been compliant with all of his meds.  He sent a transmission to his cardiologist earlier who reportedly recommended that he come into the ER.  He states he did take his meds slightly later than normal today.  Currently he denies any chest pain or shortness of breath.  He was not having chest pain earlier.  He declines chest x-ray.  He states currently he feels fine and back to normal..  Review of Systems  Positive: Palpitations Negative: Chest pain, shortness of breath  Physical Exam  BP 138/80 (BP Location: Left Arm)   Pulse (!) 58   Temp 98.4 F (36.9 C) (Oral)   Resp 14   SpO2 99%  Gen:   Awake, no distress   Resp:  Normal effort  MSK:   Moves extremities without difficulty  Other:  Patient is awake and alert, speech is nonslurred.  He answers questions appropriately without difficulty.  Medical Decision Making  Medically screening exam initiated at 6:50 PM.  Appropriate orders placed.  Raul Del was informed that the remainder of the evaluation will be completed by another provider, this initial triage assessment does not replace that evaluation, and the importance of remaining in the ED until their evaluation is complete.  Patient is here for evaluation of reported A. fib for about an hour earlier.  He currently feels fine.  He denies any chest pain or shortness of breath,   Norman Clay 06/30/20 1851    Terald Sleeper, MD 07/01/20 [phone]

## 2020-06-30 NOTE — ED Provider Notes (Signed)
[facility] EMERGENCY DEPARTMENT Provider Note   CSN: [id number] Arrival date & time: 06/30/20  1743     History Chief Complaint  Patient presents with   Atrial Fibrillation    Shawn Elliott is a 65 y.o. male.   Atrial Fibrillation This is a recurrent problem. The current episode started 3 to 5 hours ago. The problem occurs constantly. The problem has been resolved. Pertinent negatives include no chest pain, no abdominal pain, no headaches and no shortness of breath. Nothing aggravates the symptoms. Nothing relieves the symptoms.  Palpitations Palpitations quality:  Fast Onset quality:  Sudden Duration:  1 hour Timing:  Constant Progression:  Resolved Chronicity:  New Context: dehydration (working outside in heat today)   Context: not anxiety, not appetite suppressants, not blood loss, not bronchodilators, not caffeine, not exercise, not hyperventilation, not illicit drugs, not nicotine and not stimulant use   Relieved by:  None tried Worsened by:  Nothing Ineffective treatments:  None tried Associated symptoms: no back pain, no chest pain, no chest pressure, no cough, no diaphoresis, no dizziness, no hemoptysis, no leg pain, no lower extremity edema, no malaise/fatigue, no nausea, no near-syncope, no numbness, no orthopnea, no PND, no shortness of breath, no syncope, no vomiting and no weakness   Risk factors: heart disease and hx of atrial fibrillation   Risk factors: no diabetes mellitus, no hx of DVT, no hx of PE, no hx of thyroid disease, no hypercoagulable state, no hyperthyroidism, no OTC sinus medications and no stress    Patient had a 1 hour episode of palpitations started approximately 1 hour prior to arrival resolved shortly before arrival to the emergency department. Says that he checked his apple watch and he said that his heart rate was around 145.  Has not missed any doses of metoprolol. He has an ICD in place but denies being shocked.  No  recent infectious symptoms, no recent trauma.  Only changes that he was working outside in the heat today helping a family member move and has not been drinking any water and does feel dehydrated.     Past Medical History:  Diagnosis Date   Coronary artery disease    GERD (gastroesophageal reflux disease)    High cholesterol    History of kidney stones    "passed them" (01/17/2016)   Hypertension    PONV (postoperative nausea and vomiting)    "when I woke up from my hernia I was nauseated"   STEMI (ST elevation myocardial infarction) (HCC) 01/12/2016    Patient Active Problem List   Diagnosis Date Noted   ICD (implantable cardioverter-defibrillator) battery depletion 05/26/2020   Hepatic steatosis 12/01/2019   Bacteremia due to Klebsiella pneumoniae 12/01/2019   Severe sepsis (HCC) 12/01/2019   Cholelithiasis 11/29/2019   Elevated liver enzymes 11/29/2019   Demand ischemia (HCC)    Chest pain 05/29/2017   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   Atrial fibrillation (HCC) 03/16/2016   Status post coronary artery stent placement    Obesity (BMI 30-39.9)    Hyperlipidemia LDL goal <70    Elevated liver function tests    Acute ST elevation myocardial infarction (STEMI) involving right coronary artery (HCC) 01/12/2016   Coronary artery disease involving native coronary artery of native heart with unstable angina pectoris (HCC) 01/12/2016   Atrial fibrillation with rapid ventricular response (HCC) 01/12/2016   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   Acute ST elevation myocardial infarction (STEMI) due to occlusion of right coronary artery (HCC)  Coronary artery disease involving native coronary artery with unstable angina pectoris [facility])     Past Surgical History:  Procedure Laterality Date   CARDIAC CATHETERIZATION N/A 01/12/2016   Procedure: Left Heart Cath and Coronary Angiography;  Surgeon: Lennette Bihari, MD;  Location: [facility] INVASIVE CV LAB;  Service: Cardiovascular;  Laterality: N/A;    CARDIAC CATHETERIZATION N/A 01/12/2016   Procedure: Coronary Stent Intervention;  Surgeon: Lennette Bihari, MD;  Location: [facility] INVASIVE CV LAB;  Service: Cardiovascular;  Laterality: N/A;   CARDIAC CATHETERIZATION N/A 01/17/2016   Procedure: Coronary Stent Intervention;  Surgeon: Lennette Bihari, MD;  Location: [facility] INVASIVE CV LAB;  Service: Cardiovascular;  Laterality: N/A;   CARDIAC CATHETERIZATION N/A 01/17/2016   Procedure: Coronary Balloon Angioplasty;  Surgeon: Lennette Bihari, MD;  Location: [facility] INVASIVE CV LAB;  Service: Cardiovascular;  Laterality: N/A;   CHOLECYSTECTOMY N/A 11/30/2019   Procedure: LAPAROSCOPIC CHOLECYSTECTOMY;  Surgeon: Andria Meuse, MD;  Location: [facility] OR;  Service: General;  Laterality: N/A;   COLONOSCOPY  ~ 2008   CORONARY ANGIOPLASTY WITH STENT PLACEMENT  01/17/2016   "3 01/12/2016; 3 01/17/2016"   GANGLION CYST EXCISION Right ~ 2016   INGUINAL HERNIA REPAIR Right ~ 2014   kidney stones  2010       Family History  Problem Relation Age of Onset   Heart attack Mother    Diabetes Mother    Stroke Mother    Hypertension Mother    Hyperlipidemia Mother    Diabetes Father    Stroke Father    Hyperlipidemia Father    Hypertension Father    Hyperlipidemia Sister    Hypertension Sister    Hypertension Brother    Hyperlipidemia Brother    Diabetes Brother     Social History   Tobacco Use   Smoking status: Former    Pack years: 0.00    Types: Pipe    Quit date: 1982    Years since quitting: 40.5   Smokeless tobacco: Never  Vaping Use   Vaping Use: Never used  Substance Use Topics   Alcohol use: Yes    Alcohol/week: 1.0 standard drink    Types: 1 Cans of beer per week    Comment: occassionally   Drug use: No    Home Medications Prior to Admission medications   Medication Sig Start Date End Date Taking? Authorizing Provider  apixaban (ELIQUIS) 5 MG TABS tablet Take 1 tablet (5 mg total) by mouth 2 (two) times daily. 06/30/20 07/30/20 Yes Ardeen Fillers, DO  aspirin EC 81 MG tablet Take 81 mg by mouth daily.   Yes [provider]  atorvastatin (LIPITOR) 80 MG tablet Take 80 mg by mouth daily.   Yes [provider]  cholecalciferol (VITAMIN D3) 25 MCG (1000 UNIT) tablet Take 1,000 Units by mouth daily.   Yes [provider]  Coenzyme Q10-Vitamin E (QUNOL ULTRA COQ10) 100-150 MG-UNIT CAPS Take 1 tablet by mouth at bedtime.   Yes [provider]  ezetimibe (ZETIA) 10 MG tablet Take 10 mg by mouth daily.   Yes [provider]  metoprolol succinate (TOPROL-XL) 100 MG 24 hr tablet Take 100 mg by mouth daily. Take with or immediately following a meal.   Yes [provider]  Multiple Vitamin (MULTIVITAMIN WITH MINERALS) TABS tablet Take 1 tablet by mouth daily.   Yes [provider]  nitroGLYCERIN (NITROSTAT) 0.4 MG SL tablet Place 1 tablet (0.4 mg total) under the tongue every 5 (five)  minutes as needed for chest pain. 02/02/16  Yes Almond Lint, MD  Omega-3 Fatty Acids (FISH OIL TRIPLE STRENGTH) 1400 MG CAPS Take 1,400 mg by mouth in the morning and at bedtime.   Yes [provider]  omeprazole (PRILOSEC) 20 MG capsule Take 20 mg by mouth daily. 12/04/15  Yes [provider]  sacubitril-valsartan (ENTRESTO) 24-26 MG Take 1 tablet by mouth 2 (two) times daily. 05/27/20  Yes Rollene Rotunda, MD  spironolactone (ALDACTONE) 25 MG tablet Take 25 mg by mouth daily.   Yes [provider]  fluocinonide cream (LIDEX) 0.05 % Apply 1 application topically daily as needed (topical allergic reactions/itching).  Patient not taking: Reported on 06/30/2020 09/08/19   [provider]    Allergies    Penicillins  Review of Systems   Review of Systems  Constitutional:  Negative for chills, diaphoresis, fever and malaise/fatigue.  HENT:  Negative for ear pain and sore throat.   Eyes:  Negative for pain and visual disturbance.  Respiratory:  Negative for cough,  hemoptysis and shortness of breath.   Cardiovascular:  Positive for palpitations. Negative for chest pain, orthopnea, syncope, PND and near-syncope.  Gastrointestinal:  Negative for abdominal pain, nausea and vomiting.  Genitourinary:  Negative for dysuria and hematuria.  Musculoskeletal:  Negative for arthralgias and back pain.  Skin:  Negative for color change and rash.  Neurological:  Negative for dizziness, seizures, syncope, weakness, numbness and headaches.  All other systems reviewed and are negative.  Physical Exam Updated Vital Signs BP 124/79   Pulse 67   Temp 98.4 F (36.9 C) (Oral)   Resp 16   SpO2 99%   Physical Exam Vitals and nursing note reviewed.  Constitutional:      Appearance: He is well-developed. He is not ill-appearing or toxic-appearing.  HENT:     Head: Normocephalic and atraumatic.  Eyes:     Conjunctiva/sclera: Conjunctivae normal.  Cardiovascular:     Rate and Rhythm: Normal rate and regular rhythm.     Pulses:          Radial pulses are 2+ on the right side and 2+ on the left side.       Dorsalis pedis pulses are 2+ on the right side and 2+ on the left side.     Heart sounds: Normal heart sounds. No murmur heard. Pulmonary:     Effort: Pulmonary effort is normal. No respiratory distress.     Breath sounds: Normal breath sounds.  Abdominal:     Palpations: Abdomen is soft.     Tenderness: There is no abdominal tenderness.  Musculoskeletal:     Cervical back: Neck supple.     Right lower leg: No edema.     Left lower leg: No edema.  Skin:    General: Skin is warm and dry.  Neurological:     General: No focal deficit present.     Mental Status: He is alert and oriented to person, place, and time.     GCS: GCS eye subscore is 4. GCS verbal subscore is 5. GCS motor subscore is 6.    ED Results / Procedures / Treatments   Labs (all labs ordered are listed, but only abnormal results are displayed) Labs Reviewed  CBC WITH  DIFFERENTIAL/PLATELET - Abnormal; Notable for the following components:      Result Value   WBC 10.6 (*)    All other components within normal limits  COMPREHENSIVE METABOLIC PANEL - Abnormal; Notable for the following  components:   Glucose, Bld 104 (*)    BUN 25 (*)    Creatinine, Ser 1.38 (*)    Total Bilirubin 1.3 (*)    GFR, Estimated 57 (*)    All other components within normal limits  TROPONIN I (HIGH SENSITIVITY) - Abnormal; Notable for the following components:   Troponin I (High Sensitivity) 27 (*)    All other components within normal limits  TROPONIN I (HIGH SENSITIVITY) - Abnormal; Notable for the following components:   Troponin I (High Sensitivity) 79 (*)    All other components within normal limits    EKG EKG Interpretation  Date/Time:  Thursday June 30 2020 20:46:13 EDT Ventricular Rate:  68 PR Interval:  167 QRS Duration: 126 QT Interval:  404 QTC Calculation: 430 R Axis:   70 Text Interpretation: Sinus rhythm Ventricular premature complex Nonspecific intraventricular conduction delay Anterolateral infarct, age indeterminate Confirmed by Virgina Norfolk [phone]) on 06/30/2020 9:13:29 PM  Radiology No results found.  Procedures Procedures   Medications Ordered in ED Medications - No data to display  ED Course  I have reviewed the triage vital signs and the nursing notes.  Pertinent labs & imaging results that were available during my care of the patient were reviewed by me and considered in my medical decision making (see chart for details).  Clinical Course as of 07/01/20 0001  Thu Jun 30, 2020  2230 [employer] rep called: patient had a 2 hour run of atrial arrhythmias today. Has had 5 separate additional episodes since January this year.  Page sent to cards for recs on anticoagulation [ZB]  2246 Spoke with cardiology on call. Recommended starting him on Eliquis and he will send a message to the patient's cardiologist for follow up. I sent an RX for  eliquis to his pharmacy.  [ZB]  2359 Delta troponin increased to 79 from 27. He continues to be asymptomatic. Spoke with cardiology again about this finding. Patient does not want to stay for delta troponin per cardiology recommendations. He understands the risks of leaving and not staying for recommended workup. Prefers to go home and he will come back if he is feeling bad or has recurrent symptoms.  [ZB]    Clinical Course User Index [ZB] Ardeen Fillers, DO   MDM Rules/Calculators/A&P                          Patient is a 65 year old male with complicated cardiac history including ischemic cardiomyopathy with ICD placement, history of previous atrial fibrillation not on anticoagulation who presented to the emergency department after approximately 1 hour of palpitations and his apple watch seen his heart rate was around 145.  Arrival to the emergency department he was asymptomatic.  Says that the episode resolved shortly prior to arrival.  He was not tachycardic.  He was normotensive.  Differentials include but not limited to arrhythmia including atrial fibrillation with RVR, other atrial arrhythmia.  Doubt ventricular arrhythmia without ICD firing.  Exam unremarkable. Episode could have been preceded by relative dehydration in the setting of work outside without adequate p.o. intake.  He does have slight elevation in BUN and creatinine from previous though not consistent with AKI. We talked about soft IV crystalloid for hydration however he would prefer p.o. hydration. I did consider ACS.  EKG as above without evidence of new ischemia or arrhythmia other than PVCs. Initial troponin slightly elevated at 27.  Ordered for delta assay.  Plan will be to  interrogate ICD/pacer to determine event that happened today, follow-up delta Trop and likely touch base with cardiology to determine plan moving forward.  See clinical course above for further medical decision-making.  Final Clinical  Impression(s) / ED Diagnoses Final diagnoses:  Paroxysmal atrial fibrillation (HCC)    Rx / DC Orders ED Discharge Orders          Ordered    apixaban (ELIQUIS) 5 MG TABS tablet  2 times daily        06/30/20 2257             Ardeen FillersBuchanan, Davielle Lingelbach, DO 07/01/20 0001    Virgina Norfolkuratolo, Adam, DO 07/01/20 [id number]

## 2020-06-30 NOTE — Telephone Encounter (Signed)
Received call transferred from the operator.  Pt stating he is having increased heart rate for appr 20- 25 min with rates in the 140-145 range.  He reports he has a [employer] device and has sent in a transmission.  He is sweating and feels like he has been running the stairs though he hasn't.  Had device nurse Daniel Nones) review transmission which demonstrated At Fib with RVR HR 142 bpm.  Pt advised to report to the closest ED for eval and treatment.  He was advised not to drive himself.  He states understanding.  Will forward this information to Dr Lalla Brothers and his nurse for their knowledge and any needed f/u.

## 2020-07-01 ENCOUNTER — Telehealth: Payer: Self-pay | Admitting: Cardiology

## 2020-07-01 MED ORDER — APIXABAN 5 MG PO TABS: 5.0000 mg | ORAL_TABLET | Freq: Two times a day (BID) | ORAL | 11 refills | Status: AC

## 2020-07-01 NOTE — Telephone Encounter (Signed)
Returned call to patient who states that he had an episode of Afib RVR, patient states that he feels this was instigated by a large amount of caffeine consumption yesterday. Patient states that he did go to the ER and was already out of the the Afib RVR, and states that he was prescribed Eliquis BID at the ER. Patient states that he is really hesitant to start this medication as he feel that this episode was directly related to drinking caffeine. Patient states that he would like to see what Dr. Antoine Poche and Dr. Lalla Brothers think before starting this medication.   Patient currently reports that he feels well and his heart rate is in the 70's and is regular.   Advised patient that I would forward message to Dr. Antoine Poche as well as Dr. Lalla Brothers for them to review. Patient verbalized understanding.

## 2020-07-01 NOTE — Telephone Encounter (Signed)
Pt was released from [facility] ED, pt was prescribed a blood thinner Eliquis 2X per day and advised pt to stop taking the aspirin. Pt wants to know if this is okay with Dr. Antoine Poche? Please advise

## 2020-07-01 NOTE — Telephone Encounter (Signed)
Returned call to Pt.  Advised that Dr. Lovena Neighbours recommendation is to start Eliquis 5 mg by mouth twice a day.  Per recent remote check Pt AT/AF burden is 0.6%.  Pt indicates understanding.  He will stop aspirin and stop Eliquis tonight.  Understands to take it twice a day.

## 2020-07-01 NOTE — ED Notes (Signed)
Discharge instructions reviewed and explained, pt verbalized understanding.  ?

## 2020-07-04 ENCOUNTER — Telehealth (HOSPITAL_COMMUNITY): Payer: Self-pay

## 2020-07-04 NOTE — Telephone Encounter (Signed)
Reached out to pt to schedule ED f/u. Pt decline stated that he reached out to [facility] and will see his cardiologist.

## 2020-08-17 ENCOUNTER — Telehealth: Payer: Self-pay

## 2020-08-17 NOTE — Telephone Encounter (Signed)
[facility] [place]  I cancelled all the remote appointments. I also marked him inactive in [facility].

## 2020-09-01 ENCOUNTER — Ambulatory Visit: Payer: Managed Care, Other (non HMO) | Admitting: General Practice

## 2020-12-05 IMAGING — DX DG CHEST 1V PORT
1 series · 1 of 1 positions shown · non-contrast
Comparison: 11/28/2019

CLINICAL DATA: Chest pain

EXAM:
PORTABLE CHEST 1 VIEW

[chest ap]
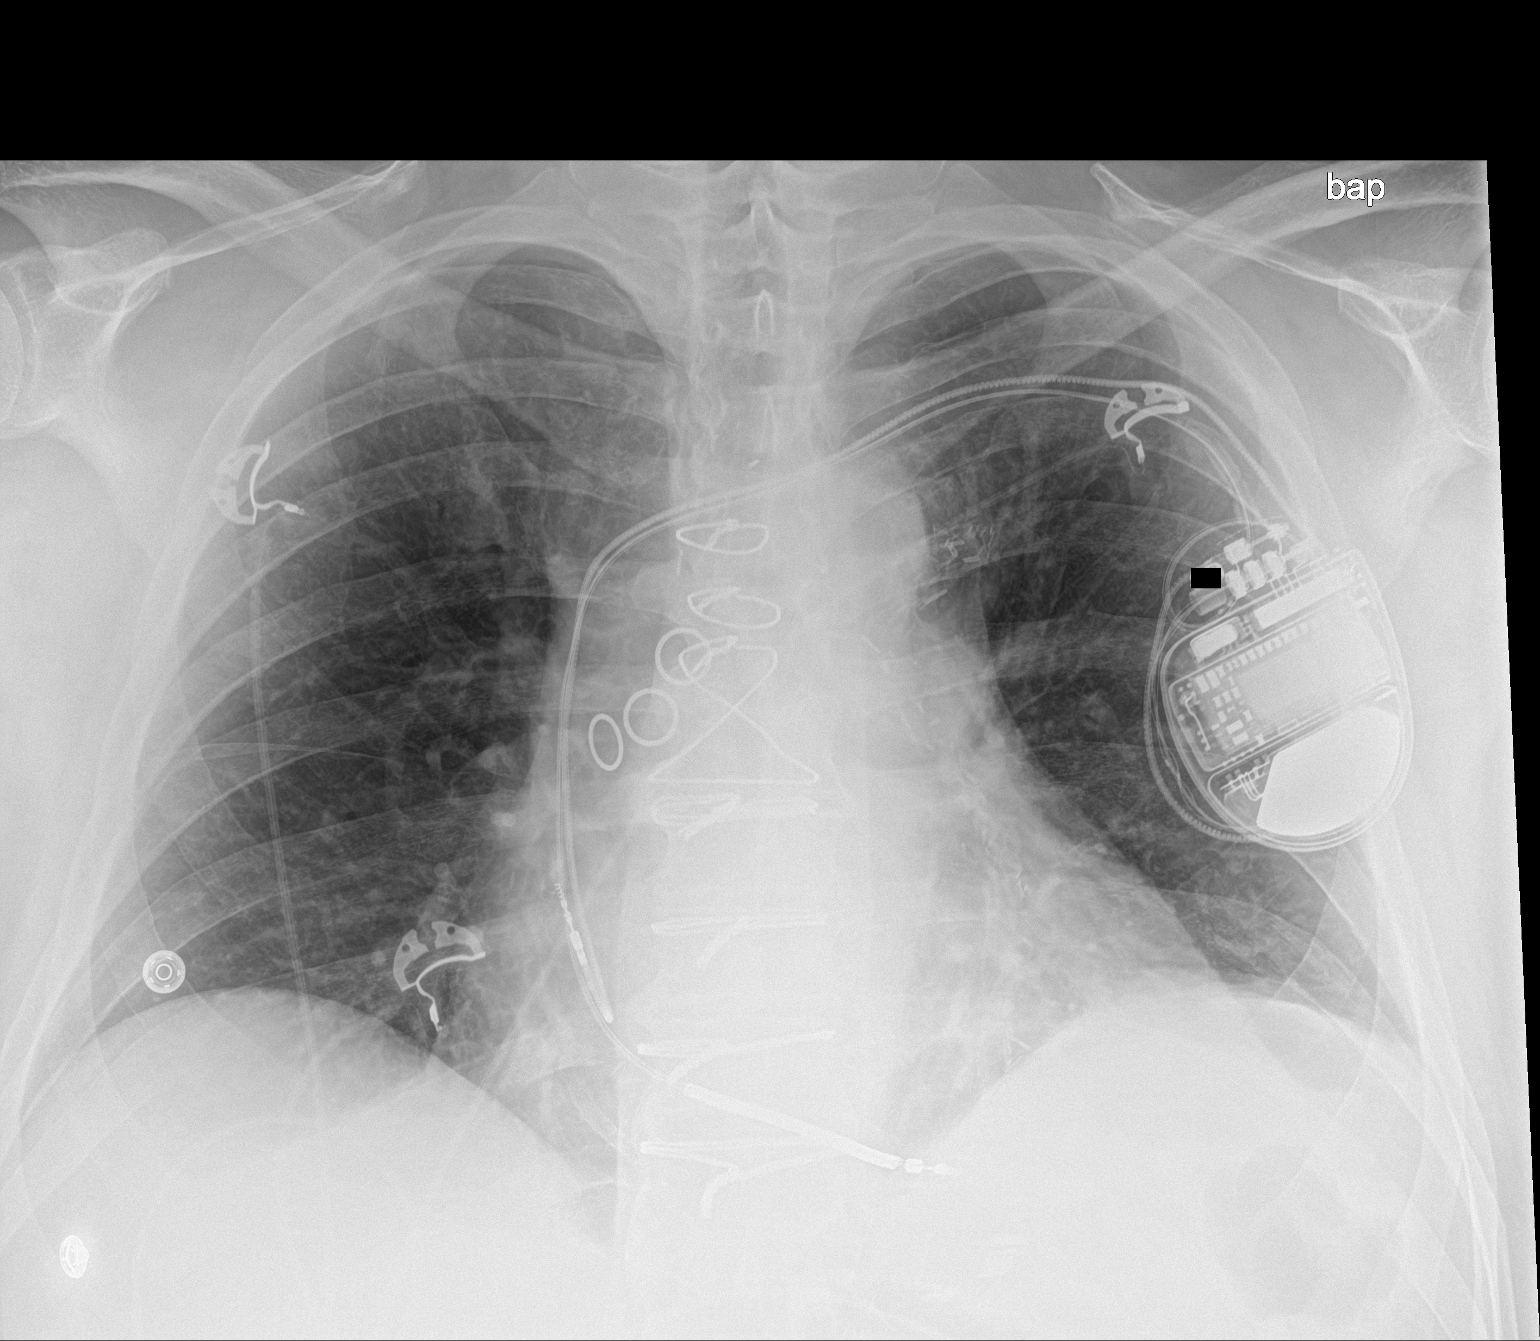

[1 of 1 positions shown; findings below may reference images not displayed]

FINDINGS: Post sternotomy changes. Left-sided pacing device as before.
Atelectasis or scar at the left base. No pleural effusion. Stable
cardiomediastinal silhouette. No pneumothorax.
IMPRESSION: Atelectasis or scar at the left base.

## 2021-10-14 ENCOUNTER — Other Ambulatory Visit: Payer: Self-pay

## 2021-10-14 ENCOUNTER — Emergency Department (HOSPITAL_COMMUNITY)
Admission: EM | Admit: 2021-10-14 | Discharge: 2021-10-15 | Disposition: A | Discharge status: Home / Self Care | Payer: No Typology Code available for payment source | Attending: Emergency Medicine | Admitting: Emergency Medicine

## 2021-10-14 ENCOUNTER — Encounter (HOSPITAL_COMMUNITY): Payer: Self-pay | Admitting: Emergency Medicine

## 2021-10-14 DIAGNOSIS — Z951 Presence of aortocoronary bypass graft: Secondary | ICD-10-CM | POA: Insufficient documentation

## 2021-10-14 DIAGNOSIS — I5022 Chronic systolic (congestive) heart failure: Secondary | ICD-10-CM | POA: Insufficient documentation

## 2021-10-14 DIAGNOSIS — I110 Hypertensive heart disease with heart failure: Secondary | ICD-10-CM | POA: Diagnosis not present

## 2021-10-14 DIAGNOSIS — Z955 Presence of coronary angioplasty implant and graft: Secondary | ICD-10-CM | POA: Insufficient documentation

## 2021-10-14 DIAGNOSIS — M5442 Lumbago with sciatica, left side: Secondary | ICD-10-CM | POA: Insufficient documentation

## 2021-10-14 DIAGNOSIS — D72829 Elevated white blood cell count, unspecified: Secondary | ICD-10-CM | POA: Insufficient documentation

## 2021-10-14 DIAGNOSIS — M549 Dorsalgia, unspecified: Secondary | ICD-10-CM | POA: Diagnosis present

## 2021-10-14 DIAGNOSIS — M542 Cervicalgia: Secondary | ICD-10-CM | POA: Insufficient documentation

## 2021-10-14 DIAGNOSIS — Z79899 Other long term (current) drug therapy: Secondary | ICD-10-CM | POA: Insufficient documentation

## 2021-10-14 DIAGNOSIS — Z7901 Long term (current) use of anticoagulants: Secondary | ICD-10-CM | POA: Insufficient documentation

## 2021-10-14 LAB — CBC WITH DIFFERENTIAL/PLATELET
Abs Immature Granulocytes: 0.09 10*3/uL — ABNORMAL HIGH (ref 0.00–0.07)
Basophils Absolute: 0.1 10*3/uL (ref 0.0–0.1)
Basophils Relative: 0 %
Eosinophils Absolute: 0.1 10*3/uL (ref 0.0–0.5)
Eosinophils Relative: 1 %
HCT: 48.3 % (ref 39.0–52.0)
Hemoglobin: 16.3 g/dL (ref 13.0–17.0)
Immature Granulocytes: 1 %
Lymphocytes Relative: 7 %
Lymphs Abs: 1 10*3/uL (ref 0.7–4.0)
MCH: 31.1 pg (ref 26.0–34.0)
MCHC: 33.7 g/dL (ref 30.0–36.0)
MCV: 92.2 fL (ref 80.0–100.0)
Monocytes Absolute: 0.9 10*3/uL (ref 0.1–1.0)
Monocytes Relative: 7 %
Neutro Abs: 11.3 10*3/uL — ABNORMAL HIGH (ref 1.7–7.7)
Neutrophils Relative %: 84 %
Platelets: 229 10*3/uL (ref 150–400)
RBC: 5.24 MIL/uL (ref 4.22–5.81)
RDW: 12.9 % (ref 11.5–15.5)
WBC: 13.4 10*3/uL — ABNORMAL HIGH (ref 4.0–10.5)
nRBC: 0 % (ref 0.0–0.2)

## 2021-10-14 LAB — COMPREHENSIVE METABOLIC PANEL
ALT: 27 U/L (ref 0–44)
AST: 24 U/L (ref 15–41)
Albumin: 4.1 g/dL (ref 3.5–5.0)
Alkaline Phosphatase: 72 U/L (ref 38–126)
Anion gap: 10 (ref 5–15)
BUN: 27 mg/dL — ABNORMAL HIGH (ref 8–23)
CO2: 20 mmol/L — ABNORMAL LOW (ref 22–32)
Calcium: 9.3 mg/dL (ref 8.9–10.3)
Chloride: 110 mmol/L (ref 98–111)
Creatinine, Ser: 1.39 mg/dL — ABNORMAL HIGH (ref 0.61–1.24)
GFR, Estimated: 56 mL/min — ABNORMAL LOW (ref >60)
Glucose, Bld: 100 mg/dL — ABNORMAL HIGH (ref 70–99)
Potassium: 4.1 mmol/L (ref 3.5–5.1)
Sodium: 140 mmol/L (ref 135–145)
Total Bilirubin: 1 mg/dL (ref 0.3–1.2)
Total Protein: 7 g/dL (ref 6.5–8.1)

## 2021-10-14 MED ORDER — METHOCARBAMOL 1000 MG/10ML IJ SOLN
500.0000 mg | Freq: Once | INTRAVENOUS | Status: AC
  Administered 2021-10-14: 500 mg via INTRAVENOUS
  Filled 2021-10-14: qty 5

## 2021-10-14 MED ORDER — DEXAMETHASONE SODIUM PHOSPHATE 10 MG/ML IJ SOLN
10.0000 mg | Freq: Once | INTRAMUSCULAR | Status: AC
  Administered 2021-10-14: 10 mg via INTRAVENOUS
  Filled 2021-10-14: qty 1

## 2021-10-14 MED ORDER — ONDANSETRON HCL 4 MG/2ML IJ SOLN
4.0000 mg | Freq: Once | INTRAMUSCULAR | Status: AC
  Administered 2021-10-14: 4 mg via INTRAVENOUS
  Filled 2021-10-14: qty 2

## 2021-10-14 MED ORDER — HYDROMORPHONE HCL 1 MG/ML IJ SOLN
1.0000 mg | Freq: Once | INTRAMUSCULAR | Status: AC
  Administered 2021-10-14: 1 mg via INTRAVENOUS
  Filled 2021-10-14: qty 1

## 2021-10-14 NOTE — ED Triage Notes (Signed)
Patient BIB [facility] from home w/ complaints of severe lower bilateral back pain that has been ongoing today w/ pain shooting to the lower L leg. Per EMS they found the patient standing up against the wall- he was trying to make it to the bathroom, but unable to because of the severe back pain. Patient was initially diaphoretic on contact due the pain.   VS: BP:146/90 CBG:109 SpO2-99% HR- 70  200 mcg fentanyl given PTA - this did help the pain.

## 2021-10-14 NOTE — ED Notes (Signed)
Admit provider at bedside 

## 2021-10-14 NOTE — ED Notes (Signed)
Pt requesting something to drink at this time. Provider sent a message reference same

## 2021-10-14 NOTE — ED Provider Notes (Signed)
[facility] EMER[facility]ENCY DEPARTMENT Provider Note   CSN: [id number] Arrival date & time: 10/14/21  1805     History {Add pertinent medical, surgical, social history, OB history to HPI:1} Chief Complaint  Patient presents with   Back Pain    Shawn Elliott is a 66 y.o. male.  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 States that he has had back pain in the past for which he has seen a Restaurant manager, fast food. Was bending down to weeks ago to pick up a pair of socks when pain was sudden in onset and severe; "it brought me to my knees". Pain gradually improved, but never resolved. Two days ago pain acutely worsened after bending over. Patient reports pain has remained severe and constant. Pain located in the left low back with radiation down the left leg. Pain aggravated by any degree of movement. He has not taken anything for his symptoms. Denies fever, abdominal pain, upper back or neck pain, bowel/bladder incontinence, genital or perianal numbness, or numbness/paresthesias/weakness of BLE. No hx of CA.  The history is provided by the patient.  Back Pain      Home Medications Prior to Admission medications   Medication Sig Start Date End Date Taking? Authorizing Provider  apixaban (ELIQUIS) 5 MG TABS tablet Take 1 tablet (5 mg total) by mouth 2 (two) times daily. 07/01/20 07/31/20  Vickie Epley, MD  atorvastatin (LIPITOR) 80 MG tablet Take 80 mg by mouth daily.    [provider]  cholecalciferol (VITAMIN D3) 25 MCG (1000 UNIT) tablet Take 1,000 Units by mouth daily.    [provider]  Coenzyme Q10-Vitamin E (QUNOL ULTRA COQ10) 100-150 MG-UNIT CAPS Take 1 tablet by mouth at bedtime.    [provider]  ezetimibe (ZETIA) 10 MG tablet Take 10 mg by mouth daily.    [provider]  fluocinonide cream  (LIDEX) 2.68 % Apply 1 application topically daily as needed (topical allergic reactions/itching).  Patient not taking: Reported on 06/30/2020 09/08/19   [provider]  metoprolol succinate (TOPROL-XL) 100 MG 24 hr tablet Take 100 mg by mouth daily. Take with or immediately following a meal.    [provider]  Multiple Vitamin (MULTIVITAMIN WITH MINERALS) TABS tablet Take 1 tablet by mouth daily.    [provider]  nitroGLYCERIN (NITROSTAT) 0.4 MG SL tablet Place 1 tablet (0.4 mg total) under the tongue every 5 (five) minutes as needed for chest pain. 02/02/16   Wende Bushy, MD  Omega-3 Fatty Acids (FISH OIL TRIPLE STRENGTH) 1400 MG CAPS Take 1,400 mg by mouth in the morning and at bedtime.    [provider]  omeprazole (PRILOSEC) 20 MG capsule Take 20 mg by mouth daily. 12/04/15   [provider]  sacubitril-valsartan (ENTRESTO) 24-26 MG Take 1 tablet by mouth 2 (two) times daily. 05/27/20   Minus Breeding, MD  spironolactone (ALDACTONE) 25 MG tablet Take 25 mg by mouth daily.    [provider]      Allergies    Penicillins    Review of Systems   Review of Systems  Musculoskeletal:  Positive for back pain.  Ten systems reviewed and are negative for acute change, except as noted in the HPI.    Physical Exam Updated Vital Signs BP (!) 141/70   Pulse 61  Temp 97.7 F (36.5 C) (Oral)   Resp 18   Ht 6\' 2"  (1.88 m)   Wt 111.1 kg   SpO2 95%   BMI 31.46 kg/m   Physical Exam Vitals and nursing note reviewed.  Constitutional:      General: He is not in acute distress.    Appearance: He is well-developed. He is not diaphoretic.     Comments: Appears uncomfortable, but nontoxic.  HENT:     Head: Normocephalic and atraumatic.  Eyes:     General: No scleral icterus.    Conjunctiva/sclera: Conjunctivae normal.  Cardiovascular:     Rate and Rhythm: Normal rate and regular rhythm.     Pulses: Normal pulses.     Comments: DP  pulse 2+ in bilateral lower extremities Pulmonary:     Effort: Pulmonary effort is normal. No respiratory distress.     Comments: Respirations even and unlabored Musculoskeletal:     Cervical back: Normal range of motion.     Comments: Exam limited secondary to pain.  Will reassess when pain is better controlled.  Skin:    General: Skin is warm and dry.     Coloration: Skin is not pale.     Findings: No erythema or rash.  Neurological:     Mental Status: He is alert and oriented to person, place, and time.     Coordination: Coordination normal.     Comments: Sensation intact and equal in BLE. Normal dorsiflexion and plantarflexion against resistance in bilateral lower extremities.  Psychiatric:        Behavior: Behavior normal.     ED Results / Procedures / Treatments   Labs (all labs ordered are listed, but only abnormal results are displayed) Labs Reviewed  COMPREHENSIVE METABOLIC PANEL - Abnormal; Notable for the following components:      Result Value   CO2 20 (*)    Glucose, Bld 100 (*)    BUN 27 (*)    Creatinine, Ser 1.39 (*)    GFR, Estimated 56 (*)    All other components within normal limits  CBC WITH DIFFERENTIAL/PLATELET - Abnormal; Notable for the following components:   WBC 13.4 (*)    Neutro Abs 11.3 (*)    Abs Immature Granulocytes 0.09 (*)    All other components within normal limits  URINALYSIS, ROUTINE W REFLEX MICROSCOPIC    EKG EKG Interpretation  Date/Time:  Saturday October 14 2021 22:25:11 EDT Ventricular Rate:  76 PR Interval:  170 QRS Duration: 132 QT Interval:  453 QTC Calculation: 510 R Axis:   87 Text Interpretation: Sinus rhythm Biatrial enlargement Nonspecific intraventricular conduction delay Inferior infarct, age indeterminate Probable anterolateral infarct, old Confirmed by 06-12-1988 (Zadie Rhine) on 10/14/2021 11:09:25 PM  Radiology No results found.  Procedures Procedures  {Document cardiac monitor, telemetry assessment  procedure when appropriate:1}  Medications Ordered in ED Medications  methocarbamol (ROBAXIN) 500 mg in dextrose 5 % 50 mL IVPB (has no administration in time range)  HYDROmorphone (DILAUDID) injection 1 mg (1 mg Intravenous Given 10/14/21 2345)  ondansetron (ZOFRAN) injection 4 mg (4 mg Intravenous Given 10/14/21 2346)  dexamethasone (DECADRON) injection 10 mg (10 mg Intravenous Given 10/14/21 2346)    ED Course/ Medical Decision Making/ A&P Clinical Course as of 10/14/21 2348  Sat Oct 14, 2021  2339 Patient with [employer] ICD. Per Cardiology note in 02/2020 he is a sensed V sensed 25% of the time and otherwise he is a paced V sensed. [KH]  2345 Spoke with  Dr. Piedad Climes of Cardiology who confirms need to have [employer] rep present to change ICD to MRI compatibility mode in order to complete imaging. [KH]    Clinical Course User Index [KH] Antony Madura, PA-C                           Medical Decision Making Amount and/or Complexity of Data Reviewed Labs: ordered.  Risk Prescription drug management.   ***  {Document critical care time when appropriate:1} {Document review of labs and clinical decision tools ie heart score, Chads2Vasc2 etc:1}  {Document your independent review of radiology images, and any outside records:1} {Document your discussion with family members, caretakers, and with consultants:1} {Document social determinants of health affecting pt's care:1} {Document your decision making why or why not admission, treatments were needed:1} Final Clinical Impression(s) / ED Diagnoses Final diagnoses:  None    Rx / DC Orders ED Discharge Orders     None

## 2021-10-14 NOTE — ED Notes (Signed)
Received verbal report from Bailey B RN at this time 

## 2021-10-15 ENCOUNTER — Telehealth (HOSPITAL_COMMUNITY): Payer: Self-pay | Admitting: Emergency Medicine

## 2021-10-15 LAB — URINALYSIS, ROUTINE W REFLEX MICROSCOPIC
Bacteria, UA: NONE SEEN
Bilirubin Urine: NEGATIVE
Glucose, UA: >=500 mg/dL — AB
Hgb urine dipstick: NEGATIVE
Ketones, ur: 5 mg/dL — AB
Leukocytes,Ua: NEGATIVE
Nitrite: NEGATIVE
Protein, ur: NEGATIVE mg/dL
Specific Gravity, Urine: 1.032 — ABNORMAL HIGH (ref 1.005–1.030)
pH: 5 (ref 5.0–8.0)

## 2021-10-15 MED ORDER — OXYCODONE-ACETAMINOPHEN 5-325 MG PO TABS
2.0000 | ORAL_TABLET | Freq: Once | ORAL | Status: AC
  Administered 2021-10-15: 2 via ORAL
  Filled 2021-10-15: qty 2

## 2021-10-15 MED ORDER — KETOROLAC TROMETHAMINE 15 MG/ML IJ SOLN
15.0000 mg | Freq: Once | INTRAMUSCULAR | Status: AC
  Administered 2021-10-15: 15 mg via INTRAVENOUS
  Filled 2021-10-15: qty 1

## 2021-10-15 MED ORDER — OXYCODONE-ACETAMINOPHEN 5-325 MG PO TABS: 1.0000 | ORAL_TABLET | Freq: Four times a day (QID) | ORAL | 0 refills | Status: DC | PRN

## 2021-10-15 MED ORDER — OXYCODONE-ACETAMINOPHEN 5-325 MG PO TABS: 1.0000 | ORAL_TABLET | Freq: Four times a day (QID) | ORAL | 0 refills | Status: AC | PRN

## 2021-10-15 MED ORDER — PREDNISONE 20 MG PO TABS: 40.0000 mg | ORAL_TABLET | Freq: Every day | ORAL | 0 refills | Status: AC

## 2021-10-15 MED ORDER — METHOCARBAMOL 500 MG PO TABS: 500.0000 mg | ORAL_TABLET | Freq: Two times a day (BID) | ORAL | 0 refills | Status: AC | PRN

## 2021-10-15 NOTE — ED Notes (Signed)
Provider at bedside

## 2021-10-15 NOTE — Discharge Instructions (Addendum)
Alternate ice and heat to areas of injury 3-4 times per day to limit inflammation and spasm.  Avoid strenuous activity and heavy lifting.  Take prednisone as prescribed.  You have been prescribed Percocet to take as needed for severe pain.  Do not drive or drink alcohol after taking this medication as it may make you drowsy and impair your judgment.  We recommend follow-up with a primary care doctor to ensure resolution of symptoms.  Return to the ED for any new or concerning symptoms.

## 2021-10-15 NOTE — ED Notes (Signed)
Pt up ambulating in the hallway. Pt was advised that he couldn't do so. Pt also advised to call someone to get him that we should be d/c him shortly

## 2021-10-15 NOTE — ED Notes (Signed)
Pt ambulatory to the restroom and back to room with no assistance. Pt exhibits slow but steady gait, no complaints at this time.

## 2021-10-15 NOTE — Telephone Encounter (Signed)
Pt's medication was not received at the pharmacy
